# Patient Record
Sex: Female | Born: 1975 | Race: Black or African American | Hispanic: No | Marital: Single | Smoking: Never smoker
Health system: Southern US, Community
[De-identification: ages and names within clinical notes are randomized; demographics above are authoritative.]

## PROBLEM LIST (undated history)

## (undated) DIAGNOSIS — M869 Osteomyelitis, unspecified: Secondary | ICD-10-CM

## (undated) DIAGNOSIS — F329 Major depressive disorder, single episode, unspecified: Secondary | ICD-10-CM

## (undated) DIAGNOSIS — F419 Anxiety disorder, unspecified: Secondary | ICD-10-CM

## (undated) DIAGNOSIS — M722 Plantar fascial fibromatosis: Secondary | ICD-10-CM

## (undated) DIAGNOSIS — Z9889 Other specified postprocedural states: Secondary | ICD-10-CM

## (undated) DIAGNOSIS — T7840XA Allergy, unspecified, initial encounter: Secondary | ICD-10-CM

## (undated) DIAGNOSIS — F1910 Other psychoactive substance abuse, uncomplicated: Secondary | ICD-10-CM

## (undated) DIAGNOSIS — M797 Fibromyalgia: Secondary | ICD-10-CM

## (undated) DIAGNOSIS — F4310 Post-traumatic stress disorder, unspecified: Secondary | ICD-10-CM

## (undated) DIAGNOSIS — M1990 Unspecified osteoarthritis, unspecified site: Secondary | ICD-10-CM

## (undated) DIAGNOSIS — N83209 Unspecified ovarian cyst, unspecified side: Secondary | ICD-10-CM

## (undated) DIAGNOSIS — R112 Nausea with vomiting, unspecified: Secondary | ICD-10-CM

## (undated) DIAGNOSIS — F32A Depression, unspecified: Secondary | ICD-10-CM

## (undated) DIAGNOSIS — K589 Irritable bowel syndrome without diarrhea: Secondary | ICD-10-CM

## (undated) DIAGNOSIS — K219 Gastro-esophageal reflux disease without esophagitis: Secondary | ICD-10-CM

## (undated) DIAGNOSIS — G43909 Migraine, unspecified, not intractable, without status migrainosus: Secondary | ICD-10-CM

## (undated) DIAGNOSIS — D219 Benign neoplasm of connective and other soft tissue, unspecified: Secondary | ICD-10-CM

## (undated) DIAGNOSIS — R011 Cardiac murmur, unspecified: Secondary | ICD-10-CM

## (undated) HISTORY — PX: WISDOM TOOTH EXTRACTION: SHX21

## (undated) HISTORY — DX: Benign neoplasm of connective and other soft tissue, unspecified: D21.9

## (undated) HISTORY — DX: Major depressive disorder, single episode, unspecified: F32.9

## (undated) HISTORY — DX: Fibromyalgia: M79.7

## (undated) HISTORY — PX: ADENOIDECTOMY: SUR15

## (undated) HISTORY — DX: Unspecified ovarian cyst, unspecified side: N83.209

## (undated) HISTORY — DX: Migraine, unspecified, not intractable, without status migrainosus: G43.909

## (undated) HISTORY — DX: Other psychoactive substance abuse, uncomplicated: F19.10

## (undated) HISTORY — DX: Plantar fascial fibromatosis: M72.2

## (undated) HISTORY — DX: Gastro-esophageal reflux disease without esophagitis: K21.9

## (undated) HISTORY — DX: Depression, unspecified: F32.A

## (undated) HISTORY — DX: Anxiety disorder, unspecified: F41.9

## (undated) HISTORY — DX: Post-traumatic stress disorder, unspecified: F43.10

## (undated) HISTORY — DX: Irritable bowel syndrome, unspecified: K58.9

## (undated) HISTORY — DX: Unspecified osteoarthritis, unspecified site: M19.90

## (undated) HISTORY — PX: TONSILLECTOMY: SUR1361

## (undated) HISTORY — DX: Osteomyelitis, unspecified: M86.9

## (undated) HISTORY — DX: Allergy, unspecified, initial encounter: T78.40XA

## (undated) HISTORY — DX: Cardiac murmur, unspecified: R01.1

## (undated) HISTORY — PX: DILATION AND CURETTAGE OF UTERUS: SHX78

---

## 1999-08-06 DIAGNOSIS — N83209 Unspecified ovarian cyst, unspecified side: Secondary | ICD-10-CM

## 1999-08-06 HISTORY — DX: Unspecified ovarian cyst, unspecified side: N83.209

## 2011-10-27 ENCOUNTER — Emergency Department (HOSPITAL_COMMUNITY): Payer: BC Managed Care – PPO

## 2011-10-27 ENCOUNTER — Emergency Department (HOSPITAL_COMMUNITY)
Admission: EM | Admit: 2011-10-27 | Discharge: 2011-10-28 | Disposition: A | Discharge status: Home / Self Care | Payer: BC Managed Care – PPO | Attending: Emergency Medicine | Admitting: Emergency Medicine

## 2011-10-27 ENCOUNTER — Encounter (HOSPITAL_COMMUNITY): Payer: Self-pay | Admitting: *Deleted

## 2011-10-27 DIAGNOSIS — R059 Cough, unspecified: Secondary | ICD-10-CM | POA: Insufficient documentation

## 2011-10-27 DIAGNOSIS — R112 Nausea with vomiting, unspecified: Secondary | ICD-10-CM | POA: Insufficient documentation

## 2011-10-27 DIAGNOSIS — R05 Cough: Secondary | ICD-10-CM | POA: Insufficient documentation

## 2011-10-27 DIAGNOSIS — R0602 Shortness of breath: Secondary | ICD-10-CM | POA: Insufficient documentation

## 2011-10-27 DIAGNOSIS — J40 Bronchitis, not specified as acute or chronic: Secondary | ICD-10-CM

## 2011-10-27 DIAGNOSIS — Z79899 Other long term (current) drug therapy: Secondary | ICD-10-CM | POA: Insufficient documentation

## 2011-10-27 DIAGNOSIS — J45909 Unspecified asthma, uncomplicated: Secondary | ICD-10-CM | POA: Insufficient documentation

## 2011-10-27 MED ORDER — ONDANSETRON 4 MG PO TBDP
4.0000 mg | ORAL_TABLET | Freq: Once | ORAL | Status: DC
  Filled 2011-10-27: qty 1

## 2011-10-27 NOTE — ED Notes (Signed)
Pt states that she missed work on Friday and went to [facility] because she was having N/V/D. Pt waited 5 hours was not seen and she continued to feel bad over the weekend. Pt came back today due to coughing, generalized body aches.

## 2011-10-27 NOTE — ED Provider Notes (Signed)
History     CSN: [id number]  Arrival date & time 10/27/11  2029   First MD Initiated Contact with Patient 10/27/11 2316      Chief Complaint  Patient presents with  . Influenza    (Consider location/radiation/quality/duration/timing/severity/associated sxs/prior treatment) HPI Comments: Patient presents with 4 days of cough, congestion, nausea, vomiting, subjective fevers and chills. Her symptoms started on Friday when she missed work in [employer] [facility], waited 5 hours to be seen in the left. The nausea and vomiting started over the weekend associated with loose stools.  Eyes any abdominal pain or back pain. She does have chest pain with coughing he did not have chest pain without coughing.  Is nonproductive and dry. She has rhinorrhea, sore throat congestion.  The history is provided by the patient.    Past Medical History  Diagnosis Date  . Asthma     Past Surgical History  Procedure Date  . Tonsillectomy     No family history on file.  History  Substance Use Topics  . Smoking status: Not on file  . Smokeless tobacco: Not on file  . Alcohol Use: No    OB History    Grav Para Term Preterm Abortions TAB SAB Ect Mult Living                  Review of Systems  Constitutional: Positive for fever, activity change and fatigue.  HENT: Positive for congestion, sore throat and rhinorrhea.   Respiratory: Positive for cough and chest tightness. Negative for shortness of breath.   Cardiovascular: Positive for chest pain.  Gastrointestinal: Positive for nausea, vomiting and diarrhea. Negative for abdominal pain.  Genitourinary: Negative for dysuria, vaginal bleeding and vaginal discharge.  Musculoskeletal: Negative for back pain.  Neurological: Negative for headaches.    Allergies  Doxycycline; Latex; and Penicillins  Home Medications   Current Outpatient Rx  Name Route Sig Dispense Refill  . ALBUTEROL SULFATE HFA 108 (90 BASE) MCG/ACT IN AERS Inhalation Inhale 2  puffs into the lungs every 6 (six) hours as needed.    Marland Kitchen ESOMEPRAZOLE MAGNESIUM 40 MG PO CPDR Oral Take 40 mg by mouth daily before breakfast.    . ALBUTEROL SULFATE HFA 108 (90 BASE) MCG/ACT IN AERS Inhalation Inhale 1-2 puffs into the lungs every 6 (six) hours as needed for wheezing. 1 Inhaler 0  . AZITHROMYCIN 250 MG PO TABS Oral Take 1 tablet (250 mg total) by mouth daily. Take first 2 tablets together, then 1 every day until finished. 6 tablet 0    BP 98/61  Pulse 105  Temp(Src) 98.4 F (36.9 C) (Oral)  Resp 16  SpO2 100%  LMP 10/13/2011  Physical Exam  Constitutional: She is oriented to person, place, and time. She appears well-developed and well-nourished. No distress.  HENT:  Head: Normocephalic and atraumatic.  Right Ear: External ear normal.  Left Ear: External ear normal.  Mouth/Throat: Oropharynx is clear and moist. No oropharyngeal exudate.  Eyes: Conjunctivae are normal. Pupils are equal, round, and reactive to light.  Neck: Normal range of motion.       No meningismus  Cardiovascular: Normal rate, regular rhythm and normal heart sounds.   No murmur heard. Pulmonary/Chest: Effort normal and breath sounds normal. No respiratory distress. She has no wheezes.  Abdominal: Soft. There is no tenderness. There is no rebound and no guarding.  Musculoskeletal: Normal range of motion. She exhibits no edema and no tenderness.  Neurological: She is alert and oriented to person,  place, and time. No cranial nerve deficit.  Skin: Skin is warm.    ED Course  Procedures (including critical care time)  Labs Reviewed - No data to display Dg Chest 2 View  10/27/2011  *RADIOLOGY REPORT*  Clinical Data: Cough, shortness of breath and chest pain.  CHEST - 2 VIEW  Comparison: None.  Findings: The lungs are well-aerated and clear.  There is no evidence of focal opacification, pleural effusion or pneumothorax.  The heart is normal in size; the mediastinal contour is within normal limits.   No acute osseous abnormalities are seen.  IMPRESSION: No acute cardiopulmonary process seen.  Original Report Authenticated By: Tonia Ghent, M.D.     1. Bronchitis       MDM  Viral syndrome with cough, congestion, nausea, vomiting.  Nontoxic on exam. No current chest pain or SOB.  Xray negative, lungs clear.  Will treat for bronchitis.        Glynn Octave, MD 10/28/11 [phone]

## 2011-10-27 NOTE — ED Notes (Signed)
Pt has been feeling ill since Wednesday.  Pt has been having congestion, nausea and vomiting and feeling tired.  Pt has been having fever and chills

## 2011-10-28 MED ORDER — ALBUTEROL SULFATE HFA 108 (90 BASE) MCG/ACT IN AERS: 1.0000 | INHALATION_SPRAY | Freq: Four times a day (QID) | RESPIRATORY_TRACT | Status: DC | PRN

## 2011-10-28 MED ORDER — AZITHROMYCIN 250 MG PO TABS: 250.0000 mg | ORAL_TABLET | Freq: Every day | ORAL | Status: AC

## 2011-10-28 NOTE — Discharge Instructions (Signed)
 Bronchitis Bronchitis is the body's way of reacting to injury and/or infection (inflammation) of the bronchi. Bronchi are the air tubes that extend from the windpipe into the lungs. If the inflammation becomes severe, it may cause shortness of breath. CAUSES  Inflammation may be caused by:  A virus.   Germs (bacteria).   Dust.   Allergens.   Pollutants and many other irritants.  The cells lining the bronchial tree are covered with tiny hairs (cilia). These constantly beat upward, away from the lungs, toward the mouth. This keeps the lungs free of pollutants. When these cells become too irritated and are unable to do their job, mucus begins to develop. This causes the characteristic cough of bronchitis. The cough clears the lungs when the cilia are unable to do their job. Without either of these protective mechanisms, the mucus would settle in the lungs. Then you would develop pneumonia. Smoking is a common cause of bronchitis and can contribute to pneumonia. Stopping this habit is the single most important thing you can do to help yourself. TREATMENT   Your caregiver may prescribe an antibiotic if the cough is caused by bacteria. Also, medicines that open up your airways make it easier to breathe. Your caregiver may also recommend or prescribe an expectorant. It will loosen the mucus to be coughed up. Only take over-the-counter or prescription medicines for pain, discomfort, or fever as directed by your caregiver.   Removing whatever causes the problem (smoking, for example) is critical to preventing the problem from getting worse.   Cough suppressants may be prescribed for relief of cough symptoms.   Inhaled medicines may be prescribed to help with symptoms now and to help prevent problems from returning.   For those with recurrent (chronic) bronchitis, there may be a need for steroid medicines.  SEEK IMMEDIATE MEDICAL CARE IF:   During treatment, you develop more pus-like mucus  (purulent sputum).   You have a fever.   Your baby is older than 3 months with a rectal temperature of 102 F (38.9 C) or higher.   Your baby is 51 months old or younger with a rectal temperature of 100.4 F (38 C) or higher.   You become progressively more ill.   You have increased difficulty breathing, wheezing, or shortness of breath.  It is necessary to seek immediate medical care if you are elderly or sick from any other disease. MAKE SURE YOU:   Understand these instructions.   Will watch your condition.   Will get help right away if you are not doing well or get worse.  Document Released: 07/22/2005 Document Revised: 07/11/2011 Document Reviewed: 05/31/2008 [facility] Patient Information 2012 [place], [place].

## 2011-10-28 NOTE — ED Notes (Signed)
Pt able to tolerate fluids. Pt feels better and ready to leave.

## 2012-01-07 ENCOUNTER — Ambulatory Visit: Payer: BC Managed Care – PPO | Admitting: Obstetrics and Gynecology

## 2012-01-07 ENCOUNTER — Encounter: Payer: BC Managed Care – PPO | Admitting: Obstetrics and Gynecology

## 2012-01-17 ENCOUNTER — Ambulatory Visit (INDEPENDENT_AMBULATORY_CARE_PROVIDER_SITE_OTHER): Payer: BC Managed Care – PPO | Admitting: Obstetrics and Gynecology

## 2012-01-17 ENCOUNTER — Encounter: Payer: Self-pay | Admitting: Obstetrics and Gynecology

## 2012-01-17 VITALS — BP 110/78 | HR 80 | Ht 66.0 in | Wt 200.0 lb

## 2012-01-17 DIAGNOSIS — K589 Irritable bowel syndrome without diarrhea: Secondary | ICD-10-CM | POA: Insufficient documentation

## 2012-01-17 DIAGNOSIS — N949 Unspecified condition associated with female genital organs and menstrual cycle: Secondary | ICD-10-CM

## 2012-01-17 DIAGNOSIS — Z113 Encounter for screening for infections with a predominantly sexual mode of transmission: Secondary | ICD-10-CM

## 2012-01-17 DIAGNOSIS — G43909 Migraine, unspecified, not intractable, without status migrainosus: Secondary | ICD-10-CM | POA: Insufficient documentation

## 2012-01-17 DIAGNOSIS — K219 Gastro-esophageal reflux disease without esophagitis: Secondary | ICD-10-CM | POA: Insufficient documentation

## 2012-01-17 DIAGNOSIS — N83209 Unspecified ovarian cyst, unspecified side: Secondary | ICD-10-CM | POA: Insufficient documentation

## 2012-01-17 DIAGNOSIS — F4310 Post-traumatic stress disorder, unspecified: Secondary | ICD-10-CM

## 2012-01-17 DIAGNOSIS — Z124 Encounter for screening for malignant neoplasm of cervix: Secondary | ICD-10-CM

## 2012-01-17 DIAGNOSIS — F32A Depression, unspecified: Secondary | ICD-10-CM | POA: Insufficient documentation

## 2012-01-17 DIAGNOSIS — N946 Dysmenorrhea, unspecified: Secondary | ICD-10-CM

## 2012-01-17 DIAGNOSIS — F329 Major depressive disorder, single episode, unspecified: Secondary | ICD-10-CM | POA: Insufficient documentation

## 2012-01-17 DIAGNOSIS — IMO0002 Reserved for concepts with insufficient information to code with codable children: Secondary | ICD-10-CM

## 2012-01-17 DIAGNOSIS — N898 Other specified noninflammatory disorders of vagina: Secondary | ICD-10-CM

## 2012-01-17 DIAGNOSIS — Z01419 Encounter for gynecological examination (general) (routine) without abnormal findings: Secondary | ICD-10-CM

## 2012-01-17 DIAGNOSIS — N943 Premenstrual tension syndrome: Secondary | ICD-10-CM

## 2012-01-17 LAB — POCT WET PREP (WET MOUNT): Whiff Test: NEGATIVE

## 2012-01-17 LAB — POCT URINE PREGNANCY: Preg Test, Ur: NEGATIVE

## 2012-01-17 NOTE — Progress Notes (Signed)
Color: WHITE Odor: no Itching:yes Thin:no Thick:yes Fever:no Dyspareunia:no Hx PID:no HX YQM:VHQIONGE TRICH Pelvic Pain:no Desires Gc/CT:yes Desires HIV,RPR,HbsAG:yes

## 2012-01-17 NOTE — Progress Notes (Signed)
Regular Periods: yes Mammogram: no  Monthly Breast Ex.: yes Exercise: no  Tetanus < 10 years: no Seatbelts: yes  NI. Bladder Functn.: yes Abuse at home: no  Daily BM's: yes Stressful Work: yes  Healthy Diet: yes Sigmoid-Colonoscopy: 2004   Calcium: no Medical problems this year: VAGINAL DISCHARGE AND DISCOMFORT WITH I/C   LAST [id number]  Contraception: CONDOMS  Mammogram:  NO  PCP: NO  PMH: NOCHANGE  FMH: NOCHANGE  Last Bone Scan: NO

## 2012-01-17 NOTE — Patient Instructions (Signed)
Avoid: - excess soap on genital area (consider using plain oatmeal soap) - use of powder or sprays in genital area - douching - wearing underwear to bed (except with menses) - using more than is directed detergent when washing clothes - tight fitting garments around genital area - excess sugar intake   

## 2012-01-17 NOTE — Progress Notes (Signed)
Subjective:    Carrieann Spielberg is a 36 y.o. female, G2P0020, who presents for an annual exam. The patient reports dyspareunia, primarily on the left side over the last 4 months and heavy vaginal discharge for one month without odor or itching. Pain is pressure-like and also occurs with menses.  Patient wants STD testing. Lastly, patient will have menstrual cramping starting 1 week before menses rated as a 10/10 but will decrease to 5/10 with Ibuprofen 800 mg. Since her D & C in 04/2011 her periods have been lighter (2 tampons daily) but cramps have been worse. PMS symptoms include fatigue, breast tenderness, nausea, vomiting and headache.  Menstrual cycle:   LMP: Patient's last menstrual period was 12/20/2011.  Flow 5 days with tampon change 2/day with 10/10 cramps relieved by Ibuprofen 800 mg (5/10)             Review of Systems Pertinent items are noted in HPI. Denies pelvic pain, urinary tract symptoms, vaginitis symptoms, irregular bleeding, menopausal symptoms, change in bowel habits or rectal bleeding   Objective:    BP 110/78  Pulse 80  Ht 5\' 6"  (1.676 m)  Wt 200 lb (90.719 kg)  BMI 32.28 kg/m2  LMP 12/20/2011  Wt Readings from Last 1 Encounters:  01/17/12 200 lb (90.719 kg)   Body mass index is 32.28 kg/(m^2). General Appearance: Alert, no acute distress HEENT: Grossly normal Neck / Thyroid: Supple, no thyromegaly or cervical adenopathy Lungs: Clear to auscultation bilaterally Back: No CVA tenderness Breast Exam: No masses or nodes.No dimpling, nipple retraction or discharge. Cardiovascular: Regular rate and rhythm.  Gastrointestinal: Soft, non-tender, no masses or organomegaly Pelvic Exam: EGBUS-wnl, vagina-normal rugae, menstrual blood, cervix- without lesions or tenderness, uterus appears normal size shape and consistency, adnexae-no masses or tenderness Lymphatic Exam: Non-palpable nodes in neck, clavicular,  axillary, or inguinal regions  Skin: no rashes or  abnormalities Extremities: no clubbing cyanosis or edema  Neurologic: grossly normal Psychiatric: Alert and oriented  Wet Prep: pH 5.5, whiff-negative, no clue, trich or yeast UPT: negative   Assessment:   Routine GYN Exam Dyspareunia PMS Dysmenorrhea   Plan:  STD testing  Pelvic ultrasound  PAP sent  Considering a career change with or without school  RTO 1 year or prn  Joenathan Sakuma,ELMIRAPA-C

## 2012-01-21 LAB — PAP IG, CT-NG, RFX HPV ASCU

## 2012-02-12 ENCOUNTER — Encounter: Payer: BC Managed Care – PPO | Admitting: Obstetrics and Gynecology

## 2012-02-12 ENCOUNTER — Other Ambulatory Visit: Payer: BC Managed Care – PPO

## 2012-02-17 ENCOUNTER — Encounter: Payer: BC Managed Care – PPO | Admitting: Obstetrics and Gynecology

## 2012-02-17 ENCOUNTER — Other Ambulatory Visit: Payer: BC Managed Care – PPO

## 2014-06-06 ENCOUNTER — Encounter: Payer: Self-pay | Admitting: Obstetrics and Gynecology

## 2014-08-05 HISTORY — PX: TUBAL LIGATION: SHX77

## 2015-11-01 ENCOUNTER — Ambulatory Visit: Payer: Self-pay | Admitting: Obstetrics and Gynecology

## 2015-11-01 NOTE — Progress Notes (Signed)
Patient ID: [facility], female   DOB: [birth year], 40 y.o.   MRN: [id number] Cancelled appt

## 2015-11-03 NOTE — Progress Notes (Signed)
Patient cancelled appt.

## 2016-08-15 ENCOUNTER — Encounter: Payer: Self-pay | Admitting: Obstetrics and Gynecology

## 2016-08-15 ENCOUNTER — Ambulatory Visit (INDEPENDENT_AMBULATORY_CARE_PROVIDER_SITE_OTHER): Payer: Medicaid Other | Admitting: Obstetrics and Gynecology

## 2016-08-15 VITALS — BP 141/88 | Wt 184.0 lb

## 2016-08-15 DIAGNOSIS — N938 Other specified abnormal uterine and vaginal bleeding: Secondary | ICD-10-CM

## 2016-08-15 DIAGNOSIS — N920 Excessive and frequent menstruation with regular cycle: Secondary | ICD-10-CM

## 2016-08-15 NOTE — Progress Notes (Signed)
   [facility] Visit  08/15/16            Patient name: Brooke Eaton MRN [id number]  Date of birth: 01/23/1976  CC & HPI:  Brooke Eaton is a 41 y.o. female presenting today for AUB and menorrhagia. Pt notes that she had a miscarriage in 2012 with a subsequent D&C. Pt has had a BTL and since the procedure her menstrual cycles have been 7 days which is atypical for her. Pt has tried OCP with no relief of her symptoms. Pt denies any other symptoms.   ROS:  ROS  +AUB +menorrhagia   Pertinent History Reviewed:   Reviewed: Significant for fibroid Medical         Past Medical History:  Diagnosis Date  . Acid reflux   . Anxiety and depression   . Asthma   . Fibroid   . Irritable bowel syndrome   . Migraines   . Post-traumatic stress disorder   . PTSD (post-traumatic stress disorder)   . Ruptured cyst of ovary 2001                              Surgical Hx:    Past Surgical History:  Procedure Laterality Date  . ADENOIDECTOMY    . DILATION AND CURETTAGE OF UTERUS    . TONSILLECTOMY    . WISDOM TOOTH EXTRACTION     Medications: Reviewed & Updated - see associated section                       Current Outpatient Prescriptions:  .  albuterol (PROVENTIL HFA;VENTOLIN HFA) 108 (90 BASE) MCG/ACT inhaler, Inhale 2 puffs into the lungs every 6 (six) hours as needed., Disp: , Rfl:  .  esomeprazole (NEXIUM) 40 MG capsule, Take 40 mg by mouth daily before breakfast., Disp: , Rfl:    Social History: Reviewed -  reports that she has never smoked. She has never used smokeless tobacco.  Objective Findings:  Vitals: Blood pressure (!) 141/88, weight 184 lb (83.5 kg), last menstrual period 08/05/2016.  Physical Examination: Pelvic - normal external genitalia, vulva, vagina, cervix, uterus and adnexa,  VULVA: normal appearing vulva with no masses, tenderness or lesions,  VAGINA: normal appearing vagina with normal color and discharge, no lesions,  CERVIX: normal appearing cervix without  discharge or lesions,  UTERUS: uterus is normal size, shape, consistency and nontender,  ADNEXA: normal adnexa in size, nontender and no masses   Assessment & Plan:   A:  1. AUB 2. Menorrhagia  P:  1. Schedule [place] in 1 week, may be interested in Endometrial ablation   By signing my name below, I, Soijett Blue, attest that this documentation has been prepared under the direction and in the presence of Jonnie Kind, MD. Electronically Signed: [place], ED Scribe. 08/15/16. 11:05 AM.  I personally performed the services described in this documentation, which was SCRIBED in my presence. The recorded information has been reviewed and considered accurate. It has been edited as necessary during review. Jonnie Kind, MD

## 2016-08-16 DIAGNOSIS — N920 Excessive and frequent menstruation with regular cycle: Secondary | ICD-10-CM | POA: Insufficient documentation

## 2016-08-23 ENCOUNTER — Other Ambulatory Visit: Payer: Self-pay | Admitting: Obstetrics and Gynecology

## 2016-08-23 ENCOUNTER — Ambulatory Visit (INDEPENDENT_AMBULATORY_CARE_PROVIDER_SITE_OTHER): Payer: Medicaid Other

## 2016-08-23 DIAGNOSIS — N854 Malposition of uterus: Secondary | ICD-10-CM

## 2016-08-23 DIAGNOSIS — N921 Excessive and frequent menstruation with irregular cycle: Secondary | ICD-10-CM

## 2016-08-23 DIAGNOSIS — D250 Submucous leiomyoma of uterus: Secondary | ICD-10-CM

## 2016-08-23 NOTE — Progress Notes (Signed)
PELVIC US TA/TV: retroverted heterogenous uterus,posterior submucosal fibroid distorting the endometrium 1.8 x 1 x 1.8 cm,the fundus is heterogenous,but no discrete mass seen,normal ov's bilat,small amount of simple cul de sac fluid ,left adnexal pain during ultrasound,ovaries appear mobile

## 2016-08-28 ENCOUNTER — Encounter: Payer: Self-pay | Admitting: Obstetrics and Gynecology

## 2016-08-28 ENCOUNTER — Ambulatory Visit (INDEPENDENT_AMBULATORY_CARE_PROVIDER_SITE_OTHER): Payer: Medicaid Other | Admitting: Obstetrics and Gynecology

## 2016-08-28 VITALS — BP 114/80 | HR 80 | Wt 178.8 lb

## 2016-08-28 DIAGNOSIS — N920 Excessive and frequent menstruation with regular cycle: Secondary | ICD-10-CM

## 2016-08-28 DIAGNOSIS — D259 Leiomyoma of uterus, unspecified: Secondary | ICD-10-CM

## 2016-08-28 NOTE — Progress Notes (Signed)
   [facility] Visit  08/28/16          Patient name: Brooke [facility] MRN [id number]  Date of birth: 02-Apr-1976  CC & HPI:  Brooke Eaton is a 41 y.o. female presenting today for follow up for [place] results. Patient is interested in endometrial ablation for fibroid management. She states she initially had 3 day periods but now has 7 day periods since having a BTL. She has tried OCP without relief.   ROS:  ROS +menorrhagia   Pertinent History Reviewed:   Reviewed: Significant for fibroid Medical         Past Medical History:  Diagnosis Date  . Acid reflux   . Anxiety and depression   . Asthma   . Fibroid   . Irritable bowel syndrome   . Migraines   . Post-traumatic stress disorder   . PTSD (post-traumatic stress disorder)   . Ruptured cyst of ovary 2001                              Surgical Hx:    Past Surgical History:  Procedure Laterality Date  . ADENOIDECTOMY    . DILATION AND CURETTAGE OF UTERUS    . TONSILLECTOMY    . WISDOM TOOTH EXTRACTION     Medications: Reviewed & Updated - see associated section                       Current Outpatient Prescriptions:  .  albuterol (PROVENTIL HFA;VENTOLIN HFA) 108 (90 BASE) MCG/ACT inhaler, Inhale 2 puffs into the lungs every 6 (six) hours as needed., Disp: , Rfl:  .  esomeprazole (NEXIUM) 40 MG capsule, Take 40 mg by mouth daily before breakfast., Disp: , Rfl:    Social History: Reviewed -  reports that she has never smoked. She has never used smokeless tobacco.  Objective Findings:  Vitals: Blood pressure 114/80, pulse 80, weight 178 lb 12.8 oz (81.1 kg), last menstrual period 08/05/2016.  Physical Examination: not indicated, discussion only   Assessment & Plan:   A:  1. Menorrhagia 2. Small submucosal fibroid, less than 3 cm 3. She is considering ablation vs IUD  P:  1. Follow up ~2 weeks for either pre-op or IUD placement depending on what patient decides    By signing my name below, I, Sonum Patel, attest  that this documentation has been prepared under the direction and in the presence of Jonnie Kind, MD. Electronically Signed: Sonum Patel, Education administrator. 08/28/16. 4:00 PM.  I personally performed the services described in this documentation, which was SCRIBED in my presence. The recorded information has been reviewed and considered accurate. It has been edited as necessary during review. Jonnie Kind, MD

## 2016-09-02 ENCOUNTER — Ambulatory Visit (INDEPENDENT_AMBULATORY_CARE_PROVIDER_SITE_OTHER): Payer: Medicaid Other | Admitting: Family Medicine

## 2016-09-02 ENCOUNTER — Encounter: Payer: Self-pay | Admitting: Family Medicine

## 2016-09-02 ENCOUNTER — Telehealth: Payer: Self-pay | Admitting: Obstetrics and Gynecology

## 2016-09-02 ENCOUNTER — Telehealth: Payer: Self-pay | Admitting: *Deleted

## 2016-09-02 VITALS — BP 120/78 | HR 80 | Temp 99.0°F | Resp 18 | Ht 66.0 in | Wt 182.0 lb

## 2016-09-02 DIAGNOSIS — F1290 Cannabis use, unspecified, uncomplicated: Secondary | ICD-10-CM

## 2016-09-02 DIAGNOSIS — F419 Anxiety disorder, unspecified: Secondary | ICD-10-CM

## 2016-09-02 DIAGNOSIS — F32A Depression, unspecified: Secondary | ICD-10-CM

## 2016-09-02 DIAGNOSIS — F329 Major depressive disorder, single episode, unspecified: Secondary | ICD-10-CM

## 2016-09-02 DIAGNOSIS — F418 Other specified anxiety disorders: Secondary | ICD-10-CM | POA: Diagnosis not present

## 2016-09-02 DIAGNOSIS — Z7689 Persons encountering health services in other specified circumstances: Secondary | ICD-10-CM

## 2016-09-02 DIAGNOSIS — R079 Chest pain, unspecified: Secondary | ICD-10-CM

## 2016-09-02 DIAGNOSIS — F4310 Post-traumatic stress disorder, unspecified: Secondary | ICD-10-CM | POA: Diagnosis not present

## 2016-09-02 DIAGNOSIS — J45909 Unspecified asthma, uncomplicated: Secondary | ICD-10-CM | POA: Insufficient documentation

## 2016-09-02 DIAGNOSIS — K582 Mixed irritable bowel syndrome: Secondary | ICD-10-CM

## 2016-09-02 DIAGNOSIS — J4520 Mild intermittent asthma, uncomplicated: Secondary | ICD-10-CM

## 2016-09-02 MED ORDER — MEDROXYPROGESTERONE ACETATE 5 MG PO TABS: 5.0000 mg | ORAL_TABLET | Freq: Every day | ORAL | 1 refills | Status: DC

## 2016-09-02 MED ORDER — ALBUTEROL SULFATE HFA 108 (90 BASE) MCG/ACT IN AERS: 2.0000 | INHALATION_SPRAY | Freq: Four times a day (QID) | RESPIRATORY_TRACT | 0 refills | Status: DC | PRN

## 2016-09-02 NOTE — Telephone Encounter (Signed)
Provera 5 mg Rx escribed this date.

## 2016-09-02 NOTE — Progress Notes (Signed)
Chief Complaint  Patient presents with  . Establish Care   New to establish No old records Here with daughter States she wont come unless daughter is welcome Single parent Unemployed Has depression/anxiety/PTSD currently untreated.  Prefers not to take medicine but would like to see a counselor History of acid reflux and IBS and desires referral to gastroenterology  Is up to date with PAP and shots, had TdaP with her daughter an d a flu shot in december Gets a sharp chest pain when anxious - sometimes to the left arm.  No hypertension or known heart dsiease Patient Active Problem List   Diagnosis Date Noted  . Asthma 09/02/2016  . Marijuana use, continuous 09/02/2016  . Menorrhagia with regular cycle 08/16/2016  . Dysmenorrhea 01/17/2012  . PMS (premenstrual syndrome) 01/17/2012  . Anxiety and depression   . Post-traumatic stress disorder   . Ruptured cyst of ovary   . Fibroid   . Irritable bowel syndrome   . Acid reflux     Outpatient Encounter Prescriptions as of 09/02/2016  Medication Sig  .  albuterol (PROVENTIL HFA;VENTOLIN HFA) 108 (90 BASE) MCG/ACT inhaler Inhale 2 puffs into the lungs every 6 (six) hours as needed.   No facility-administered encounter medications on file as of 09/02/2016.     Past Medical History:  Diagnosis Date  . Acid reflux   . Allergy   . Anxiety   . Anxiety and depression   . Asthma   . Depression   . Fibroid   . Heart murmur   . Irritable bowel syndrome   . Migraines   . Post-traumatic stress disorder   . PTSD (post-traumatic stress disorder)   . Ruptured cyst of ovary 2001  . Substance abuse     Past Surgical History:  Procedure Laterality Date  . ADENOIDECTOMY    . DILATION AND CURETTAGE OF UTERUS    . TONSILLECTOMY    . TUBAL LIGATION  2016  . WISDOM TOOTH EXTRACTION      Social History   Social History  . Marital status: Single    Spouse name: n/a  . Number of children: 1  . Years of education: 63    Occupational History  . Not on file.   Social History Main Topics  . Smoking status: Never Smoker  . Smokeless tobacco: Never Used  . Alcohol use Yes     Comment: WINE OCCASIONAL  . Drug use: Yes    Types: Marijuana  . Sexual activity: Yes    Birth control/ protection: Condom, Surgical   Other Topics Concern  . Not on file   Social History Narrative   Single parent   One daughter - almost 2   Not working    Family History  Problem Relation Age of Onset  . Emphysema Maternal Grandmother   . Gout Maternal Grandmother   . Diabetes Maternal Grandmother   . Gout Father   . Alcohol abuse Father   . Arthritis Father   . COPD Father   . Depression Father   . Drug abuse Father   . Hyperlipidemia Father   . Hypertension Father   . Gout Mother   . Arthritis Mother     RA  . Diabetes Mother   . Depression Mother   . Heart disease Mother   . Hyperlipidemia Mother   . Hypertension Mother   . Vision loss Mother   . Thyroid disease Maternal Aunt   . Diabetes Maternal Uncle  Review of Systems  Constitutional: Positive for weight loss. Negative for chills and fever.  HENT: Negative for congestion and hearing loss.   Eyes: Negative for blurred vision and pain.  Respiratory: Negative for cough and shortness of breath.   Cardiovascular: Positive for chest pain. Negative for leg swelling.  Gastrointestinal: Positive for constipation, diarrhea and heartburn. Negative for abdominal pain and blood in stool.  Genitourinary: Negative for dysuria and frequency.  Musculoskeletal: Negative for falls, joint pain and myalgias.  Neurological: Negative for dizziness, seizures and headaches.  Psychiatric/Behavioral: Positive for depression and substance abuse. Negative for suicidal ideas. The patient is nervous/anxious and has insomnia.     BP 120/78 (BP Location: Right Arm, Patient Position: Sitting, Cuff Size: Normal)   Pulse 80   Temp 99 F (37.2 C) (Oral)   Resp 18   Ht 5'  6" (1.676 m)   Wt 182 lb (82.6 kg)   LMP 09/02/2016 (Exact Date)   SpO2 99%   BMI 29.38 kg/m   Physical Exam  Constitutional: She is oriented to person, place, and time. She appears well-developed and well-nourished. No distress.  HENT:  Head: Normocephalic and atraumatic.  Right Ear: External ear normal.  Left Ear: External ear normal.  Mouth/Throat: Oropharynx is clear and moist.  Eyes: Conjunctivae are normal. Pupils are equal, round, and reactive to light.  Neck: Normal range of motion. Neck supple. No thyromegaly present.  Cardiovascular: Normal rate, regular rhythm and normal heart sounds.   Pulmonary/Chest: Effort normal and breath sounds normal. No respiratory distress.  Musculoskeletal: Normal range of motion. She exhibits no edema.  Lymphadenopathy:    She has no cervical adenopathy.  Neurological: She is alert and oriented to person, place, and time.  Gait normal  Skin: Skin is warm and dry.  Psychiatric: Her behavior is normal. Thought content normal.  Mild defiance - opiniated  Nursing note and vitals reviewed. chest wall nontender  ASSESSMENT/PLAN:   1. Post-traumatic stress disorder * - Ambulatory referral to Psychology  2. Anxiety and depression * - Ambulatory referral to Psychology  3. Mild intermittent asthma without complication * 4. Chest pain at rest * - DG Chest 2 View; Future - COMPLETE METABOLIC PANEL WITH GFR - Hemoglobin A1c - TSH - Lipid panel - CBC  5. Irritable bowel syndrome with both constipation and diarrhea * - Ambulatory referral to Gastroenterology  6. Encounter to establish care with new doctor *   Patient Instructions  Need old records  Come back for physical and EKG  Need chest x ray and blood tests  I have referred to gastroenterology and to psychology     Raylene Everts, MD

## 2016-09-02 NOTE — Telephone Encounter (Signed)
Called patient to inform her prescription was sent to pharmacy.

## 2016-09-02 NOTE — Patient Instructions (Addendum)
Need old records  Come back for physical and EKG  Need chest x ray and blood tests  I have referred to gastroenterology and to psychology

## 2016-09-02 NOTE — Telephone Encounter (Signed)
Pt came into the office stating that she does not want to do the IUD and she stated that she is on her period and wanted for the Dr. To call in her a medication. She states that she was told to do so. Please contact pt

## 2016-09-02 NOTE — Telephone Encounter (Signed)
Patient called stating is not wanting to get IUD but wants to proceed with the ablation. Pt started her period Saturday and needs the medication you were going to prescribe to prepare for ablation. Please advise.

## 2016-09-04 ENCOUNTER — Encounter: Payer: Self-pay | Admitting: Family Medicine

## 2016-09-04 LAB — COMPLETE METABOLIC PANEL WITH GFR
ALBUMIN: 3.9 g/dL (ref 3.6–5.1)
ALT: 9 U/L (ref 6–29)
AST: 12 U/L (ref 10–30)
Alkaline Phosphatase: 49 U/L (ref 33–115)
BILIRUBIN TOTAL: 0.3 mg/dL (ref 0.2–1.2)
BUN: 12 mg/dL (ref 7–25)
CO2: 28 mmol/L (ref 20–31)
CREATININE: 0.87 mg/dL (ref 0.50–1.10)
Calcium: 8.8 mg/dL (ref 8.6–10.2)
Chloride: 108 mmol/L (ref 98–110)
GFR, EST NON AFRICAN AMERICAN: 84 mL/min (ref >=60)
GLUCOSE: 62 mg/dL — AB (ref 65–99)
Potassium: 3.9 mmol/L (ref 3.5–5.3)
Sodium: 141 mmol/L (ref 135–146)
TOTAL PROTEIN: 6.7 g/dL (ref 6.1–8.1)

## 2016-09-04 LAB — TSH: TSH: 1.02 m[IU]/L

## 2016-09-04 LAB — CBC
HEMATOCRIT: 36.8 % (ref 35.0–45.0)
Hemoglobin: 11.7 g/dL (ref 11.7–15.5)
MCH: 28.8 pg (ref 27.0–33.0)
MCHC: 31.8 g/dL — AB (ref 32.0–36.0)
MCV: 90.6 fL (ref 80.0–100.0)
MPV: 9.3 fL (ref 7.5–12.5)
Platelets: 286 10*3/uL (ref 140–400)
RBC: 4.06 MIL/uL (ref 3.80–5.10)
RDW: 13.9 % (ref 11.0–15.0)
WBC: 4.8 10*3/uL (ref 3.8–10.8)

## 2016-09-04 LAB — LIPID PANEL
CHOL/HDL RATIO: 3.7 ratio (ref <5.0)
Cholesterol: 154 mg/dL (ref <200)
HDL: 42 mg/dL — AB (ref >50)
LDL Cholesterol: 102 mg/dL — ABNORMAL HIGH (ref <100)
Triglycerides: 49 mg/dL (ref <150)
VLDL: 10 mg/dL (ref <30)

## 2016-09-04 LAB — HEMOGLOBIN A1C
Hgb A1c MFr Bld: 5.4 % (ref <5.7)
MEAN PLASMA GLUCOSE: 108 mg/dL

## 2016-09-12 ENCOUNTER — Encounter: Payer: Self-pay | Admitting: Family Medicine

## 2016-09-12 ENCOUNTER — Other Ambulatory Visit: Payer: Self-pay

## 2016-09-12 ENCOUNTER — Ambulatory Visit (INDEPENDENT_AMBULATORY_CARE_PROVIDER_SITE_OTHER): Payer: Medicaid Other | Admitting: Family Medicine

## 2016-09-12 VITALS — BP 112/64 | HR 92 | Temp 99.7°F | Resp 16 | Ht 66.0 in | Wt 185.0 lb

## 2016-09-12 DIAGNOSIS — R079 Chest pain, unspecified: Secondary | ICD-10-CM

## 2016-09-12 DIAGNOSIS — K219 Gastro-esophageal reflux disease without esophagitis: Secondary | ICD-10-CM

## 2016-09-12 DIAGNOSIS — Z0000 Encounter for general adult medical examination without abnormal findings: Secondary | ICD-10-CM

## 2016-09-12 NOTE — Progress Notes (Signed)
Chief Complaint  Patient presents with  . Annual Exam   Sees Dr Glo Herring for GYN Here to follow up on intermittent  Chest pain Her exam and labs and EKG are normal - she does have acid reflux and epigastric tenderness but does not want to go on any medicine.   She has a GI consult pending at her request Recent labs reviewed They are normal or as expected Shots up to date  Patient Active Problem List   Diagnosis Date Noted  . Asthma 09/02/2016  . Marijuana use, continuous 09/02/2016  . Menorrhagia with regular cycle 08/16/2016  . Dysmenorrhea 01/17/2012  . PMS (premenstrual syndrome) 01/17/2012  . Anxiety and depression   . Post-traumatic stress disorder   . Ruptured cyst of ovary   . Fibroid   . Irritable bowel syndrome   . Acid reflux     Outpatient Encounter Prescriptions as of 09/12/2016  Medication Sig  . albuterol (PROVENTIL HFA;VENTOLIN HFA) 108 (90 BASE) MCG/ACT inhaler Inhale 1-2 puffs into the lungs every 6 (six) hours as needed for wheezing.  . medroxyPROGESTERone (PROVERA) 5 MG tablet Take 1 tablet (5 mg total) by mouth daily. One tablet daily x 2 weeks. Repeat as directed   No facility-administered encounter medications on file as of 09/12/2016.     Allergies  Allergen Reactions  . Doxycycline Nausea And Vomiting    Cannot tolerate oral doxycycline   . Flagyl [Metronidazole] Itching  . Latex Itching  . Penicillins Itching    Review of Systems  Constitutional: Positive for appetite change. Negative for activity change and unexpected weight change.  HENT: Negative.  Negative for congestion and dental problem.   Eyes: Negative.  Negative for visual disturbance.  Respiratory: Negative.  Negative for cough and shortness of breath.   Cardiovascular: Positive for chest pain.       Non cardiac  Gastrointestinal: Positive for abdominal pain. Negative for blood in stool, constipation, diarrhea and nausea.       Heartburn, epigastric pain  Genitourinary:  Positive for menstrual problem. Negative for difficulty urinating and dysuria.  Musculoskeletal: Negative for arthralgias and back pain.  Skin: Negative for color change and pallor.  Neurological: Negative for dizziness and facial asymmetry.  Psychiatric/Behavioral: Negative for dysphoric mood. Suicidal ideas: ANN. The patient is not nervous/anxious.     BP 112/64 (BP Location: Right Arm, Patient Position: Sitting, Cuff Size: Normal)   Pulse 92   Temp 99.7 F (37.6 C) (Temporal)   Resp 16   Ht 5\' 6"  (1.676 m)   Wt 185 lb (83.9 kg)   LMP 09/02/2016 (Exact Date)   SpO2 99%   BMI 29.86 kg/m   Physical Exam BP 112/64 (BP Location: Right Arm, Patient Position: Sitting, Cuff Size: Normal)   Pulse 92   Temp 99.7 F (37.6 C) (Temporal)   Resp 16   Ht 5\' 6"  (1.676 m)   Wt 185 lb (83.9 kg)   LMP 09/02/2016 (Exact Date)   SpO2 99%   BMI 29.86 kg/m   General Appearance:    Alert, cooperative, no distress, appears stated age  Head:    Normocephalic, without obvious abnormality, atraumatic  Eyes:    PERRL, conjunctiva/corneas clear, EOM's intact, fundi    benign, both eyes  Ears:    Normal TM's and external ear canals, both ears  Nose:   Nares normal, septum midline, mucosa normal, no drainage    or sinus tenderness  Throat:   Lips, mucosa, and tongue normal;  teeth and gums normal  Neck:   Supple, symmetrical, trachea midline, no adenopathy;    thyroid:  no enlargement/tenderness/nodules  Back:     Symmetric, no curvature, ROM normal, no CVA tenderness  Lungs:     Clear to auscultation bilaterally, respirations unlabored  Chest Wall:    No tenderness or deformity   Heart:    Regular rate and rhythm, S1 and S2 normal, no murmur, rub   or gallop  Breast Exam:    No tenderness, masses, or nipple abnormality  Abdomen:     Soft, non-tender lower quadrants, epigastrium tender, bowel sounds active all four quadrants,    no masses, no organomegaly  Extremities:   Extremities normal,  atraumatic, no cyanosis or edema  Pulses:   2+ and symmetric all extremities  Skin:   Skin color, texture, turgor normal, no rashes or lesions.  Body piercing above left breast  Lymph nodes:   Cervical, supraclavicular, and axillary nodes normal  Neurologic:   Normal strength, sensation and reflexes    throughout    ASSESSMENT/PLAN:  Annual physical exam  Chest pain at rest  Gastroesophageal reflux disease without esophagitis     Patient Instructions  See me yearly Call for problems   Gastroesophageal Reflux Disease, Adult Introduction Normally, food travels down the esophagus and stays in the stomach to be digested. If a person has gastroesophageal reflux disease (GERD), food and stomach acid move back up into the esophagus. When this happens, the esophagus becomes sore and swollen (inflamed). Over time, GERD can make small holes (ulcers) in the lining of the esophagus. Follow these instructions at home: Diet  Follow a diet as told by your doctor. You may need to avoid foods and drinks such as:  Coffee and tea (with or without caffeine).  Drinks that contain alcohol.  Energy drinks and sports drinks.  Carbonated drinks or sodas.  Chocolate and cocoa.  Peppermint and mint flavorings.  Garlic and onions.  Horseradish.  Spicy and acidic foods, such as peppers, chili powder, curry powder, vinegar, hot sauces, and BBQ sauce.  Citrus fruit juices and citrus fruits, such as oranges, lemons, and limes.  Tomato-based foods, such as red sauce, chili, salsa, and pizza with red sauce.  Fried and fatty foods, such as donuts, french fries, potato chips, and high-fat dressings.  High-fat meats, such as hot dogs, rib eye steak, sausage, ham, and bacon.  High-fat dairy items, such as whole milk, butter, and cream cheese.  Eat small meals often. Avoid eating large meals.  Avoid drinking large amounts of liquid with your meals.  Avoid eating meals during the 2-3 hours  before bedtime.  Avoid lying down right after you eat.  Do not exercise right after you eat. General instructions  Pay attention to any changes in your symptoms.  Take over-the-counter and prescription medicines only as told by your doctor. Do not take aspirin, ibuprofen, or other NSAIDs unless your doctor says it is okay.  Do not use any tobacco products, including cigarettes, chewing tobacco, and e-cigarettes. If you need help quitting, ask your doctor.  Wear loose clothes. Do not wear anything tight around your waist.  Raise (elevate) the head of your bed about 6 inches (15 cm).  Try to lower your stress. If you need help doing this, ask your doctor.  If you are overweight, lose an amount of weight that is healthy for you. Ask your doctor about a safe weight loss goal.  Keep all follow-up visits as told  by your doctor. This is important. Contact a doctor if:  You have new symptoms.  You lose weight and you do not know why it is happening.  You have trouble swallowing, or it hurts to swallow.  You have wheezing or a cough that keeps happening.  Your symptoms do not get better with treatment.  You have a hoarse voice. Get help right away if:  You have pain in your arms, neck, jaw, teeth, or back.  You feel sweaty, dizzy, or light-headed.  You have chest pain or shortness of breath.  You throw up (vomit) and your throw up looks like blood or coffee grounds.  You pass out (faint).  Your poop (stool) is bloody or black.  You cannot swallow, drink, or eat. This information is not intended to replace advice given to you by your health care provider. Make sure you discuss any questions you have with your health care provider. Document Released: 01/08/2008 Document Revised: 12/28/2015 Document Reviewed: 11/16/2014  2017 Elsevier     Raylene Everts, MD

## 2016-09-12 NOTE — Patient Instructions (Addendum)
See me yearly Call for problems   Gastroesophageal Reflux Disease, Adult Introduction Normally, food travels down the esophagus and stays in the stomach to be digested. If a person has gastroesophageal reflux disease (GERD), food and stomach acid move back up into the esophagus. When this happens, the esophagus becomes sore and swollen (inflamed). Over time, GERD can make small holes (ulcers) in the lining of the esophagus. Follow these instructions at home: Diet  Follow a diet as told by your doctor. You may need to avoid foods and drinks such as:  Coffee and tea (with or without caffeine).  Drinks that contain alcohol.  Energy drinks and sports drinks.  Carbonated drinks or sodas.  Chocolate and cocoa.  Peppermint and mint flavorings.  Garlic and onions.  Horseradish.  Spicy and acidic foods, such as peppers, chili powder, curry powder, vinegar, hot sauces, and BBQ sauce.  Citrus fruit juices and citrus fruits, such as oranges, lemons, and limes.  Tomato-based foods, such as red sauce, chili, salsa, and pizza with red sauce.  Fried and fatty foods, such as donuts, french fries, potato chips, and high-fat dressings.  High-fat meats, such as hot dogs, rib eye steak, sausage, ham, and bacon.  High-fat dairy items, such as whole milk, butter, and cream cheese.  Eat small meals often. Avoid eating large meals.  Avoid drinking large amounts of liquid with your meals.  Avoid eating meals during the 2-3 hours before bedtime.  Avoid lying down right after you eat.  Do not exercise right after you eat. General instructions  Pay attention to any changes in your symptoms.  Take over-the-counter and prescription medicines only as told by your doctor. Do not take aspirin, ibuprofen, or other NSAIDs unless your doctor says it is okay.  Do not use any tobacco products, including cigarettes, chewing tobacco, and e-cigarettes. If you need help quitting, ask your doctor.  Wear  loose clothes. Do not wear anything tight around your waist.  Raise (elevate) the head of your bed about 6 inches (15 cm).  Try to lower your stress. If you need help doing this, ask your doctor.  If you are overweight, lose an amount of weight that is healthy for you. Ask your doctor about a safe weight loss goal.  Keep all follow-up visits as told by your doctor. This is important. Contact a doctor if:  You have new symptoms.  You lose weight and you do not know why it is happening.  You have trouble swallowing, or it hurts to swallow.  You have wheezing or a cough that keeps happening.  Your symptoms do not get better with treatment.  You have a hoarse voice. Get help right away if:  You have pain in your arms, neck, jaw, teeth, or back.  You feel sweaty, dizzy, or light-headed.  You have chest pain or shortness of breath.  You throw up (vomit) and your throw up looks like blood or coffee grounds.  You pass out (faint).  Your poop (stool) is bloody or black.  You cannot swallow, drink, or eat. This information is not intended to replace advice given to you by your health care provider. Make sure you discuss any questions you have with your health care provider. Document Released: 01/08/2008 Document Revised: 12/28/2015 Document Reviewed: 11/16/2014  2017 Elsevier

## 2016-09-16 ENCOUNTER — Encounter: Payer: Self-pay | Admitting: Obstetrics and Gynecology

## 2016-09-16 ENCOUNTER — Ambulatory Visit: Payer: Medicaid Other | Admitting: Obstetrics and Gynecology

## 2016-09-16 ENCOUNTER — Ambulatory Visit (INDEPENDENT_AMBULATORY_CARE_PROVIDER_SITE_OTHER): Payer: Medicaid Other | Admitting: Obstetrics and Gynecology

## 2016-09-16 VITALS — BP 122/80 | HR 90 | Ht 66.0 in | Wt 187.4 lb

## 2016-09-16 DIAGNOSIS — N946 Dysmenorrhea, unspecified: Secondary | ICD-10-CM

## 2016-09-16 DIAGNOSIS — D259 Leiomyoma of uterus, unspecified: Secondary | ICD-10-CM

## 2016-09-16 DIAGNOSIS — Z113 Encounter for screening for infections with a predominantly sexual mode of transmission: Secondary | ICD-10-CM

## 2016-09-16 DIAGNOSIS — N920 Excessive and frequent menstruation with regular cycle: Secondary | ICD-10-CM | POA: Diagnosis not present

## 2016-09-16 NOTE — Progress Notes (Signed)
Patient ID: Brooke Eaton, female   DOB: Jan 26, 1976, 41 y.o.   MRN: [id number]  Preoperative History and Physical  Brooke Eaton is a 41 y.o. G2P0020 here for surgical management of menorrhagia, submucosal fibroid <3cm. She notes an intermittent area of swelling that occurs before and after periods.    Proposed surgery: endometrial ablation   Past Medical History:  Diagnosis Date  . Acid reflux   . Allergy   . Anxiety   . Anxiety and depression   . Asthma   . Depression   . Fibroid   . Heart murmur   . Irritable bowel syndrome   . Migraines   . Post-traumatic stress disorder   . PTSD (post-traumatic stress disorder)   . Ruptured cyst of ovary 2001  . Substance abuse    Past Surgical History:  Procedure Laterality Date  . ADENOIDECTOMY    . DILATION AND CURETTAGE OF UTERUS    . TONSILLECTOMY    . TUBAL LIGATION  2016  . WISDOM TOOTH EXTRACTION     OB History  Gravida Para Term Preterm AB Living  2 0     2 0  SAB TAB Ectopic Multiple Live Births  2            # Outcome Date GA Lbr Len/2nd Weight Sex Delivery Anes PTL Lv  2 SAB           1 SAB             Obstetric Comments  PT HAD ONE STILL BIRTH  Patient denies any other pertinent gynecologic issues.   Current Outpatient Prescriptions on File Prior to Visit  Medication Sig Dispense Refill  . albuterol (PROVENTIL HFA;VENTOLIN HFA) 108 (90 BASE) MCG/ACT inhaler Inhale 1-2 puffs into the lungs every 6 (six) hours as needed for wheezing. 1 Inhaler 0  . medroxyPROGESTERone (PROVERA) 5 MG tablet Take 1 tablet (5 mg total) by mouth daily. One tablet daily x 2 weeks. Repeat as directed 30 tablet 1   No current facility-administered medications on file prior to visit.    Allergies  Allergen Reactions  . Doxycycline Nausea And Vomiting    Cannot tolerate oral doxycycline   . Flagyl [Metronidazole] Itching  . Latex Itching  . Penicillins Itching    Social History:   reports that she has never smoked. She has never used  smokeless tobacco. She reports that she drinks alcohol. She reports that she uses drugs, including Marijuana.  Family History  Problem Relation Age of Onset  . Emphysema Maternal Grandmother   . Gout Maternal Grandmother   . Diabetes Maternal Grandmother   . Gout Father   . Alcohol abuse Father   . Arthritis Father   . COPD Father   . Depression Father   . Drug abuse Father   . Hyperlipidemia Father   . Hypertension Father   . Gout Mother   . Arthritis Mother     RA  . Diabetes Mother   . Depression Mother   . Heart disease Mother   . Hyperlipidemia Mother   . Hypertension Mother   . Vision loss Mother   . Thyroid disease Maternal Aunt   . Diabetes Maternal Uncle     Review of Systems: Noncontributory  PHYSICAL EXAM: Blood pressure 122/80, pulse 90, height 5\' 6"  (1.676 m), weight 187 lb 6.4 oz (85 kg), last menstrual period 09/02/2016. General appearance - alert, well appearing, and in no distress Chest - clear to auscultation, no wheezes, rales or  rhonchi, symmetric air entry Heart - normal rate and regular rhythm Abdomen - soft, nontender, nondistended, no masses or organomegaly Pelvic -  VULVA: normal appearing vulva with no masses, tenderness or lesions, on left buttock cheek at 5 o'clock 1 cm just inside thigh crease, there is a firm knot  VAGINA: normal appearing vagina with normal color and discharge, no lesions,  CERVIX: normal appearing cervix without discharge or lesions,  UTERUS: uterus is normal size, shape, consistency and nontender, retroverted uterus, upper limits of normal size  ADNEXA: normal adnexa in size, nontender and no masses.  Extremities - peripheral pulses normal, no pedal edema, no clubbing or cyanosis  GC/CHL collected   Labs: Results for orders placed or performed in visit on 09/02/16 (from the past 336 hour(s))  COMPLETE METABOLIC PANEL WITH GFR   Collection Time: 09/02/16  4:24 PM  Result Value Ref Range   Sodium 141 135 - 146 mmol/L    Potassium 3.9 3.5 - 5.3 mmol/L   Chloride 108 98 - 110 mmol/L   CO2 28 20 - 31 mmol/L   Glucose, Bld 62 (L) 65 - 99 mg/dL   BUN 12 7 - 25 mg/dL   Creat 0.87 0.50 - 1.10 mg/dL   Total Bilirubin 0.3 0.2 - 1.2 mg/dL   Alkaline Phosphatase 49 33 - 115 U/L   AST 12 10 - 30 U/L   ALT 9 6 - 29 U/L   Total Protein 6.7 6.1 - 8.1 g/dL   Albumin 3.9 3.6 - 5.1 g/dL   Calcium 8.8 8.6 - 10.2 mg/dL   GFR, Est African American >89 >=60 mL/min   GFR, Est Non African American 84 >=60 mL/min  Hemoglobin A1c   Collection Time: 09/02/16  4:24 PM  Result Value Ref Range   Hgb A1c MFr Bld 5.4 <5.7 %   Mean Plasma Glucose 108 mg/dL  TSH   Collection Time: 09/02/16  4:24 PM  Result Value Ref Range   TSH 1.02 mIU/L  Lipid panel   Collection Time: 09/02/16  4:24 PM  Result Value Ref Range   Cholesterol 154 <200 mg/dL   Triglycerides 49 <150 mg/dL   HDL 42 (L) >50 mg/dL   Total CHOL/HDL Ratio 3.7 <5.0 Ratio   VLDL 10 <30 mg/dL   LDL Cholesterol 102 (H) <100 mg/dL  CBC   Collection Time: 09/02/16  4:24 PM  Result Value Ref Range   WBC 4.8 3.8 - 10.8 K/uL   RBC 4.06 3.80 - 5.10 MIL/uL   Hemoglobin 11.7 11.7 - 15.5 g/dL   HCT 36.8 35.0 - 45.0 %   MCV 90.6 80.0 - 100.0 fL   MCH 28.8 27.0 - 33.0 pg   MCHC 31.8 (L) 32.0 - 36.0 g/dL   RDW 13.9 11.0 - 15.0 %   Platelets 286 140 - 400 K/uL   MPV 9.3 7.5 - 12.5 fL    Imaging Studies: US Transvaginal Non-ob  Result Date: 09/04/2016 GYNECOLOGIC SONOGRAM Brooke Eaton is a 41 y.o. G2P0020 LMP 08/05/2016 for a pelvic sonogram for abnormal uterine bleeding. Uterus                      7.6 x 5.6 x 4.2 cm, retroverted heterogenous uterus,posterior submucosal fibroid distorting the endometrium 1.8 x 1 x 1.8 cm,the fundus is heterogenous,but no discrete mass seen. Endometrium          8.1 mm, symmetrical, distorted by posterior submucosal fibroid Right ovary  2.3 x 2.5 x 1.7 cm,  wnl Left ovary                2.9 x 2.6 x 2.3 cm, wnl Small amount of  simple cul de sac fluid Technician Comments: PELVIC US TA/TV: retroverted heterogenous uterus,posterior submucosal fibroid distorting the endometrium 1.8 x 1 x 1.8 cm,the fundus is heterogenous,but no discrete mass seen,normal ov's bilat,EEC 8.1 mm,small amount of simple cul de sac fluid ,left adnexal pain during ultrasound,ovaries appear mobile [employer] 08/23/2016 10:55 AM Clinical Impression and recommendations: I have reviewed the sonogram results above, combined with the patient's current clinical course, below are my impressions and any appropriate recommendations for management based on the sonographic findings. Uterus overall normal volume with endometrium being altered by small myoma Endometrial stripe is normal with distortion by small myoma Both ovaries are normal Unclear if the submucosal myoma is solely, partially or bears any responsibility for the patient's bleeding complaints, however reasonable to assume is partially resposible EURE,LUTHER H   US Pelvis Complete  Result Date: 09/04/2016 GYNECOLOGIC SONOGRAM Brooke Eaton is a 41 y.o. G2P0020 LMP 08/05/2016 for a pelvic sonogram for abnormal uterine bleeding. Uterus                      7.6 x 5.6 x 4.2 cm, retroverted heterogenous uterus,posterior submucosal fibroid distorting the endometrium 1.8 x 1 x 1.8 cm,the fundus is heterogenous,but no discrete mass seen. Endometrium          8.1 mm, symmetrical, distorted by posterior submucosal fibroid Right ovary             2.3 x 2.5 x 1.7 cm,  wnl Left ovary                2.9 x 2.6 x 2.3 cm, wnl Small amount of simple cul de sac fluid Technician Comments: PELVIC US TA/TV: retroverted heterogenous uterus,posterior submucosal fibroid distorting the endometrium 1.8 x 1 x 1.8 cm,the fundus is heterogenous,but no discrete mass seen,normal ov's bilat,EEC 8.1 mm,small amount of simple cul de sac fluid ,left adnexal pain during ultrasound,ovaries appear mobile [employer] 08/23/2016 10:55 AM Clinical  Impression and recommendations: I have reviewed the sonogram results above, combined with the patient's current clinical course, below are my impressions and any appropriate recommendations for management based on the sonographic findings. Uterus overall normal volume with endometrium being altered by small myoma Endometrial stripe is normal with distortion by small myoma Both ovaries are normal Unclear if the submucosal myoma is solely, partially or bears any responsibility for the patient's bleeding complaints, however reasonable to assume is partially resposible EURE,LUTHER H    Assessment: Patient Active Problem List   Diagnosis Date Noted  . Asthma 09/02/2016  . Marijuana use, continuous 09/02/2016  . Menorrhagia with regular cycle 08/16/2016  . Dysmenorrhea 01/17/2012  . PMS (premenstrual syndrome) 01/17/2012  . Anxiety and depression   . Post-traumatic stress disorder   . Ruptured cyst of ovary   . Fibroid   . Irritable bowel syndrome   . Acid reflux     Plan: Patient will undergo surgical management with endometrial ablation, excision of gluteal abscess/furuncle.      .mec 09/16/2016 3:15 PM   By signing my name below, I, Hansel Feinstein, attest that this documentation has been prepared under the direction and in the presence of Jonnie Kind, MD. Electronically Signed: Hansel Feinstein, ED Scribe. 09/16/16. 3:16 PM.  I personally performed the services  described in this documentation, which was SCRIBED in my presence. The recorded information has been reviewed and considered accurate. It has been edited as necessary during review. Jonnie Kind, MD

## 2016-09-18 LAB — GC/CHLAMYDIA PROBE AMP
CHLAMYDIA, DNA PROBE: NEGATIVE
Neisseria gonorrhoeae by PCR: NEGATIVE

## 2016-09-23 ENCOUNTER — Telehealth: Payer: Self-pay | Admitting: *Deleted

## 2016-09-24 ENCOUNTER — Encounter: Payer: Medicaid Other | Admitting: *Deleted

## 2016-09-24 NOTE — Telephone Encounter (Signed)
Informed patient GC/CHL negative.

## 2016-09-26 NOTE — Patient Instructions (Signed)
Brooke Eaton  09/26/2016     @PREFPERIOPPHARMACY @   Your procedure is scheduled on   10/01/2016  Report to [facility] at  1200  PM.  Call this number if you have problems the morning of surgery:  [phone]   Remember:  Do not eat food or drink liquids after midnight.  Take these medicines the morning of surgery with A SIP OF WATER  Provera. Take your inhaler before you come.   Do not wear jewelry, make-up or nail polish.  Do not wear lotions, powders, or perfumes, or deoderant.  Do not shave 48 hours prior to surgery.  Men may shave face and neck.  Do not bring valuables to the hospital.  [facility] is not responsible for any belongings or valuables.  Contacts, dentures or bridgework may not be worn into surgery.  Leave your suitcase in the car.  After surgery it may be brought to your room.  For patients admitted to the hospital, discharge time will be determined by your treatment team.  Patients discharged the day of surgery will not be allowed to drive home.   Name and phone number of your driver:   family Special instructions:  None  Please read over the following fact sheets that you were given. Anesthesia Post-op Instructions and Care and Recovery After Surgery      Hysteroscopy Hysteroscopy is a procedure used for looking inside the womb (uterus). It may be done for various reasons, including:  To evaluate abnormal bleeding, fibroid (benign, noncancerous) tumors, polyps, scar tissue (adhesions), and possibly cancer of the uterus.  To look for lumps (tumors) and other uterine growths.  To look for causes of why a woman cannot get pregnant (infertility), causes of recurrent loss of pregnancy (miscarriages), or a lost intrauterine device (IUD).  To perform a sterilization by blocking the fallopian tubes from inside the uterus. In this procedure, a thin, flexible tube with a tiny light and camera on the end of it (hysteroscope) is used  to look inside the uterus. A hysteroscopy should be done right after a menstrual period to be sure you are not pregnant. LET [facility] CARE PROVIDER KNOW ABOUT:   Any allergies you have.  All medicines you are taking, including vitamins, herbs, eye drops, creams, and over-the-counter medicines.  Previous problems you or members of your family have had with the use of anesthetics.  Any blood disorders you have.  Previous surgeries you have had.  Medical conditions you have. RISKS AND COMPLICATIONS  Generally, this is a safe procedure. However, as with any procedure, complications can occur. Possible complications include:  Putting a hole in the uterus.  Excessive bleeding.  Infection.  Damage to the cervix.  Injury to other organs.  Allergic reaction to medicines.  Too much fluid used in the uterus for the procedure. BEFORE THE PROCEDURE   Ask your health care provider about changing or stopping any regular medicines.  Do not take aspirin or blood thinners for 1 week before the procedure, or as directed by your health care provider. These can cause bleeding.  If you smoke, do not smoke for 2 weeks before the procedure.  In some cases, a medicine is placed in the cervix the day before the procedure. This medicine makes the cervix have a larger opening (dilate). This makes it easier for the instrument to be inserted into the uterus during the procedure.  Do not eat or drink anything for at least 8 hours before the surgery.  Arrange for someone to take you home after the procedure. PROCEDURE   You may be given a medicine to relax you (sedative). You may also be given one of the following:  A medicine that numbs the area around the cervix (local anesthetic).  A medicine that makes you sleep through the procedure (general anesthetic).  The hysteroscope is inserted through the vagina into the uterus. The camera on the hysteroscope sends a picture to a TV screen. This  gives the surgeon a good view inside the uterus.  During the procedure, air or a liquid is put into the uterus, which allows the surgeon to see better.  Sometimes, tissue is gently scraped from inside the uterus. These tissue samples are sent to a lab for testing. AFTER THE PROCEDURE   If you had a general anesthetic, you may be groggy for a couple hours after the procedure.  If you had a local anesthetic, you will be able to go home as soon as you are stable and feel ready.  You may have some cramping. This normally lasts for a couple days.  You may have bleeding, which varies from light spotting for a few days to menstrual-like bleeding for 3-7 days. This is normal.  If your test results are not back during the visit, make an appointment with your health care provider to find out the results. This information is not intended to replace advice given to you by your health care provider. Make sure you discuss any questions you have with your health care provider. Document Released: 10/28/2000 Document Revised: 05/12/2013 Document Reviewed: 02/18/2013 Elsevier Interactive Patient Education  2017 [place]. Hysteroscopy, Care After Refer to this sheet in the next few weeks. These instructions provide you with information on caring for yourself after your procedure. Your health care provider may also give you more specific instructions. Your treatment has been planned according to current medical practices, but problems sometimes occur. Call your health care provider if you have any problems or questions after your procedure.  WHAT TO EXPECT AFTER THE PROCEDURE After your procedure, it is typical to have the following:  You may have some cramping. This normally lasts for a couple days.  You may have bleeding. This can vary from light spotting for a few days to menstrual-like bleeding for 3-7 days. HOME CARE INSTRUCTIONS  Rest for the first 1-2 days after the procedure.  Only take  over-the-counter or prescription medicines as directed by your health care provider. Do not take aspirin. It can increase the chances of bleeding.  Take showers instead of baths for 2 weeks or as directed by your health care provider.  Do not drive for 24 hours or as directed.  Do not drink alcohol while taking pain medicine.  Do not use tampons, douche, or have sexual intercourse for 2 weeks or until your health care provider says it is okay.  Take your temperature twice a day for 4-5 days. Write it down each time.  Follow your health care provider's advice about diet, exercise, and lifting.  If you develop constipation, you may:  Take a mild laxative if your health care provider approves.  Add bran foods to your diet.  Drink enough fluids to keep your urine clear or pale yellow.  Try to have someone with you or available to you for the first 24-48 hours, especially if you were given a general anesthetic.  Follow  up with your health care provider as directed. SEEK MEDICAL CARE IF:  You feel dizzy or lightheaded.  You feel sick to your stomach (nauseous).  You have abnormal vaginal discharge.  You have a rash.  You have pain that is not controlled with medicine. SEEK IMMEDIATE MEDICAL CARE IF:  You have bleeding that is heavier than a normal menstrual period.  You have a fever.  You have increasing cramps or pain, not controlled with medicine.  You have new belly (abdominal) pain.  You pass out.  You have pain in the tops of your shoulders (shoulder strap areas).  You have shortness of breath. This information is not intended to replace advice given to you by your health care provider. Make sure you discuss any questions you have with your health care provider. Document Released: 05/12/2013 Document Reviewed: 05/12/2013 Elsevier Interactive Patient Education  2017 [place].  Endometrial Ablation Endometrial ablation removes the lining of the uterus  (endometrium). It is usually a same-day, outpatient treatment. Ablation helps avoid major surgery, such as surgery to remove the cervix and uterus (hysterectomy). After endometrial ablation, you will have little or no menstrual bleeding and may not be able to have children. However, if you are premenopausal, you will need to use a reliable method of birth control following the procedure because of the small chance that pregnancy can occur. There are different reasons to have this procedure. These reasons include:  Heavy periods.  Bleeding that is causing anemia.  Irregular bleeding.  Bleeding fibroids on the lining inside the uterus if they are smaller than 3 centimeters. This procedure may not be possible for you if:   You want to have children in the future.   You have severe cramps with your menstrual period.   You have precancerous or cancerous cells in your uterus.   You were recently pregnant.   You have gone through menopause.   You have had major surgery on your uterus, resulting in thinning of the uterine wall. Surgeries may include:  The removal of one or more uterine fibroids (myomectomy).  A cesarean section with a classic (vertical) incision on your uterus. Ask your health care provider what type of cesarean you had. Sometimes the scar on your skin is different than the scar on your uterus. Even if you have had surgery on your uterus, certain types of ablation may still be safe for you. Talk with your health care provider. LET [facility] CARE PROVIDER KNOW ABOUT:  Any allergies you have.  All medicines you are taking, including vitamins, herbs, eye drops, creams, and over-the-counter medicines.  Previous problems you or members of your family have had with the use of anesthetics.  Any blood disorders you have.  Previous surgeries you have had.  Medical conditions you have. RISKS AND COMPLICATIONS  Generally, this is a safe procedure. However, as with any  procedure, complications can occur. Possible complications include:  Perforation of the uterus.  Bleeding.  Infection of the uterus, bladder, or vagina.  Injury to surrounding organs.  An air bubble to the lung (air embolus).  Pregnancy following the procedure.  Failure of the procedure to help the problem, requiring hysterectomy.  Decreased ability to diagnose cancer in the lining of the uterus. BEFORE THE PROCEDURE  The lining of the uterus must be tested to make sure there is no pre-cancerous or cancer cells present.  An ultrasound may be performed to look at the size of the uterus and to check for abnormalities.  Medicines may be given to thin the lining of the uterus. PROCEDURE  During the procedure, your health care provider will use a tool called a resectoscope to help see inside your uterus. There are different ways to remove the lining of your uterus.   Radiofrequency - This method uses a radiofrequency-alternating electric current to remove the lining of the uterus.  Cryotherapy - This method uses extreme cold to freeze the lining of the uterus.  Heated-Free Liquid - This method uses heated salt (saline) solution to remove the lining of the uterus.  Microwave - This method uses high-energy microwaves to heat up the lining of the uterus to remove it.  Thermal balloon - This method involves inserting a catheter with a balloon tip into the uterus. The balloon tip is filled with heated fluid to remove the lining of the uterus. AFTER THE PROCEDURE  After your procedure, do not have sexual intercourse or insert anything into your vagina until permitted by your health care provider. After the procedure, you may experience:  Cramps.  Vaginal discharge.  Frequent urination. This information is not intended to replace advice given to you by your health care provider. Make sure you discuss any questions you have with your health care provider. Document Released: 05/31/2004  Document Revised: 04/12/2015 Document Reviewed: 12/23/2012 Elsevier Interactive Patient Education  2017 [employer]  Dilation and Curettage or Vacuum Curettage Dilation and curettage (D&C) and vacuum curettage are minor procedures. A D&C involves stretching (dilation) the cervix and scraping (curettage) the inside lining of the uterus (endometrium). During a D&C, tissue is gently scraped from the endometrium, starting from the top portion of the uterus down to the lowest part of the uterus (cervix). During a vacuum curettage, the lining and tissue in the uterus are removed with the use of gentle suction. Curettage may be performed to either diagnose or treat a problem. As a diagnostic procedure, curettage is performed to examine tissues from the uterus. A diagnostic curettage may be done if you have:  Irregular bleeding in the uterus.  Bleeding with the development of clots.  Spotting between menstrual periods.  Prolonged menstrual periods or other abnormal bleeding.  Bleeding after menopause.  No menstrual period (amenorrhea).  A change in size and shape of the uterus.  Abnormal endometrial cells discovered during a Pap test. As a treatment procedure, curettage may be performed for the following reasons:  Removal of an IUD (intrauterine device).  Removal of retained placenta after giving birth.  Abortion.  Miscarriage.  Removal of endometrial polyps.  Removal of uncommon types of noncancerous lumps (fibroids). Tell a health care provider about:  Any allergies you have, including allergies to prescribed medicine or latex.  All medicines you are taking, including vitamins, herbs, eye drops, creams, and over-the-counter medicines. This is especially important if you take any blood-thinning medicine. Bring a list of all of your medicines to your appointment.  Any problems you or family members have had with anesthetic medicines.  Any blood disorders you have.  Any  surgeries you have had.  Your medical history and any medical conditions you have.  Whether you are pregnant or may be pregnant.  Recent vaginal infections you have had.  Recent menstrual periods, bleeding problems you have had, and what form of birth control (contraception) you use. What are the risks? Generally, this is a safe procedure. However, problems may occur, including:  Infection.  Heavy vaginal bleeding.  Allergic reactions to medicines.  Damage to the cervix or other  structures or organs.  Development of scar tissue (adhesions) inside the uterus, which can cause abnormal amounts of menstrual bleeding. This may make it harder to get pregnant in the future.  A hole (perforation) or puncture in the uterine wall. This is rare. What happens before the procedure? Staying hydrated  Follow instructions from your health care provider about hydration, which may include:  Up to 2 hours before the procedure - you may continue to drink clear liquids, such as water, clear fruit juice, black coffee, and plain tea. Eating and drinking restrictions  Follow instructions from your health care provider about eating and drinking, which may include:  8 hours before the procedure - stop eating heavy meals or foods such as meat, fried foods, or fatty foods.  6 hours before the procedure - stop eating light meals or foods, such as toast or cereal.  6 hours before the procedure - stop drinking milk or drinks that contain milk.  2 hours before the procedure - stop drinking clear liquids. If your health care provider told you to take your medicine(s) on the day of your procedure, take them with only a sip of water. Medicines  Ask your health care provider about:  Changing or stopping your regular medicines. This is especially important if you are taking diabetes medicines or blood thinners.  Taking medicines such as aspirin and ibuprofen. These medicines can thin your blood. Do not take  these medicines before your procedure if your health care provider instructs you not to.  You may be given antibiotic medicine to help prevent infection. General instructions  For 24 hours before your procedure, do not:  Douche.  Use tampons.  Use medicines, creams, or suppositories in the vagina.  Have sexual intercourse.  You may be given a pregnancy test on the day of the procedure.  Plan to have someone take you home from the hospital or clinic.  You may have a blood or urine sample taken.  If you will be going home right after the procedure, plan to have someone with you for 24 hours. What happens during the procedure?  To reduce your risk of infection:  Your health care team will wash or sanitize their hands.  Your skin will be washed with soap.  An IV tube will be inserted into one of your veins.  You will be given one of the following:  A medicine that numbs the area in and around the cervix (local anesthetic).  A medicine to make you fall asleep (general anesthetic).  You will lie down on your back, with your feet in foot rests (stirrups).  The size and position of your uterus will be checked.  A lubricated instrument (speculum or Sims retractor) will be inserted into the back side of your vagina. The speculum will be used to hold apart the walls of your vagina so your health care provider can see your cervix.  A tool (tenaculum) will be attached to the lip of the cervix to stabilize it.  Your cervix will be softened and dilated. This may be done by:  Taking a medicine.  Having tapered dilators or thin rods (laminaria) or gradual widening instruments (tapered dilators) inserted into your cervix.  A small, sharp, curved instrument (curette) will be used to scrape a small amount of tissue or cells from the endometrium or cervical canal. In some cases, gentle suction is applied with the curette. The curette will then be removed. The cells will be taken to a  lab  for testing. The procedure may vary among health care providers and hospitals. What happens after the procedure?  You may have mild cramping, backache, pain, and light bleeding or spotting. You may pass small blood clots from your vagina.  You may have to wear compression stockings. These stockings help to prevent blood clots and reduce swelling in your legs.  Your blood pressure, heart rate, breathing rate, and blood oxygen level will be monitored until the medicines you were given have worn off. Summary  Dilation and curettage (D&C) involves stretching (dilation) the cervix and scraping (curettage) the inside lining of the uterus (endometrium).  After the procedure, you may have mild cramping, backache, pain, and light bleeding or spotting. You may pass small blood clots from your vagina.  Plan to have someone take you home from the hospital or clinic. This information is not intended to replace advice given to you by your health care provider. Make sure you discuss any questions you have with your health care provider. Document Released: 07/22/2005 Document Revised: 04/07/2016 Document Reviewed: 04/07/2016 Elsevier Interactive Patient Education  2017 [employer]  Dilation and Curettage or Vacuum Curettage, Care After These instructions give you information about caring for yourself after your procedure. Your doctor may also give you more specific instructions. Call your doctor if you have any problems or questions after your procedure. Follow these instructions at home: Activity  Do not drive or use heavy machinery while taking prescription pain medicine.  For 24 hours after your procedure, avoid driving.  Take short walks often, followed by rest periods. Ask your doctor what activities are safe for you. After one or two days, you may be able to return to your normal activities.  Do not lift anything that is heavier than 10 lb (4.5 kg) until your doctor approves.  For at  least 2 weeks, or as long as told by your doctor:  Do not douche.  Do not use tampons.  Do not have sex. General instructions  Take over-the-counter and prescription medicines only as told by your doctor. This is very important if you take blood thinning medicine.  Do not take baths, swim, or use a hot tub until your doctor approves. Take showers instead of baths.  Wear compression stockings as told by your doctor.  It is up to you to get the results of your procedure. Ask your doctor when your results will be ready.  Keep all follow-up visits as told by your doctor. This is important. Contact a doctor if:  You have very bad cramps that get worse or do not get better with medicine.  You have very bad pain in your belly (abdomen).  You cannot drink fluids without throwing up (vomiting).  You get pain in a different part of the area between your belly and thighs (pelvis).  You have bad-smelling discharge from your vagina.  You have a rash. Get help right away if:  You are bleeding a lot from your vagina. A lot of bleeding means soaking more than one sanitary pad in an hour, for 2 hours in a row.  You have clumps of blood (blood clots) coming from your vagina.  You have a fever or chills.  Your belly feels very tender or hard.  You have chest pain.  You have trouble breathing.  You cough up blood.  You feel dizzy.  You feel light-headed.  You pass out (faint).  You have pain in your neck or shoulder area. Summary  Take short walks  often, followed by rest periods. Ask your doctor what activities are safe for you. After one or two days, you may be able to return to your normal activities.  Do not lift anything that is heavier than 10 lb (4.5 kg) until your doctor approves.  Do not take baths, swim, or use a hot tub until your doctor approves. Take showers instead of baths.  Contact your doctor if you have any symptoms of infection, like bad-smelling discharge  from your vagina. This information is not intended to replace advice given to you by your health care provider. Make sure you discuss any questions you have with your health care provider. Document Released: 04/30/2008 Document Revised: 04/08/2016 Document Reviewed: 04/08/2016 Elsevier Interactive Patient Education  2017 [place] Anesthesia, Adult General anesthesia is the use of medicines to make a person "go to sleep" (be unconscious) for a medical procedure. General anesthesia is often recommended when a procedure:  Is long.  Requires you to be still or in an unusual position.  Is major and can cause you to lose blood.  Is impossible to do without general anesthesia. The medicines used for general anesthesia are called general anesthetics. In addition to making you sleep, the medicines:  Prevent pain.  Control your blood pressure.  Relax your muscles. Tell a health care provider about:  Any allergies you have.  All medicines you are taking, including vitamins, herbs, eye drops, creams, and over-the-counter medicines.  Any problems you or family members have had with anesthetic medicines.  Types of anesthetics you have had in the past.  Any bleeding disorders you have.  Any surgeries you have had.  Any medical conditions you have.  Any history of heart or lung conditions, such as heart failure, sleep apnea, or chronic obstructive pulmonary disease (COPD).  Whether you are pregnant or may be pregnant.  Whether you use tobacco, alcohol, marijuana, or street drugs.  Any history of Armed forces logistics/support/administrative officer.  Any history of depression or anxiety. What are the risks? Generally, this is a safe procedure. However, problems may occur, including:  Allergic reaction to anesthetics.  Lung and heart problems.  Inhaling food or liquids from your stomach into your lungs (aspiration).  Injury to nerves.  Waking up during your procedure and being unable to move  (rare).  Extreme agitation or a state of mental confusion (delirium) when you wake up from the anesthetic.  Air in the bloodstream, which can lead to stroke. These problems are more likely to develop if you are having a major surgery or if you have an advanced medical condition. You can prevent some of these complications by answering all of your health care provider's questions thoroughly and by following all pre-procedure instructions. General anesthesia can cause side effects, including:  Nausea or vomiting  A sore throat from the breathing tube.  Feeling cold or shivery.  Feeling tired, washed out, or achy.  Sleepiness or drowsiness.  Confusion or agitation. What happens before the procedure? Staying hydrated  Follow instructions from your health care provider about hydration, which may include:  Up to 2 hours before the procedure - you may continue to drink clear liquids, such as water, clear fruit juice, black coffee, and plain tea. Eating and drinking restrictions  Follow instructions from your health care provider about eating and drinking, which may include:  8 hours before the procedure - stop eating heavy meals or foods such as meat, fried foods, or fatty foods.  6 hours before the procedure -  stop eating light meals or foods, such as toast or cereal.  6 hours before the procedure - stop drinking milk or drinks that contain milk.  2 hours before the procedure - stop drinking clear liquids. Medicines  Ask your health care provider about:  Changing or stopping your regular medicines. This is especially important if you are taking diabetes medicines or blood thinners.  Taking medicines such as aspirin and ibuprofen. These medicines can thin your blood. Do not take these medicines before your procedure if your health care provider instructs you not to.  Taking new dietary supplements or medicines. Do not take these during the week before your procedure unless your  health care provider approves them.  If you are told to take a medicine or to continue taking a medicine on the day of the procedure, take the medicine with sips of water. General instructions   Ask if you will be going home the same day, the following day, or after a longer hospital stay.  Plan to have someone take you home.  Plan to have someone stay with you for the first 24 hours after you leave the hospital or clinic.  For 3-6 weeks before the procedure, try not to use any tobacco products, such as cigarettes, chewing tobacco, and e-cigarettes.  You may brush your teeth on the morning of the procedure, but make sure to spit out the toothpaste. What happens during the procedure?  You will be given anesthetics through a mask and through an IV tube in one of your veins.  You may receive medicine to help you relax (sedative).  As soon as you are asleep, a breathing tube may be used to help you breathe.  An anesthesia specialist will stay with you throughout the procedure. He or she will help keep you comfortable and safe by continuing to give you medicines and adjusting the amount of medicine that you get. He or she will also watch your blood pressure, pulse, and oxygen levels to make sure that the anesthetics do not cause any problems.  If a breathing tube was used to help you breathe, it will be removed before you wake up. The procedure may vary among health care providers and hospitals. What happens after the procedure?  You will wake up, often slowly, after the procedure is complete, usually in a recovery area.  Your blood pressure, heart rate, breathing rate, and blood oxygen level will be monitored until the medicines you were given have worn off.  You may be given medicine to help you calm down if you feel anxious or agitated.  If you will be going home the same day, your health care provider may check to make sure you can stand, drink, and urinate.  Your health care  providers will treat your pain and side effects before you go home.  Do not drive for 24 hours if you received a sedative.  You may:  Feel nauseous and vomit.  Have a sore throat.  Have mental slowness.  Feel cold or shivery.  Feel sleepy.  Feel tired.  Feel sore or achy, even in parts of your body where you did not have surgery. This information is not intended to replace advice given to you by your health care provider. Make sure you discuss any questions you have with your health care provider. Document Released: 10/29/2007 Document Revised: 01/02/2016 Document Reviewed: 07/06/2015 Elsevier Interactive Patient Education  2017 [employer] Anesthesia, Adult, Care After These instructions provide you with information  about caring for yourself after your procedure. Your health care provider may also give you more specific instructions. Your treatment has been planned according to current medical practices, but problems sometimes occur. Call your health care provider if you have any problems or questions after your procedure. What can I expect after the procedure? After the procedure, it is common to have:  Vomiting.  A sore throat.  Mental slowness. It is common to feel:  Nauseous.  Cold or shivery.  Sleepy.  Tired.  Sore or achy, even in parts of your body where you did not have surgery. Follow these instructions at home: For at least 24 hours after the procedure:  Do not:  Participate in activities where you could fall or become injured.  Drive.  Use heavy machinery.  Drink alcohol.  Take sleeping pills or medicines that cause drowsiness.  Make important decisions or sign legal documents.  Take care of children on your own.  Rest. Eating and drinking  If you vomit, drink water, juice, or soup when you can drink without vomiting.  Drink enough fluid to keep your urine clear or pale yellow.  Make sure you have little or no nausea before  eating solid foods.  Follow the diet recommended by your health care provider. General instructions  Have a responsible adult stay with you until you are awake and alert.  Return to your normal activities as told by your health care provider. Ask your health care provider what activities are safe for you.  Take over-the-counter and prescription medicines only as told by your health care provider.  If you smoke, do not smoke without supervision.  Keep all follow-up visits as told by your health care provider. This is important. Contact a health care provider if:  You continue to have nausea or vomiting at home, and medicines are not helpful.  You cannot drink fluids or start eating again.  You cannot urinate after 8-12 hours.  You develop a skin rash.  You have fever.  You have increasing redness at the site of your procedure. Get help right away if:  You have difficulty breathing.  You have chest pain.  You have unexpected bleeding.  You feel that you are having a life-threatening or urgent problem. This information is not intended to replace advice given to you by your health care provider. Make sure you discuss any questions you have with your health care provider. Document Released: 10/28/2000 Document Revised: 12/25/2015 Document Reviewed: 07/06/2015 Elsevier Interactive Patient Education  2017 [employer].

## 2016-09-27 ENCOUNTER — Other Ambulatory Visit: Payer: Self-pay | Admitting: Obstetrics and Gynecology

## 2016-09-27 ENCOUNTER — Encounter (HOSPITAL_COMMUNITY)
Admission: RE | Admit: 2016-09-27 | Discharge: 2016-09-27 | Disposition: A | Discharge status: Home / Self Care | Payer: Medicaid Other | Source: Ambulatory Visit | Attending: Obstetrics and Gynecology | Admitting: Obstetrics and Gynecology

## 2016-09-27 ENCOUNTER — Encounter (HOSPITAL_COMMUNITY): Payer: Self-pay

## 2016-09-27 DIAGNOSIS — F329 Major depressive disorder, single episode, unspecified: Secondary | ICD-10-CM | POA: Diagnosis not present

## 2016-09-27 DIAGNOSIS — Z01812 Encounter for preprocedural laboratory examination: Secondary | ICD-10-CM | POA: Diagnosis present

## 2016-09-27 DIAGNOSIS — J45909 Unspecified asthma, uncomplicated: Secondary | ICD-10-CM | POA: Diagnosis not present

## 2016-09-27 DIAGNOSIS — D259 Leiomyoma of uterus, unspecified: Secondary | ICD-10-CM | POA: Diagnosis not present

## 2016-09-27 DIAGNOSIS — N946 Dysmenorrhea, unspecified: Secondary | ICD-10-CM | POA: Diagnosis not present

## 2016-09-27 DIAGNOSIS — N920 Excessive and frequent menstruation with regular cycle: Secondary | ICD-10-CM | POA: Insufficient documentation

## 2016-09-27 DIAGNOSIS — N943 Premenstrual tension syndrome: Secondary | ICD-10-CM | POA: Diagnosis not present

## 2016-09-27 DIAGNOSIS — F419 Anxiety disorder, unspecified: Secondary | ICD-10-CM | POA: Diagnosis not present

## 2016-09-27 DIAGNOSIS — K589 Irritable bowel syndrome without diarrhea: Secondary | ICD-10-CM | POA: Insufficient documentation

## 2016-09-27 DIAGNOSIS — N83209 Unspecified ovarian cyst, unspecified side: Secondary | ICD-10-CM | POA: Insufficient documentation

## 2016-09-27 HISTORY — DX: Other specified postprocedural states: Z98.890

## 2016-09-27 HISTORY — DX: Nausea with vomiting, unspecified: R11.2

## 2016-09-27 LAB — CBC
HEMATOCRIT: 38.3 % (ref 36.0–46.0)
HEMOGLOBIN: 12.5 g/dL (ref 12.0–15.0)
MCH: 29.9 pg (ref 26.0–34.0)
MCHC: 32.6 g/dL (ref 30.0–36.0)
MCV: 91.6 fL (ref 78.0–100.0)
PLATELETS: 293 10*3/uL (ref 150–400)
RBC: 4.18 MIL/uL (ref 3.87–5.11)
RDW: 14.3 % (ref 11.5–15.5)
WBC: 6.5 10*3/uL (ref 4.0–10.5)

## 2016-09-27 LAB — COMPREHENSIVE METABOLIC PANEL
ALK PHOS: 49 U/L (ref 38–126)
ALT: 12 U/L — AB (ref 14–54)
AST: 19 U/L (ref 15–41)
Albumin: 3.8 g/dL (ref 3.5–5.0)
Anion gap: 8 (ref 5–15)
BILIRUBIN TOTAL: 0.3 mg/dL (ref 0.3–1.2)
BUN: 11 mg/dL (ref 6–20)
CALCIUM: 8.7 mg/dL — AB (ref 8.9–10.3)
CO2: 23 mmol/L (ref 22–32)
CREATININE: 0.79 mg/dL (ref 0.44–1.00)
Chloride: 105 mmol/L (ref 101–111)
GFR calc Af Amer: >60 mL/min (ref >60)
Glucose, Bld: 94 mg/dL (ref 65–99)
Potassium: 3.5 mmol/L (ref 3.5–5.1)
Sodium: 136 mmol/L (ref 135–145)
Total Protein: 6.9 g/dL (ref 6.5–8.1)

## 2016-09-27 LAB — HCG, SERUM, QUALITATIVE: PREG SERUM: NEGATIVE

## 2016-09-30 ENCOUNTER — Telehealth: Payer: Self-pay | Admitting: Obstetrics and Gynecology

## 2016-09-30 NOTE — Telephone Encounter (Signed)
Pt's mother will get in touch with the patient and have her call us.

## 2016-09-30 NOTE — Telephone Encounter (Signed)
Pt called , left message for her to consider rescheduling her case til Thursday morning.

## 2016-10-01 ENCOUNTER — Ambulatory Visit (HOSPITAL_COMMUNITY): Payer: Medicaid Other | Admitting: Anesthesiology

## 2016-10-01 ENCOUNTER — Ambulatory Visit (HOSPITAL_COMMUNITY)
Admission: RE | Admit: 2016-10-01 | Discharge: 2016-10-01 | Disposition: A | Discharge status: Home / Self Care | Payer: Medicaid Other | Source: Ambulatory Visit | Attending: Obstetrics and Gynecology | Admitting: Obstetrics and Gynecology

## 2016-10-01 ENCOUNTER — Encounter: Payer: Self-pay | Admitting: Internal Medicine

## 2016-10-01 ENCOUNTER — Encounter (HOSPITAL_COMMUNITY): Payer: Self-pay | Admitting: *Deleted

## 2016-10-01 ENCOUNTER — Encounter (HOSPITAL_COMMUNITY)
Admission: RE | Disposition: A | Discharge status: Home / Self Care | Payer: Self-pay | Source: Ambulatory Visit | Attending: Obstetrics and Gynecology

## 2016-10-01 DIAGNOSIS — N920 Excessive and frequent menstruation with regular cycle: Secondary | ICD-10-CM | POA: Diagnosis present

## 2016-10-01 DIAGNOSIS — D250 Submucous leiomyoma of uterus: Secondary | ICD-10-CM | POA: Insufficient documentation

## 2016-10-01 DIAGNOSIS — K219 Gastro-esophageal reflux disease without esophagitis: Secondary | ICD-10-CM | POA: Insufficient documentation

## 2016-10-01 DIAGNOSIS — N946 Dysmenorrhea, unspecified: Secondary | ICD-10-CM | POA: Diagnosis present

## 2016-10-01 DIAGNOSIS — L723 Sebaceous cyst: Secondary | ICD-10-CM | POA: Insufficient documentation

## 2016-10-01 HISTORY — PX: DILITATION & CURRETTAGE/HYSTROSCOPY WITH NOVASURE ABLATION: SHX5568

## 2016-10-01 SURGERY — DILATATION & CURETTAGE/HYSTEROSCOPY WITH NOVASURE ABLATION
Anesthesia: General

## 2016-10-01 MED ORDER — LIDOCAINE HCL 1 % IJ SOLN
INTRAMUSCULAR | Status: DC | PRN
  Administered 2016-10-01: 25 mg via INTRADERMAL

## 2016-10-01 MED ORDER — MIDAZOLAM HCL 5 MG/5ML IJ SOLN
INTRAMUSCULAR | Status: DC | PRN
  Administered 2016-10-01: 2 mg via INTRAVENOUS

## 2016-10-01 MED ORDER — MIDAZOLAM HCL 2 MG/2ML IJ SOLN
1.0000 mg | INTRAMUSCULAR | Status: AC
  Administered 2016-10-01 (×2): 2 mg via INTRAVENOUS
  Filled 2016-10-01: qty 2

## 2016-10-01 MED ORDER — FENTANYL CITRATE (PF) 100 MCG/2ML IJ SOLN
INTRAMUSCULAR | Status: DC | PRN
  Administered 2016-10-01 (×2): 50 ug via INTRAVENOUS

## 2016-10-01 MED ORDER — ONDANSETRON HCL 4 MG/2ML IJ SOLN
INTRAMUSCULAR | Status: AC
  Filled 2016-10-01: qty 2

## 2016-10-01 MED ORDER — PROPOFOL 10 MG/ML IV BOLUS
INTRAVENOUS | Status: AC
  Filled 2016-10-01: qty 20

## 2016-10-01 MED ORDER — SODIUM CHLORIDE 0.9 % IR SOLN
Status: DC | PRN
  Administered 2016-10-01: 3000 mL

## 2016-10-01 MED ORDER — BUPIVACAINE HCL (PF) 0.5 % IJ SOLN
INTRAMUSCULAR | Status: AC
  Filled 2016-10-01: qty 30

## 2016-10-01 MED ORDER — MIDAZOLAM HCL 2 MG/2ML IJ SOLN
INTRAMUSCULAR | Status: AC
  Filled 2016-10-01: qty 2

## 2016-10-01 MED ORDER — BUPIVACAINE-EPINEPHRINE (PF) 0.5% -1:200000 IJ SOLN
INTRAMUSCULAR | Status: DC | PRN
  Administered 2016-10-01: 16 mL via PERINEURAL

## 2016-10-01 MED ORDER — SCOPOLAMINE 1 MG/3DAYS TD PT72
MEDICATED_PATCH | TRANSDERMAL | Status: AC
  Filled 2016-10-01: qty 1

## 2016-10-01 MED ORDER — LACTATED RINGERS IV SOLN
INTRAVENOUS | Status: DC
  Administered 2016-10-01: 14:00:00 via INTRAVENOUS

## 2016-10-01 MED ORDER — DEXAMETHASONE SODIUM PHOSPHATE 4 MG/ML IJ SOLN
INTRAMUSCULAR | Status: AC
  Filled 2016-10-01: qty 1

## 2016-10-01 MED ORDER — HYDROMORPHONE HCL 1 MG/ML IJ SOLN: 0.2500 mg | INTRAMUSCULAR | Status: DC | PRN

## 2016-10-01 MED ORDER — BUPIVACAINE-EPINEPHRINE (PF) 0.5% -1:200000 IJ SOLN
INTRAMUSCULAR | Status: AC
  Filled 2016-10-01: qty 30

## 2016-10-01 MED ORDER — 0.9 % SODIUM CHLORIDE (POUR BTL) OPTIME
TOPICAL | Status: DC | PRN
  Administered 2016-10-01: 1000 mL

## 2016-10-01 MED ORDER — ONDANSETRON HCL 4 MG/2ML IJ SOLN
4.0000 mg | Freq: Once | INTRAMUSCULAR | Status: AC
  Administered 2016-10-01: 4 mg via INTRAVENOUS

## 2016-10-01 MED ORDER — ONDANSETRON HCL 4 MG/2ML IJ SOLN
INTRAMUSCULAR | Status: DC | PRN
  Administered 2016-10-01: 4 mg via INTRAVENOUS

## 2016-10-01 MED ORDER — TRAMADOL HCL 50 MG PO TABS: 50.0000 mg | ORAL_TABLET | Freq: Four times a day (QID) | ORAL | 0 refills | Status: DC | PRN

## 2016-10-01 MED ORDER — FENTANYL CITRATE (PF) 100 MCG/2ML IJ SOLN
INTRAMUSCULAR | Status: AC
Start: 2016-10-01 — End: ?
  Filled 2016-10-01: qty 4

## 2016-10-01 MED ORDER — KETOROLAC TROMETHAMINE 10 MG PO TABS: 10.0000 mg | ORAL_TABLET | Freq: Four times a day (QID) | ORAL | 0 refills | Status: DC | PRN

## 2016-10-01 MED ORDER — PROPOFOL 10 MG/ML IV BOLUS
INTRAVENOUS | Status: DC | PRN
  Administered 2016-10-01: 140 mg via INTRAVENOUS

## 2016-10-01 MED ORDER — DEXAMETHASONE SODIUM PHOSPHATE 4 MG/ML IJ SOLN
4.0000 mg | INTRAMUSCULAR | Status: AC
  Administered 2016-10-01: 4 mg via INTRAVENOUS

## 2016-10-01 MED ORDER — SCOPOLAMINE 1 MG/3DAYS TD PT72
1.0000 | MEDICATED_PATCH | Freq: Once | TRANSDERMAL | Status: DC
  Administered 2016-10-01: 1.5 mg via TRANSDERMAL

## 2016-10-01 SURGICAL SUPPLY — 28 items
ABLATOR ENDOMETRIAL BIPOLAR (ABLATOR) ×2 IMPLANT
BAG HAMPER (MISCELLANEOUS) ×2 IMPLANT
CATH ROBINSON RED A/P 16FR (CATHETERS) ×2 IMPLANT
CLOTH BEACON ORANGE TIMEOUT ST (SAFETY) ×2 IMPLANT
COVER LIGHT HANDLE STERIS (MISCELLANEOUS) ×4 IMPLANT
DECANTER SPIKE VIAL GLASS SM (MISCELLANEOUS) ×2 IMPLANT
FORMALIN 10 PREFIL 120ML (MISCELLANEOUS) ×2 IMPLANT
GLOVE BIOGEL PI IND STRL 7.0 (GLOVE) ×1 IMPLANT
GLOVE BIOGEL PI IND STRL 9 (GLOVE) ×1 IMPLANT
GLOVE BIOGEL PI INDICATOR 7.0 (GLOVE) ×1
GLOVE BIOGEL PI INDICATOR 9 (GLOVE) ×1
GLOVE ECLIPSE 9.0 STRL (GLOVE) ×2 IMPLANT
GOWN SPEC L3 XXLG W/TWL (GOWN DISPOSABLE) ×2 IMPLANT
GOWN STRL REUS W/TWL LRG LVL3 (GOWN DISPOSABLE) ×2 IMPLANT
INST SET HYSTEROSCOPY (KITS) ×2 IMPLANT
IV NS IRRIG 3000ML ARTHROMATIC (IV SOLUTION) ×2 IMPLANT
KIT ROOM TURNOVER AP CYSTO (KITS) ×2 IMPLANT
KIT ROOM TURNOVER APOR (KITS) ×2 IMPLANT
MANIFOLD NEPTUNE II (INSTRUMENTS) ×2 IMPLANT
NS IRRIG 1000ML POUR BTL (IV SOLUTION) ×2 IMPLANT
PACK PERI GYN (CUSTOM PROCEDURE TRAY) ×2 IMPLANT
PAD ARMBOARD 7.5X6 YLW CONV (MISCELLANEOUS) ×2 IMPLANT
PAD TELFA 3X4 1S STER (GAUZE/BANDAGES/DRESSINGS) ×2 IMPLANT
SET BASIN LINEN APH (SET/KITS/TRAYS/PACK) ×2 IMPLANT
SET IRRIG Y TYPE TUR BLADDER L (SET/KITS/TRAYS/PACK) ×2 IMPLANT
SUT VIC AB 4-0 SH 27 (SUTURE) ×1
SUT VIC AB 4-0 SH 27XBRD (SUTURE) ×1 IMPLANT
SYR CONTROL 10ML LL (SYRINGE) ×2 IMPLANT

## 2016-10-01 NOTE — Discharge Instructions (Signed)
Endometrial Ablation Endometrial ablation is a procedure that destroys the thin inner layer of the lining of the uterus (endometrium). This procedure may be done:  To stop heavy periods.  To stop bleeding that is causing anemia.  To control irregular bleeding.  To treat bleeding caused by small tumors (fibroids) in the endometrium.  This procedure is often an alternative to major surgery, such as removal of the uterus and cervix (hysterectomy). As a result of this procedure:  You may not be able to have children. However, if you are premenopausal (you have not gone through menopause): ? You may still have a small chance of getting pregnant. ? You will need to use a reliable method of birth control after the procedure to prevent pregnancy.  You may stop having a menstrual period, or you may have only a small amount of bleeding during your period. Menstruation may return several years after the procedure.  Tell a health care provider about:  Any allergies you have.  All medicines you are taking, including vitamins, herbs, eye drops, creams, and over-the-counter medicines.  Any problems you or family members have had with the use of anesthetic medicines.  Any blood disorders you have.  Any surgeries you have had.  Any medical conditions you have. What are the risks? Generally, this is a safe procedure. However, problems may occur, including:  A hole (perforation) in the uterus or bowel.  Infection of the uterus, bladder, or vagina.  Bleeding.  Damage to other structures or organs.  An air bubble in the lung (air embolus).  Problems with pregnancy after the procedure.  Failure of the procedure.  Decreased ability to diagnose cancer in the endometrium.  What happens before the procedure?  You will have tests of your endometrium to make sure there are no pre-cancerous cells or cancer cells present.  You may have an ultrasound of the uterus.  You may be given  medicines to thin the endometrium.  Ask your health care provider about: ? Changing or stopping your regular medicines. This is especially important if you take diabetes medicines or blood thinners. ? Taking medicines such as aspirin and ibuprofen. These medicines can thin your blood. Do not take these medicines before your procedure if your doctor tells you not to.  Plan to have someone take you home from the hospital or clinic. What happens during the procedure?  You will lie on an exam table with your feet and legs supported as in a pelvic exam.  To lower your risk of infection: ? Your health care team will wash or sanitize their hands and put on germ-free (sterile) gloves. ? Your genital area will be washed with soap.  An IV tube will be inserted into one of your veins.  You will be given a medicine to help you relax (sedative).  A surgical instrument with a light and camera (resectoscope) will be inserted into your vagina and moved into your uterus. This allows your surgeon to see inside your uterus.  Endometrial tissue will be removed using one of the following methods: ? Radiofrequency. This method uses a radiofrequency-alternating electric current to remove the endometrium. ? Cryotherapy. This method uses extreme cold to freeze the endometrium. ? Heated-free liquid. This method uses a heated saltwater (saline) solution to remove the endometrium. ? Microwave. This method uses high-energy microwaves to heat up the endometrium and remove it. ? Thermal balloon. This method involves inserting a catheter with a balloon tip into the uterus. The balloon tip is   filled with heated fluid to remove the endometrium. The procedure may vary among health care providers and hospitals. What happens after the procedure?  Your blood pressure, heart rate, breathing rate, and blood oxygen level will be monitored until the medicines you were given have worn off.  As tissue healing occurs, you may  notice vaginal bleeding for 4-6 weeks after the procedure. You may also experience: ? Cramps. ? Thin, watery vaginal discharge that is light pink or brown in color. ? A need to urinate more frequently than usual. ? Nausea.  Do not drive for 24 hours if you were given a sedative.  Do not have sex or insert anything into your vagina until your health care provider approves. Summary  Endometrial ablation is done to treat the many causes of heavy menstrual bleeding.  The procedure may be done only after medications have been tried to control the bleeding.  Plan to have someone take you home from the hospital or clinic. This information is not intended to replace advice given to you by your health care provider. Make sure you discuss any questions you have with your health care provider. Document Released: 05/31/2004 Document Revised: 08/08/2016 Document Reviewed: 08/08/2016 Elsevier Interactive Patient Education  2017 [employer]  

## 2016-10-01 NOTE — Anesthesia Procedure Notes (Signed)
Procedure Name: LMA Insertion Date/Time: 10/01/2016 2:28 PM Performed by: Charmaine Downs Pre-anesthesia Checklist: Patient identified, Patient being monitored, Emergency Drugs available, Timeout performed and Suction available Patient Re-evaluated:Patient Re-evaluated prior to inductionOxygen Delivery Method: Circle System Utilized Preoxygenation: Pre-oxygenation with 100% oxygen Intubation Type: IV induction Ventilation: Mask ventilation without difficulty LMA: LMA inserted LMA Size: 4.0 Number of attempts: 1 Placement Confirmation: positive ETCO2 and breath sounds checked- equal and bilateral Tube secured with: Tape Dental Injury: Teeth and Oropharynx as per pre-operative assessment

## 2016-10-01 NOTE — Brief Op Note (Signed)
10/01/2016  3:22 PM  PATIENT:  Brooke Eaton  41 y.o. female  PRE-OPERATIVE DIAGNOSIS:  menorrhagia submucosal fibroid, dysmenorrhea 2. Gluteal follicle sebaceous cyst  POST-OPERATIVE DIAGNOSIS:  menorrhagia submucosal fibroid, dysmenorrhea, same  PROCEDURE:  Procedure(s): DILATATION & CURETTAGE/HYSTEROSCOPY WITH NOVASURE ABLATION AND EXCISION OF VULVAR/GLUTEAL  CYST (N/A)  SURGEON:  Surgeon(s) and Role:    * Jonnie Kind, MD - Primary  PHYSICIAN ASSISTANT:   ASSISTANTS: none   ANESTHESIA:   local and general  EBL:  Total I/O In: 500 [I.V.:500] Out: 5 [Blood:5]  BLOOD ADMINISTERED:none  DRAINS: none   LOCAL MEDICATIONS USED:  MARCAINE    and Amount: 30 ml  SPECIMEN:  No Specimen  DISPOSITION OF SPECIMEN:  N/A  COUNTS:  YES  TOURNIQUET:  * No tourniquets in log *  DICTATION: .Dragon Dictation  PLAN OF CARE: Discharge to home after PACU  PATIENT DISPOSITION:  PACU - hemodynamically stable.   Delay start of Pharmacological VTE agent (>24hrs) due to surgical blood loss or risk of bleeding: not applicable Details of procedure. Patient was taken operating room prepped and draped for vaginal procedure with Betadine prepping performed. The cervix was grasped with single-tooth tenaculum through single-sided speculum some difficulty we can find the anteflexed uterus. We did not slide the patient on the table to get proper orientation of the speculum and the uterine axis we applied paracervical block, then were able to dilate the uterus to 20 French 9 cm depth, and then hysteroscopy confirmed a thin endometrial cavity without any significant tissue. Curettage obtained note significant tissue. The endometrial ablation device was prepared and inserted measured to 6 cm cavity length and 3.5 cm cavity length, activated, the 54 second endometrial ablation sequence completed and the device removed.  Vulvar sebaceous cyst was removed from the right buttock with Marcaine injected  around the meters sebaceous cysts which was trimmed out through an elliptical incision and 3 stitches placed beneath the skin to reapproximate edges. Sponge and needle counts were correct Blood loss minimal recovery room good

## 2016-10-01 NOTE — Anesthesia Preprocedure Evaluation (Signed)
Anesthesia Evaluation  Patient identified by MRN, date of birth, ID band Patient awake    Reviewed: Allergy & Precautions, NPO status , Patient's Chart, lab work & pertinent test results  History of Anesthesia Complications (+) PONV and history of anesthetic complications  Airway Mallampati: II  TM Distance: >3 FB     Dental  (+) Teeth Intact   Pulmonary asthma ,    breath sounds clear to auscultation       Cardiovascular negative cardio ROS   Rhythm:Regular Rate:Normal     Neuro/Psych  Headaches, PSYCHIATRIC DISORDERS (PTSD) Anxiety Depression    GI/Hepatic GERD  Controlled and Medicated,  Endo/Other    Renal/GU      Musculoskeletal   Abdominal   Peds  Hematology   Anesthesia Other Findings   Reproductive/Obstetrics                             Anesthesia Physical Anesthesia Plan  ASA: II  Anesthesia Plan: General   Post-op Pain Management:    Induction: Intravenous  Airway Management Planned: LMA  Additional Equipment:   Intra-op Plan:   Post-operative Plan: Extubation in OR  Informed Consent: I have reviewed the patients History and Physical, chart, labs and discussed the procedure including the risks, benefits and alternatives for the proposed anesthesia with the patient or authorized representative who has indicated his/her understanding and acceptance.     Plan Discussed with:   Anesthesia Plan Comments:         Anesthesia Quick Evaluation

## 2016-10-01 NOTE — Telephone Encounter (Signed)
Pt contacted to see if case could be switched to 3/1. Pt declines the option.

## 2016-10-01 NOTE — Op Note (Signed)
Please see the brief operative note for surgical details 

## 2016-10-01 NOTE — Addendum Note (Signed)
Addendum  created 10/01/16 1630 by Ollen Bowl, CRNA   Anesthesia Intra Meds edited, Sign clinical note

## 2016-10-01 NOTE — H&P (Signed)
Patient ID: Brooke Eaton, female   DOB: July 07, 1976, 41 y.o.   MRN: [id number]  Preoperative History and Physical  Brooke Eaton is a 41 y.o. G2P0020 here for surgical management of menorrhagia, submucosal fibroid <3cm. She notes an intermittent area of swelling that occurs before and after periods.    Proposed surgery: endometrial ablation       Past Medical History:  Diagnosis Date  . Acid reflux   . Allergy   . Anxiety   . Anxiety and depression   . Asthma   . Depression   . Fibroid   . Heart murmur   . Irritable bowel syndrome   . Migraines   . Post-traumatic stress disorder   . PTSD (post-traumatic stress disorder)   . Ruptured cyst of ovary 2001  . Substance abuse         Past Surgical History:  Procedure Laterality Date  . ADENOIDECTOMY    . DILATION AND CURETTAGE OF UTERUS    . TONSILLECTOMY    . TUBAL LIGATION  2016  . WISDOM TOOTH EXTRACTION                     OB History  Gravida Para Term Preterm AB Living  2 0     2 0  SAB TAB Ectopic Multiple Live Births  2            # Outcome Date GA Lbr Len/2nd Weight Sex Delivery Anes PTL Lv  2 SAB           1 SAB             Obstetric Comments  PT HAD ONE STILL BIRTH  Patient denies any other pertinent gynecologic issues.         Current Outpatient Prescriptions on File Prior to Visit  Medication Sig Dispense Refill  . albuterol (PROVENTIL HFA;VENTOLIN HFA) 108 (90 BASE) MCG/ACT inhaler Inhale 1-2 puffs into the lungs every 6 (six) hours as needed for wheezing. 1 Inhaler 0  . medroxyPROGESTERone (PROVERA) 5 MG tablet Take 1 tablet (5 mg total) by mouth daily. One tablet daily x 2 weeks. Repeat as directed 30 tablet 1   No current facility-administered medications on file prior to visit.         Allergies  Allergen Reactions  . Doxycycline Nausea And Vomiting    Cannot tolerate oral doxycycline   . Flagyl [Metronidazole] Itching  . Latex Itching  .  Penicillins Itching    Social History:   reports that she has never smoked. She has never used smokeless tobacco. She reports that she drinks alcohol. She reports that she uses drugs, including Marijuana.  Family History  Problem Relation Age of Onset  . Emphysema Maternal Grandmother   . Gout Maternal Grandmother   . Diabetes Maternal Grandmother   . Gout Father   . Alcohol abuse Father   . Arthritis Father   . COPD Father   . Depression Father   . Drug abuse Father   . Hyperlipidemia Father   . Hypertension Father   . Gout Mother   . Arthritis Mother     RA  . Diabetes Mother   . Depression Mother   . Heart disease Mother   . Hyperlipidemia Mother   . Hypertension Mother   . Vision loss Mother   . Thyroid disease Maternal Aunt   . Diabetes Maternal Uncle     Review of Systems: Noncontributory  PHYSICAL EXAM: Blood pressure 122/80, pulse  90, height 5\' 6"  (1.676 m), weight 187 lb 6.4 oz (85 kg), last menstrual period 09/02/2016. General appearance - alert, well appearing, and in no distress Chest - clear to auscultation, no wheezes, rales or rhonchi, symmetric air entry Heart - normal rate and regular rhythm Abdomen - soft, nontender, nondistended, no masses or organomegaly Pelvic -  VULVA: normal appearing vulva with no masses, tenderness or lesions, on left buttock cheek at 5 o'clock 1 cm just inside thigh crease, there is a firm knot  VAGINA: normal appearing vagina with normal color and discharge, no lesions,  CERVIX: normal appearing cervix without discharge or lesions,  UTERUS: uterus is normal size, shape, consistency and nontender, retroverted uterus, upper limits of normal size  ADNEXA: normal adnexa in size, nontender and no masses.  Extremities - peripheral pulses normal, no pedal edema, no clubbing or cyanosis  GC/CHL collected   Labs:      Results for orders placed or performed in visit on 09/02/16 (from the past 336  hour(s))  COMPLETE METABOLIC PANEL WITH GFR   Collection Time: 09/02/16  4:24 PM  Result Value Ref Range   Sodium 141 135 - 146 mmol/L   Potassium 3.9 3.5 - 5.3 mmol/L   Chloride 108 98 - 110 mmol/L   CO2 28 20 - 31 mmol/L   Glucose, Bld 62 (L) 65 - 99 mg/dL   BUN 12 7 - 25 mg/dL   Creat 0.87 0.50 - 1.10 mg/dL   Total Bilirubin 0.3 0.2 - 1.2 mg/dL   Alkaline Phosphatase 49 33 - 115 U/L   AST 12 10 - 30 U/L   ALT 9 6 - 29 U/L   Total Protein 6.7 6.1 - 8.1 g/dL   Albumin 3.9 3.6 - 5.1 g/dL   Calcium 8.8 8.6 - 10.2 mg/dL   GFR, Est African American >89 >=60 mL/min   GFR, Est Non African American 84 >=60 mL/min  Hemoglobin A1c   Collection Time: 09/02/16  4:24 PM  Result Value Ref Range   Hgb A1c MFr Bld 5.4 <5.7 %   Mean Plasma Glucose 108 mg/dL  TSH   Collection Time: 09/02/16  4:24 PM  Result Value Ref Range   TSH 1.02 mIU/L  Lipid panel   Collection Time: 09/02/16  4:24 PM  Result Value Ref Range   Cholesterol 154 <200 mg/dL   Triglycerides 49 <150 mg/dL   HDL 42 (L) >50 mg/dL   Total CHOL/HDL Ratio 3.7 <5.0 Ratio   VLDL 10 <30 mg/dL   LDL Cholesterol 102 (H) <100 mg/dL  CBC   Collection Time: 09/02/16  4:24 PM  Result Value Ref Range   WBC 4.8 3.8 - 10.8 K/uL   RBC 4.06 3.80 - 5.10 MIL/uL   Hemoglobin 11.7 11.7 - 15.5 g/dL   HCT 36.8 35.0 - 45.0 %   MCV 90.6 80.0 - 100.0 fL   MCH 28.8 27.0 - 33.0 pg   MCHC 31.8 (L) 32.0 - 36.0 g/dL   RDW 13.9 11.0 - 15.0 %   Platelets 286 140 - 400 K/uL   MPV 9.3 7.5 - 12.5 fL    Imaging Studies:  ImagingResults  US Transvaginal Non-ob  Result Date: 09/04/2016 GYNECOLOGIC SONOGRAM Niala Cabbagestalk is a 41 y.o. G2P0020 LMP 08/05/2016 for a pelvic sonogram for abnormal uterine bleeding. Uterus                      7.6 x 5.6 x 4.2 cm, retroverted heterogenous uterus,posterior  submucosal fibroid distorting the endometrium 1.8 x 1 x 1.8 cm,the fundus is heterogenous,but no discrete mass seen.  Endometrium          8.1 mm, symmetrical, distorted by posterior submucosal fibroid Right ovary             2.3 x 2.5 x 1.7 cm,  wnl Left ovary                2.9 x 2.6 x 2.3 cm, wnl Small amount of simple cul de sac fluid Technician Comments: PELVIC US TA/TV: retroverted heterogenous uterus,posterior submucosal fibroid distorting the endometrium 1.8 x 1 x 1.8 cm,the fundus is heterogenous,but no discrete mass seen,normal ov's bilat,EEC 8.1 mm,small amount of simple cul de sac fluid ,left adnexal pain during ultrasound,ovaries appear mobile [employer] 08/23/2016 10:55 AM Clinical Impression and recommendations: I have reviewed the sonogram results above, combined with the patient's current clinical course, below are my impressions and any appropriate recommendations for management based on the sonographic findings. Uterus overall normal volume with endometrium being altered by small myoma Endometrial stripe is normal with distortion by small myoma Both ovaries are normal Unclear if the submucosal myoma is solely, partially or bears any responsibility for the patient's bleeding complaints, however reasonable to assume is partially resposible EURE,LUTHER H   US Pelvis Complete  Result Date: 09/04/2016 GYNECOLOGIC SONOGRAM Evelena Huguley is a 41 y.o. G2P0020 LMP 08/05/2016 for a pelvic sonogram for abnormal uterine bleeding. Uterus                      7.6 x 5.6 x 4.2 cm, retroverted heterogenous uterus,posterior submucosal fibroid distorting the endometrium 1.8 x 1 x 1.8 cm,the fundus is heterogenous,but no discrete mass seen. Endometrium          8.1 mm, symmetrical, distorted by posterior submucosal fibroid Right ovary             2.3 x 2.5 x 1.7 cm,  wnl Left ovary                2.9 x 2.6 x 2.3 cm, wnl Small amount of simple cul de sac fluid Technician Comments: PELVIC US TA/TV: retroverted heterogenous uterus,posterior submucosal fibroid distorting the endometrium 1.8 x 1 x 1.8 cm,the fundus is  heterogenous,but no discrete mass seen,normal ov's bilat,EEC 8.1 mm,small amount of simple cul de sac fluid ,left adnexal pain during ultrasound,ovaries appear mobile [employer] 08/23/2016 10:55 AM Clinical Impression and recommendations: I have reviewed the sonogram results above, combined with the patient's current clinical course, below are my impressions and any appropriate recommendations for management based on the sonographic findings. Uterus overall normal volume with endometrium being altered by small myoma Endometrial stripe is normal with distortion by small myoma Both ovaries are normal Unclear if the submucosal myoma is solely, partially or bears any responsibility for the patient's bleeding complaints, however reasonable to assume is partially resposible EURE,LUTHER H     Assessment:     Patient Active Problem List   Diagnosis Date Noted  . Asthma 09/02/2016  . Marijuana use, continuous 09/02/2016  . Menorrhagia with regular cycle 08/16/2016  . Dysmenorrhea 01/17/2012  . PMS (premenstrual syndrome) 01/17/2012  . Anxiety and depression   . Post-traumatic stress disorder   . Ruptured cyst of ovary   . Fibroid   . Irritable bowel syndrome   . Acid reflux     Plan: Patient will undergo surgical management with endometrial ablation, excision of  vulvar/gluteal abscess/furuncle.      .mec 09/16/2016 3:15 PM   By signing my name below, I, Hansel Feinstein, attest that this documentation has been prepared under the direction and in the presence of Jonnie Kind, MD. Electronically Signed: Hansel Feinstein, ED Scribe. 09/16/16. 3:16 PM.

## 2016-10-01 NOTE — Anesthesia Postprocedure Evaluation (Addendum)
Anesthesia Post Note  Patient: Brooke Eaton  Procedure(s) Performed: Procedure(s) (LRB): DILATATION & CURETTAGE/HYSTEROSCOPY WITH NOVASURE ABLATION AND EXCISION OF VULVAR/GLUTEAL  CYST (N/A)  Patient location during evaluation: PACU Anesthesia Type: General Level of consciousness: awake and alert Pain management: pain level controlled Vital Signs Assessment: post-procedure vital signs reviewed and stable Respiratory status: spontaneous breathing Cardiovascular status: blood pressure returned to baseline Postop Assessment: no signs of nausea or vomiting Anesthetic complications: no Comments: 1630 Patient started having some nausea, repeated Zofran and after about 10 minutes tolerated some ginger ale and saltine crackers.  Moved to short stay in stable condition.     Last Vitals:  Vitals:   10/01/16 1530 10/01/16 1545  BP: 118/80 126/74  Pulse: (!) 109 (!) 103  Resp: 20 (!) 23  Temp: 37 C     Last Pain:  Vitals:   10/01/16 1530  TempSrc:   PainSc: 0-No pain                 Folasade Mooty

## 2016-10-01 NOTE — Transfer of Care (Signed)
Immediate Anesthesia Transfer of Care Note  Patient: Brooke Eaton  Procedure(s) Performed: Procedure(s): DILATATION & CURETTAGE/HYSTEROSCOPY WITH NOVASURE ABLATION AND EXCISION OF VULVAR/GLUTEAL  CYST (N/A)  Patient Location: PACU  Anesthesia Type:General  Level of Consciousness: awake and patient cooperative  Airway & Oxygen Therapy: Patient Spontanous Breathing and Patient connected to face mask oxygen  Post-op Assessment: Report given to RN, Post -op Vital signs reviewed and stable and Patient moving all extremities  Post vital signs: Reviewed and stable  Last Vitals:  Vitals:   10/01/16 1232  BP: 115/79  Pulse: 85  Resp: (!) 22  Temp: 37.3 C    Last Pain:  Vitals:   10/01/16 1232  TempSrc: Oral         Complications: No apparent anesthesia complications

## 2016-10-02 ENCOUNTER — Encounter (HOSPITAL_COMMUNITY): Payer: Self-pay | Admitting: Obstetrics and Gynecology

## 2016-10-10 ENCOUNTER — Encounter (HOSPITAL_COMMUNITY): Payer: Self-pay | Admitting: Obstetrics and Gynecology

## 2016-10-14 ENCOUNTER — Encounter: Payer: Self-pay | Admitting: Obstetrics and Gynecology

## 2016-10-14 ENCOUNTER — Ambulatory Visit (INDEPENDENT_AMBULATORY_CARE_PROVIDER_SITE_OTHER): Payer: Medicaid Other | Admitting: Obstetrics and Gynecology

## 2016-10-14 ENCOUNTER — Encounter (INDEPENDENT_AMBULATORY_CARE_PROVIDER_SITE_OTHER): Payer: Self-pay

## 2016-10-14 VITALS — BP 118/72 | HR 82 | Wt 185.0 lb

## 2016-10-14 DIAGNOSIS — Z09 Encounter for follow-up examination after completed treatment for conditions other than malignant neoplasm: Secondary | ICD-10-CM

## 2016-10-14 DIAGNOSIS — B9689 Other specified bacterial agents as the cause of diseases classified elsewhere: Secondary | ICD-10-CM | POA: Diagnosis not present

## 2016-10-14 DIAGNOSIS — N760 Acute vaginitis: Secondary | ICD-10-CM

## 2016-10-14 DIAGNOSIS — N898 Other specified noninflammatory disorders of vagina: Secondary | ICD-10-CM

## 2016-10-14 NOTE — Progress Notes (Signed)
Patient ID: Brooke Eaton, female   DOB: 11-21-1975, 41 y.o.   MRN: [id number]  Subjective:  Brooke Eaton is a 41 y.o. female now 2 weeks s/p D&C/hysteroscopy with novasure ablation and excision of vulvar/gluteal cyst   Chief Complaint  Patient presents with  . Routine Post Op    Pt complains of malodorous vaginal discharge. She denies fever, chills. Pt has not resumed sexual activity.   Review of Systems Negative except malodorous vaginal discharge    Diet:   normal   Bowel movements : normal.  Pain is controlled without any medications.  Objective:  BP 118/72 (BP Location: Right Arm, Patient Position: Sitting, Cuff Size: Normal)   Pulse 82   Wt 185 lb (83.9 kg)   LMP 09/28/2016 Comment: Bleeding now  BMI 29.41 kg/m  General:Well developed, well nourished.  No acute distress. Abdomen: Bowel sounds normal, soft, non-tender. Pelvic Exam:    External Genitalia:  Normal.    Vagina: Normal, clear discharge in the vaginal vault     Cervix: Normal    Uterus: Normal    Adnexa/Bimanual: Normal  Wet prep collected: normal skin cells. No abnormality   Incision(s):   Healing well, no drainage, no erythema, no hernia, no swelling, no dehiscence,  Assessment:  Post-Op 2 weeks s/p D&C/hysteroscopy with novasure ablation and excision of vulvar/gluteal cyst   1. Haemophilus vaginalis  2 normal healing s/p endometrial ablation. Doing otherwise well postoperatively.   Plan:  1.Wound care discussed   2. . current medications: rx Metronidazole x7d  3. Activity restrictions: activity as tolerated, refrain from sexual activity for at least another week  4. return to work: not applicable. 5. Follow up in PRN   By signing my name below, I, Hansel Feinstein, attest that this documentation has been prepared under the direction and in the presence of Jonnie Kind, MD. Electronically Signed: Hansel Feinstein, ED Scribe. 10/14/16. 12:20 PM.  I personally performed the services described in this  documentation, which was SCRIBED in my presence. The recorded information has been reviewed and considered accurate. It has been edited as necessary during review. Jonnie Kind, MD   I personally performed the services described in this documentation, which was SCRIBED in my presence. The recorded information has been reviewed and considered accurate. It has been edited as necessary during review. Jonnie Kind, MD

## 2016-10-22 ENCOUNTER — Ambulatory Visit (INDEPENDENT_AMBULATORY_CARE_PROVIDER_SITE_OTHER): Payer: Medicaid Other | Admitting: Gastroenterology

## 2016-10-22 ENCOUNTER — Other Ambulatory Visit: Payer: Self-pay

## 2016-10-22 ENCOUNTER — Encounter: Payer: Self-pay | Admitting: Gastroenterology

## 2016-10-22 VITALS — BP 127/83 | HR 96 | Temp 97.8°F | Ht 66.5 in | Wt 186.8 lb

## 2016-10-22 DIAGNOSIS — R1310 Dysphagia, unspecified: Secondary | ICD-10-CM

## 2016-10-22 DIAGNOSIS — R197 Diarrhea, unspecified: Secondary | ICD-10-CM

## 2016-10-22 DIAGNOSIS — R1013 Epigastric pain: Secondary | ICD-10-CM

## 2016-10-22 DIAGNOSIS — K219 Gastro-esophageal reflux disease without esophagitis: Secondary | ICD-10-CM | POA: Diagnosis not present

## 2016-10-22 DIAGNOSIS — R1319 Other dysphagia: Secondary | ICD-10-CM | POA: Insufficient documentation

## 2016-10-22 MED ORDER — NA SULFATE-K SULFATE-MG SULF 17.5-3.13-1.6 GM/177ML PO SOLN: 1.0000 | ORAL | 0 refills | Status: DC

## 2016-10-22 NOTE — Patient Instructions (Addendum)
1. Colonoscopy and upper endoscopy with Dr. Oneida Alar. See separate instructions.    Food Choices for Gastroesophageal Reflux Disease, Adult When you have gastroesophageal reflux disease (GERD), the foods you eat and your eating habits are very important. Choosing the right foods can help ease the discomfort of GERD. Consider working with a diet and nutrition specialist (dietitian) to help you make healthy food choices. What general guidelines should I follow? Eating plan   Choose healthy foods low in fat, such as fruits, vegetables, whole grains, low-fat dairy products, and lean meat, fish, and poultry.  Eat frequent, small meals instead of three large meals each day. Eat your meals slowly, in a relaxed setting. Avoid bending over or lying down until 2-3 hours after eating.  Limit high-fat foods such as fatty meats or fried foods.  Limit your intake of oils, butter, and shortening to less than 8 teaspoons each day.  Avoid the following:  Foods that cause symptoms. These may be different for different people. Keep a food diary to keep track of foods that cause symptoms.  Alcohol.  Drinking large amounts of liquid with meals.  Eating meals during the 2-3 hours before bed.  Cook foods using methods other than frying. This may include baking, grilling, or broiling. Lifestyle    Maintain a healthy weight. Ask your health care provider what weight is healthy for you. If you need to lose weight, work with your health care provider to do so safely.  Exercise for at least 30 minutes on 5 or more days each week, or as told by your health care provider.  Avoid wearing clothes that fit tightly around your waist and chest.  Do not use any products that contain nicotine or tobacco, such as cigarettes and e-cigarettes. If you need help quitting, ask your health care provider.  Sleep with the head of your bed raised. Use a wedge under the mattress or blocks under the bed frame to raise the head  of the bed. What foods are not recommended? The items listed may not be a complete list. Talk with your dietitian about what dietary choices are best for you. Grains  Pastries or quick breads with added fat. [place] toast. Vegetables  Deep fried vegetables. [place] fries. Any vegetables prepared with added fat. Any vegetables that cause symptoms. For some people this may include tomatoes and tomato products, chili peppers, onions and garlic, and horseradish. Fruits  Any fruits prepared with added fat. Any fruits that cause symptoms. For some people this may include citrus fruits, such as oranges, grapefruit, pineapple, and lemons. Meats and other protein foods  High-fat meats, such as fatty beef or pork, hot dogs, ribs, ham, sausage, salami and bacon. Fried meat or protein, including fried fish and fried chicken. Nuts and nut butters. Dairy  Whole milk and chocolate milk. Sour cream. Cream. Ice cream. Cream cheese. Milk shakes. Beverages  Coffee and tea, with or without caffeine. Carbonated beverages. Sodas. Energy drinks. Fruit juice made with acidic fruits (such as orange or grapefruit). Tomato juice. Alcoholic drinks. Fats and oils  Butter. Margarine. Shortening. Ghee. Sweets and desserts  Chocolate and cocoa. Donuts. Seasoning and other foods  Pepper. Peppermint and spearmint. Any condiments, herbs, or seasonings that cause symptoms. For some people, this may include curry, hot sauce, or vinegar-based salad dressings. Summary  When you have gastroesophageal reflux disease (GERD), food and lifestyle choices are very important to help ease the discomfort of GERD.  Eat frequent, small meals instead of three large  meals each day. Eat your meals slowly, in a relaxed setting. Avoid bending over or lying down until 2-3 hours after eating.  Limit high-fat foods such as fatty meat or fried foods. This information is not intended to replace advice given to you by your health care provider. Make  sure you discuss any questions you have with your health care provider. Document Released: 07/22/2005 Document Revised: 07/23/2016 Document Reviewed: 07/23/2016 Elsevier Interactive Patient Education  2017 [employer]   Heartburn Heartburn is a type of pain or discomfort that can happen in the throat or chest. It is often described as a burning pain. It may also cause a bad taste in the mouth. Heartburn may feel worse when you lie down or bend over, and it is often worse at night. Heartburn may be caused by stomach contents that move back up into the esophagus (reflux). Follow these instructions at home: Take these actions to decrease your discomfort and to help avoid complications. Diet   Follow a diet as recommended by your health care provider. This may involve avoiding foods and drinks such as:  Coffee and tea (with or without caffeine).  Drinks that contain alcohol.  Energy drinks and sports drinks.  Carbonated drinks or sodas.  Chocolate and cocoa.  Peppermint and mint flavorings.  Garlic and onions.  Horseradish.  Spicy and acidic foods, including peppers, chili powder, curry powder, vinegar, hot sauces, and barbecue sauce.  Citrus fruit juices and citrus fruits, such as oranges, lemons, and limes.  Tomato-based foods, such as red sauce, chili, salsa, and pizza with red sauce.  Fried and fatty foods, such as donuts, french fries, potato chips, and high-fat dressings.  High-fat meats, such as hot dogs and fatty cuts of red and white meats, such as rib eye steak, sausage, ham, and bacon.  High-fat dairy items, such as whole milk, butter, and cream cheese.  Eat small, frequent meals instead of large meals.  Avoid drinking large amounts of liquid with your meals.  Avoid eating meals during the 2-3 hours before bedtime.  Avoid lying down right after you eat.  Do not exercise right after you eat. General instructions   Pay attention to any changes in your  symptoms.  Take over-the-counter and prescription medicines only as told by your health care provider. Do not take aspirin, ibuprofen, or other NSAIDs unless your health care provider told you to do so.  Do not use any tobacco products, including cigarettes, chewing tobacco, and e-cigarettes. If you need help quitting, ask your health care provider.  Wear loose-fitting clothing. Do not wear anything tight around your waist that causes pressure on your abdomen.  Raise (elevate) the head of your bed about 6 inches (15 cm).  Try to reduce your stress, such as with yoga or meditation. If you need help reducing stress, ask your health care provider.  If you are overweight, reduce your weight to an amount that is healthy for you. Ask your health care provider for guidance about a safe weight loss goal.  Keep all follow-up visits as told by your health care provider. This is important. Contact a health care provider if:  You have new symptoms.  You have unexplained weight loss.  You have difficulty swallowing, or it hurts to swallow.  You have wheezing or a persistent cough.  Your symptoms do not improve with treatment.  You have frequent heartburn for more than two weeks. Get help right away if:  You have pain in your arms, neck,  jaw, teeth, or back.  You feel sweaty, dizzy, or light-headed.  You have chest pain or shortness of breath.  You vomit and your vomit looks like blood or coffee grounds.  Your stool is bloody or black. This information is not intended to replace advice given to you by your health care provider. Make sure you discuss any questions you have with your health care provider. Document Released: 12/08/2008 Document Revised: 12/28/2015 Document Reviewed: 11/16/2014 Elsevier Interactive Patient Education  2017 [employer].

## 2016-10-22 NOTE — Progress Notes (Addendum)
REVIEWED-NO ADDITIONAL RECOMMENDATIONS.  Primary Care Physician: Raylene Everts, MD  Primary Gastroenterologist: Barney Drain, MD    Chief Complaint  Patient presents with  . Gastroesophageal Reflux  . Irritable Bowel Syndrome    HPI: Brooke Eaton is a 41 y.o. female here at the request of PCP for further evaluation of gerd and bowel concerns.   Patient reports history of acid reflux dating back to 2001. States she has lost teeth because of frequent vomiting. She admits to having a very "nervous" stomach. If she gets upset or anxious she can have vomiting and diarrhea uncontrollably. She reports remote EGD by Dr. Posey Pronto. She cannot recall findings. Over 12 years ago she had a colonoscopy and when in [place], for rectal bleeding. States she had hemorrhoids.  In the past she has been on various PPIs, Prevacid and Nexium both helped however Prevacid caused diarrhea. Omeprazole did not help her symptoms. She is adamant about not just taking another pill for her symptoms. She wants to know what's wrong. SHE DIDN'T LIKE DR. PATEL BECAUSE "HE KEPT PUSHING PILLS ON ME". In the past she has been able to reasonably control her heartburn with her diet. Now she's having increased frequency of symptoms, solid food esophageal dysphagia, anorexia and early satiety. Complains of epigastric pain with and without meals. Denies NSAID or aspirin use.  Stools 2-3 times per day, always loose. Worse with lettuce. Remote past, constipation. Nocturnal BMs. No rectal bleeding. No melena. In the remote past she was given Levsin which helped at times.  Patient's 68-year-olds father recently passed away last month. She also lost a child prematurely, would've been 42 now.    No current outpatient prescriptions on file.   No current facility-administered medications for this visit.     Allergies as of 10/22/2016 - Review Complete 10/22/2016  Allergen Reaction Noted  . Doxycycline Nausea And Vomiting  10/27/2011  . Flagyl [metronidazole] Itching 01/17/2012  . Latex Itching 10/27/2011  . Penicillins Itching 10/27/2011     Past Medical History:  Diagnosis Date  . Acid reflux   . Allergy   . Anxiety   . Anxiety and depression   . Asthma   . Depression   . Fibroid   . Heart murmur   . Irritable bowel syndrome   . Migraines   . PONV (postoperative nausea and vomiting)   . Post-traumatic stress disorder   . PTSD (post-traumatic stress disorder)   . Ruptured cyst of ovary 2001  . Substance abuse    Past Surgical History:  Procedure Laterality Date  . ADENOIDECTOMY    . DILATION AND CURETTAGE OF UTERUS    . DILITATION & CURRETTAGE/HYSTROSCOPY WITH NOVASURE ABLATION N/A 10/01/2016   Procedure: DILATATION & CURETTAGE/HYSTEROSCOPY WITH NOVASURE ABLATION AND EXCISION OF VULVAR/GLUTEAL  CYST;  Surgeon: Jonnie Kind, MD;  Location: [facility];  Service: Gynecology;  Laterality: N/A;  . TONSILLECTOMY    . TUBAL LIGATION  2016  . WISDOM TOOTH EXTRACTION     Family History  Problem Relation Age of Onset  . Emphysema Maternal Grandmother   . Gout Maternal Grandmother   . Diabetes Maternal Grandmother   . Gout Father   . Alcohol abuse Father   . Arthritis Father   . COPD Father   . Depression Father   . Drug abuse Father   . Hyperlipidemia Father   . Hypertension Father   . Gout Mother   . Arthritis Mother     RA  . Diabetes  Mother   . Depression Mother   . Heart disease Mother   . Hyperlipidemia Mother   . Hypertension Mother   . Vision loss Mother   . Thyroid disease Maternal Aunt   . Diabetes Maternal Uncle   . Colon cancer Neg Hx   . Inflammatory bowel disease Neg Hx   . Celiac disease Neg Hx    Social History   Social History  . Marital status: Single    Spouse name: n/a  . Number of children: 1  . Years of education: 16   Social History Main Topics  . Smoking status: Never Smoker  . Smokeless tobacco: Never Used  . Alcohol use Yes     Comment: WINE  OCCASIONAL  . Drug use: Yes    Types: Marijuana  . Sexual activity: Not Currently    Birth control/ protection: Condom, Surgical   Other Topics Concern  . None   Social History Narrative   Single parent   One daughter - almost 2   Not working    ROS:  General: Negative for  weight loss, fever, chills, fatigue, weakness. See hpi Eyes: Negative for vision changes.  ENT: Negative for hoarseness, nasal congestion. See hpi CV: Negative for chest pain, angina, palpitations, dyspnea on exertion, peripheral edema.  Respiratory: Negative for dyspnea at rest, dyspnea on exertion, cough, sputum, wheezing.  GI: See history of present illness. GU:  Negative for dysuria, hematuria, urinary incontinence, urinary frequency, nocturnal urination.  MS: Negative for joint pain, low back pain.  Derm: Negative for rash or itching.  Neuro: Negative for weakness, abnormal sensation, seizure, frequent headaches, memory loss, confusion.  Psych: Negative for anxiety, depression, suicidal ideation, hallucinations.  Endo: Negative for unusual weight change.  Heme: Negative for bruising or bleeding. Allergy: Negative for rash or hives.    Physical Examination:   BP 127/83   Pulse 96   Temp 97.8 F (36.6 C) (Oral)   Ht 5' 6.5" (1.689 m)   Wt 186 lb 12.8 oz (84.7 kg)   LMP 09/28/2016 Comment: Bleeding now  BMI 29.70 kg/m    General: Well-nourished, well-developed in no acute distress.  Head: Normocephalic, atraumatic.   Eyes: Conjunctiva pink, no icterus. Mouth: Oropharyngeal mucosa moist and pink , no lesions erythema or exudate. Neck: Supple without thyromegaly, masses, or lymphadenopathy.  Lungs: Clear to auscultation bilaterally.  Heart: Regular rate and rhythm, no murmurs rubs or gallops.  Abdomen: Bowel sounds are normal, moderate epig tenderness, nondistended, no hepatosplenomegaly or masses, no abdominal bruits or    hernia , no rebound or guarding.   Extremities: No lower extremity  edema.  Neuro: Alert and oriented x 4 , grossly normal neurologically.  Skin: Warm and dry, no rash or jaundice.   Psych: Alert and cooperative, normal mood and affect.       Labs:  Lab Results  Component Value Date   CREATININE 0.79 09/27/2016   BUN 11 09/27/2016   NA 136 09/27/2016   K 3.5 09/27/2016   CL 105 09/27/2016   CO2 23 09/27/2016   Lab Results  Component Value Date   ALT 12 (L) 09/27/2016   AST 19 09/27/2016   ALKPHOS 49 09/27/2016   BILITOT 0.3 09/27/2016   Lab Results  Component Value Date   WBC 6.5 09/27/2016   HGB 12.5 09/27/2016   HCT 38.3 09/27/2016   MCV 91.6 09/27/2016   PLT 293 09/27/2016   Lab Results  Component Value Date   TSH 1.02 09/02/2016  Lab Results  Component Value Date   HGBA1C 5.4 09/02/2016    Imaging Studies: No results found.

## 2016-10-22 NOTE — Progress Notes (Signed)
cc'ed to pcp °

## 2016-10-22 NOTE — Assessment & Plan Note (Addendum)
Historically history of constipation the past. Over the last year she has had all loose stool, 3-4 daily. Associated with abdominal cramping at times. ddx includes IBS but need to rule out IBD, malignancy. Recommend ileocolonoscopy for further evaluation.  I have discussed the risks, alternatives, benefits with regards to but not limited to the risk of reaction to medication, bleeding, infection, perforation and the patient is agreeable to proceed. Written consent to be obtained.  CONSIDER SMALL BOWEL BIOPSY TO RULE OUT CELIAC.  Augment conscious sedation with phenergan 25mg  IV 45 minutes before procedure.

## 2016-10-22 NOTE — Assessment & Plan Note (Addendum)
41 year old female with chronic GERD eating back for more than 17 years, has been on PPIs in the past with prior upper endoscopy, results unavailable. Has been able to control typical heartburn with dietary measures for the most part up until recently she is having frequent heartburn, early satiety, epigastric pain, esophageal dysphagia. Reviews PPI by PCP. Given alarm symptoms, offered her EGD plus or minus esophageal dilation in the near future.  I have discussed the risks, alternatives, benefits with regards to but not limited to the risk of reaction to medication, bleeding, infection, perforation and the patient is agreeable to proceed. Written consent to be obtained.  Augment conscious sedation with Phenergan 25 mg IV 45 minutes before the procedure.  Briefly discussed potential need for PPI therapy only short-term based on EGD findings.

## 2016-10-28 ENCOUNTER — Telehealth: Payer: Self-pay

## 2016-10-28 NOTE — Telephone Encounter (Signed)
Pt called because she can not find a Public librarian for her daughter on 10/31/16 for the [employer]. We have moved her to 11/07/16 @ 10:30 pm. New instructions are in the mail and she is aware.

## 2016-10-29 NOTE — Telephone Encounter (Signed)
noted 

## 2016-11-07 ENCOUNTER — Encounter (HOSPITAL_COMMUNITY)
Admission: RE | Disposition: A | Discharge status: Home / Self Care | Payer: Self-pay | Source: Ambulatory Visit | Attending: Gastroenterology

## 2016-11-07 ENCOUNTER — Ambulatory Visit (HOSPITAL_COMMUNITY)
Admission: RE | Admit: 2016-11-07 | Discharge: 2016-11-07 | Disposition: A | Discharge status: Home / Self Care | Payer: Medicaid Other | Source: Ambulatory Visit | Attending: Gastroenterology | Admitting: Gastroenterology

## 2016-11-07 ENCOUNTER — Encounter (HOSPITAL_COMMUNITY): Payer: Self-pay | Admitting: *Deleted

## 2016-11-07 DIAGNOSIS — K2950 Unspecified chronic gastritis without bleeding: Secondary | ICD-10-CM | POA: Insufficient documentation

## 2016-11-07 DIAGNOSIS — K921 Melena: Secondary | ICD-10-CM | POA: Insufficient documentation

## 2016-11-07 DIAGNOSIS — R1013 Epigastric pain: Secondary | ICD-10-CM | POA: Diagnosis not present

## 2016-11-07 DIAGNOSIS — K2970 Gastritis, unspecified, without bleeding: Secondary | ICD-10-CM | POA: Diagnosis not present

## 2016-11-07 DIAGNOSIS — K222 Esophageal obstruction: Secondary | ICD-10-CM | POA: Diagnosis not present

## 2016-11-07 DIAGNOSIS — K648 Other hemorrhoids: Secondary | ICD-10-CM | POA: Diagnosis not present

## 2016-11-07 DIAGNOSIS — R197 Diarrhea, unspecified: Secondary | ICD-10-CM

## 2016-11-07 DIAGNOSIS — K219 Gastro-esophageal reflux disease without esophagitis: Secondary | ICD-10-CM

## 2016-11-07 DIAGNOSIS — J45909 Unspecified asthma, uncomplicated: Secondary | ICD-10-CM | POA: Diagnosis not present

## 2016-11-07 DIAGNOSIS — R1319 Other dysphagia: Secondary | ICD-10-CM

## 2016-11-07 DIAGNOSIS — Q438 Other specified congenital malformations of intestine: Secondary | ICD-10-CM | POA: Insufficient documentation

## 2016-11-07 DIAGNOSIS — K3189 Other diseases of stomach and duodenum: Secondary | ICD-10-CM | POA: Diagnosis not present

## 2016-11-07 DIAGNOSIS — R1310 Dysphagia, unspecified: Secondary | ICD-10-CM

## 2016-11-07 DIAGNOSIS — K5750 Diverticulosis of both small and large intestine without perforation or abscess without bleeding: Secondary | ICD-10-CM | POA: Diagnosis not present

## 2016-11-07 HISTORY — PX: COLONOSCOPY: SHX5424

## 2016-11-07 HISTORY — PX: ESOPHAGOGASTRODUODENOSCOPY: SHX5428

## 2016-11-07 HISTORY — PX: SAVORY DILATION: SHX5439

## 2016-11-07 SURGERY — COLONOSCOPY
Anesthesia: Moderate Sedation

## 2016-11-07 MED ORDER — PROMETHAZINE HCL 25 MG/ML IJ SOLN
INTRAMUSCULAR | Status: AC
  Filled 2016-11-07: qty 1

## 2016-11-07 MED ORDER — MINERAL OIL PO OIL
TOPICAL_OIL | ORAL | Status: AC
  Filled 2016-11-07: qty 30

## 2016-11-07 MED ORDER — MIDAZOLAM HCL 5 MG/5ML IJ SOLN
INTRAMUSCULAR | Status: DC | PRN
Start: 2016-11-07 — End: 2016-11-07
  Administered 2016-11-07 (×2): 2 mg via INTRAVENOUS
  Administered 2016-11-07 (×2): 1 mg via INTRAVENOUS

## 2016-11-07 MED ORDER — MIDAZOLAM HCL 5 MG/5ML IJ SOLN
INTRAMUSCULAR | Status: AC
  Filled 2016-11-07: qty 10

## 2016-11-07 MED ORDER — MEPERIDINE HCL 100 MG/ML IJ SOLN
INTRAMUSCULAR | Status: AC
  Filled 2016-11-07: qty 2

## 2016-11-07 MED ORDER — SODIUM CHLORIDE 0.9% FLUSH
INTRAVENOUS | Status: AC
  Filled 2016-11-07: qty 10

## 2016-11-07 MED ORDER — PROMETHAZINE HCL 25 MG/ML IJ SOLN
25.0000 mg | Freq: Once | INTRAMUSCULAR | Status: AC
  Administered 2016-11-07: 25 mg via INTRAVENOUS

## 2016-11-07 MED ORDER — SODIUM CHLORIDE 0.9 % IV SOLN
INTRAVENOUS | Status: DC
  Administered 2016-11-07: 09:00:00 via INTRAVENOUS

## 2016-11-07 MED ORDER — STERILE WATER FOR IRRIGATION IR SOLN
Status: DC | PRN
  Administered 2016-11-07: 2.5 mL

## 2016-11-07 MED ORDER — LIDOCAINE VISCOUS 2 % MT SOLN
OROMUCOSAL | Status: DC | PRN
  Administered 2016-11-07: 1 via OROMUCOSAL

## 2016-11-07 MED ORDER — LIDOCAINE VISCOUS 2 % MT SOLN
OROMUCOSAL | Status: AC
  Filled 2016-11-07: qty 15

## 2016-11-07 MED ORDER — MEPERIDINE HCL 100 MG/ML IJ SOLN
INTRAMUSCULAR | Status: DC | PRN
  Administered 2016-11-07: 50 mg via INTRAVENOUS
  Administered 2016-11-07 (×3): 25 mg via INTRAVENOUS

## 2016-11-07 NOTE — Discharge Instructions (Signed)
You did not have any polyps removed. You have internal hemorrhoids and diverticulosis in your right and left colon. 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  I BIOPSIED YOUR stomach, small bowel, AND RECTUM.    FOLLOW A LOW FAT/LACTOSE FREE DIET. MEATS SHOULD BE BAKED, BROILED, OR BOILED. AVOID FRIED FOODS. SEE INFO BELOW. YOU SHOULD CONSIDER A PLANT BASED DIET. CHECK OUT THIS BOOK, "PREVENT & REVERSE HEART DISEASE". CALDWELL ESSELSTYN, JR., MD/  IF YOU CONSUME DAIRY, ADD LACTASE 3 PILLS WITH MEALS UP TO THREE TIMES A DAY.  YOUR BIOPSY RESULTS WILL BE AVAILABLE IN MY CHART AFTER APR 7 AND MY OFFICE WILL CONTACT YOU IN 10-14 DAYS WITH YOUR RESULTS.   FOLLOW UP IN 4 MOS.  Next colonoscopy in 10 years.     ENDOSCOPY Care After Read the instructions outlined below and refer to this sheet in the next week. These discharge instructions provide you with general information on caring for yourself after you leave the hospital. While your treatment has been planned according to the most current medical practices available, unavoidable complications occasionally occur. If you have any problems or questions after discharge, call DR. Vail Vuncannon, [phone].  ACTIVITY  You may resume your regular activity, but move at a slower pace for the next 24 hours.   Take frequent rest periods for the next 24 hours.   Walking will help get rid of the air and reduce the bloated feeling in your belly (abdomen).   No driving for 24 hours (because of the medicine (anesthesia) used during the test).   You may shower.   Do not sign any important legal documents or operate any machinery for 24 hours (because of the anesthesia used during the test).    NUTRITION  Drink plenty of fluids.   You may resume your normal diet as instructed by your doctor.   Begin with a light meal and progress to your normal diet. Heavy or fried foods are harder to digest and may make you feel  sick to your stomach (nauseated).   Avoid alcoholic beverages for 24 hours or as instructed.    MEDICATIONS  You may resume your normal medications.   WHAT YOU CAN EXPECT TODAY  Some feelings of bloating in the abdomen.   Passage of more gas than usual.   Spotting of blood in your stool or on the toilet paper  .  IF YOU HAD POLYPS REMOVED DURING THE ENDOSCOPY:  Eat a soft diet IF YOU HAVE NAUSEA, BLOATING, ABDOMINAL PAIN, OR VOMITING.    FINDING OUT THE RESULTS OF YOUR TEST Not all test results are available during your visit. DR. Oneida Alar WILL CALL YOU WITHIN 14 DAYS OF YOUR PROCEDUE WITH YOUR RESULTS. Do not assume everything is normal if you have not heard from DR. Lyrik Dockstader, CALL HER OFFICE AT [phone].  SEEK IMMEDIATE MEDICAL ATTENTION AND CALL THE OFFICE: [phone] IF:  You have more than a spotting of blood in your stool.   Your belly is swollen (abdominal distention).   You are nauseated or vomiting.   You have a temperature over 101F.   You have abdominal pain or discomfort that is severe or gets worse throughout the day.   Lactose Free Diet Lactose is a carbohydrate that is found mainly in milk and milk products, as well as in foods with added milk or whey. Lactose must be digested by the enzyme in order to be used by the body. Lactose intolerance  occurs when there is a shortage of lactase. When your body is not able to digest lactose, you may feel sick to your stomach (nausea), bloating, cramping, gas and diarrhea.  There are many dairy products that may be tolerated better than milk by some people:  The use of cultured dairy products such as yogurt, buttermilk, cottage cheese, and sweet acidophilus milk (Kefir) for lactase-deficient individuals is usually well tolerated. This is because the healthy bacteria help digest lactose.   Lactose-hydrolyzed milk (Lactaid) contains 40-90% less lactose than milk and may also be well tolerated.   SPECIAL  NOTES  Lactose is a carbohydrates. The major food source is dairy products. Reading food labels is important. Many products contain lactose even when they are not made from milk. Look for the following words: whey, milk solids, dry milk solids, nonfat dry milk powder. Typical sources of lactose other than dairy products include breads, candies, cold cuts, prepared and processed foods, and commercial sauces and gravies.   All foods must be prepared without milk, cream, or other dairy foods.   Soy milk and lactose-free supplements (LACTASE) may be used as an alternative to milk.   FOOD GROUP ALLOWED/RECOMMENDED AVOID/USE SPARINGLY  BREADS / STARCHES 4 servings or more* Breads and rolls made without milk. [place], [place], or [place] bread. Breads and rolls that contain milk. Prepared mixes such as muffins, biscuits, waffles, pancakes. Sweet rolls, donuts, [place] toast (if made with milk or lactose).  Crackers: Soda crackers, graham crackers. Any crackers prepared without lactose. Zwieback crackers, corn curls, or any that contain lactose.  Cereals: Cooked or dry cereals prepared without lactose (read labels). Cooked or dry cereals prepared with lactose (read labels). Total, Cocoa Krispies. Special K.  Potatoes / Pasta / Rice: Any prepared without milk or lactose. Popcorn. Instant potatoes, frozen [place] fries, scalloped or au gratin potatoes.  VEGETABLES 2 servings or more Fresh, frozen, and canned vegetables. Creamed or breaded vegetables. Vegetables in a cheese sauce or with lactose-containing margarines.  FRUIT 2 servings or more All fresh, canned, or frozen fruits that are not processed with lactose. Any canned or frozen fruits processed with lactose.  MEAT & SUBSTITUTES 2 servings or more (4 to 6 oz. total per day) Plain beef, chicken, fish, [place], lamb, veal, pork, or ham. Kosher prepared meat products. Strained or junior meats that do not contain milk. Eggs, soy meat substitutes, nuts.  Scrambled eggs, omelets, and souffles that contain milk. Creamed or breaded meat, fish, or fowl. Sausage products such as wieners, liver sausage, or cold cuts that contain milk solids. Cheese, cottage cheese, or cheese spreads.  MILK None. (See BEVERAGES for milk substitutes. See DESSERTS for ice cream and frozen desserts.) Milk (whole, 2%, skim, or chocolate). Evaporated, powdered, or condensed milk; malted milk.  SOUPS & COMBINATION FOODS Bouillon, broth, vegetable soups, clear soups, consomms. Homemade soups made with allowed ingredients. Combination or prepared foods that do not contain milk or milk products (read labels). Cream soups, chowders, commercially prepared soups containing lactose. Macaroni and cheese, pizza. Combination or prepared foods that contain milk or milk products.  DESSERTS & SWEETS In moderation Water and fruit ices; gelatin; angel food cake. Homemade cookies, pies, or cakes made from allowed ingredients. Pudding (if made with water or a milk substitute). Lactose-free tofu desserts. Sugar, honey, corn syrup, jam, jelly; marmalade; molasses (beet sugar); Pure sugar candy; marshmallows. Ice cream, ice milk, sherbet, custard, pudding, frozen yogurt. Commercial cake and cookie mixes. Desserts that contain chocolate. Pie crust made with  milk-containing margarine; reduced-calorie desserts made with a sugar substitute that contains lactose. Toffee, peppermint, butterscotch, chocolate, caramels.  FATS & OILS In moderation Butter (as tolerated; contains very small amounts of lactose). Margarines and dressings that do not contain milk, Vegetable oils, shortening, Miracle Whip, mayonnaise, nondairy cream & whipped toppings without lactose or milk solids added (examples: Coffee Rich, Carnation Coffeemate, Rich's Whipped Topping, PolyRich). Berniece Salines. Margarines and salad dressings containing milk; cream, cream cheese; peanut butter with added milk solids, sour cream, chip dips, made with  sour cream.  BEVERAGES Carbonated drinks; tea; coffee and freeze-dried coffee; some instant coffees (check labels). Fruit drinks; fruit and vegetable juice; Rice or Soy milk. Ovaltine, hot chocolate. Some cocoas; some instant coffees; instant iced teas; powdered fruit drinks (read labels).   CONDIMENTS / MISCELLANEOUS Soy sauce, carob powder, olives, gravy made with water, baker's cocoa, pickles, pure seasonings and spices, wine, pure monosodium glutamate, catsup, mustard. Some chewing gums, chocolate, some cocoas. Certain antibiotics and vitamin / mineral preparations. Spice blends if they contain milk products. MSG extender. Artificial sweeteners that contain lactose such as Equal (Nutra-Sweet) and Sweet 'n Low. Some nondairy creamers (read labels).   SAMPLE MENU*  Breakfast   Orange Juice.  Banana.   Bran flakes.   Nondairy Creamer.  [place] Bread (toasted).   Butter or milk-free margarine.   Coffee or tea.    Noon Meal   Chicken Breast.  Rice.   Green beans.   Butter or milk-free margarine.  Fresh melon.   Coffee or tea.    Evening Meal   Roast Beef.  Baked potato.   Butter or milk-free margarine.   Broccoli.   Lettuce salad with vinegar and oil dressing.  [employer]   Coffee or tea.     Low-Fat Diet BREADS, CEREALS, PASTA, RICE, DRIED PEAS, AND BEANS These products are high in carbohydrates and most are low in fat. Therefore, they can be increased in the diet as substitutes for fatty foods. They too, however, contain calories and should not be eaten in excess. Cereals can be eaten for snacks as well as for breakfast.   FRUITS AND VEGETABLES It is good to eat fruits and vegetables. Besides being sources of fiber, both are rich in vitamins and some minerals. They help you get the daily allowances of these nutrients. Fruits and vegetables can be used for snacks and desserts.  MEATS Limit lean meat, chicken, [place], and fish to no more than 6  ounces per day. Beef, Pork, and Lamb Use lean cuts of beef, pork, and lamb. Lean cuts include:  Extra-lean ground beef.  Arm roast.  Sirloin tip.  Center-cut ham.  Round steak.  Loin chops.  Rump roast.  Tenderloin.  Trim all fat off the outside of meats before cooking. It is not necessary to severely decrease the intake of red meat, but lean choices should be made. Lean meat is rich in protein and contains a highly absorbable form of iron. Premenopausal women, in particular, should avoid reducing lean red meat because this could increase the risk for low red blood cells (iron-deficiency anemia).  Chicken and [place] These are good sources of protein. The fat of poultry can be reduced by removing the skin and underlying fat layers before cooking. Chicken and [place] can be substituted for lean red meat in the diet. Poultry should not be fried or covered with high-fat sauces. Fish and Shellfish Fish is a good source of protein. Shellfish contain cholesterol, but they usually are low  in saturated fatty acids. The preparation of fish is important. Like chicken and [place], they should not be fried or covered with high-fat sauces. EGGS Egg whites contain no fat or cholesterol. They can be eaten often. Try 1 to 2 egg whites instead of whole eggs in recipes or use egg substitutes that do not contain yolk. MILK AND DAIRY PRODUCTS Use skim or 1% milk instead of 2% or whole milk. Decrease whole milk, natural, and processed cheeses. Use nonfat or low-fat (2%) cottage cheese or low-fat cheeses made from vegetable oils. Choose nonfat or low-fat (1 to 2%) yogurt. Experiment with evaporated skim milk in recipes that call for heavy cream. Substitute low-fat yogurt or low-fat cottage cheese for sour cream in dips and salad dressings. Have at least 2 servings of low-fat dairy products, such as 2 glasses of skim (or 1%) milk each day to help get your daily calcium intake. FATS AND OILS Reduce the total intake of  fats, especially saturated fat. Butterfat, lard, and beef fats are high in saturated fat and cholesterol. These should be avoided as much as possible. Vegetable fats do not contain cholesterol, but certain vegetable fats, such as coconut oil, palm oil, and palm kernel oil are very high in saturated fats. These should be limited. These fats are often used in bakery goods, processed foods, popcorn, oils, and nondairy creamers. Vegetable shortenings and some peanut butters contain hydrogenated oils, which are also saturated fats. Read the labels on these foods and check for saturated vegetable oils. Unsaturated vegetable oils and fats do not raise blood cholesterol. However, they should be limited because they are fats and are high in calories. Total fat should still be limited to 30% of your daily caloric intake. Desirable liquid vegetable oils are corn oil, cottonseed oil, olive oil, canola oil, safflower oil, soybean oil, and sunflower oil. Peanut oil is not as good, but small amounts are acceptable. Buy a heart-healthy tub margarine that has no partially hydrogenated oils in the ingredients. Mayonnaise and salad dressings often are made from unsaturated fats, but they should also be limited because of their high calorie and fat content. Seeds, nuts, peanut butter, olives, and avocados are high in fat, but the fat is mainly the unsaturated type. These foods should be limited mainly to avoid excess calories and fat. OTHER EATING TIPS Snacks  Most sweets should be limited as snacks. They tend to be rich in calories and fats, and their caloric content outweighs their nutritional value. Some good choices in snacks are graham crackers, melba toast, soda crackers, bagels (no egg), English muffins, fruits, and vegetables. These snacks are preferable to snack crackers, [place] fries, TORTILLA CHIPS, and POTATO chips. Popcorn should be air-popped or cooked in small amounts of liquid vegetable oil. Desserts Eat fruit,  low-fat yogurt, and fruit ices instead of pastries, cake, and cookies. Sherbet, angel food cake, gelatin dessert, frozen low-fat yogurt, or other frozen products that do not contain saturated fat (pure fruit juice bars, frozen ice pops) are also acceptable.  COOKING METHODS Choose those methods that use little or no fat. They include: Poaching.  Braising.  Steaming.  Grilling.  Baking.  Stir-frying.  Broiling.  Microwaving.  Foods can be cooked in a nonstick pan without added fat, or use a nonfat cooking spray in regular cookware. Limit fried foods and avoid frying in saturated fat. Add moisture to lean meats by using water, broth, cooking wines, and other nonfat or low-fat sauces along with the cooking methods mentioned above.  Soups and stews should be chilled after cooking. The fat that forms on top after a few hours in the refrigerator should be skimmed off. When preparing meals, avoid using excess salt. Salt can contribute to raising blood pressure in some people.  EATING AWAY FROM HOME Order entres, potatoes, and vegetables without sauces or butter. When meat exceeds the size of a deck of cards (3 to 4 ounces), the rest can be taken home for another meal. Choose vegetable or fruit salads and ask for low-calorie salad dressings to be served on the side. Use dressings sparingly. Limit high-fat toppings, such as bacon, crumbled eggs, cheese, sunflower seeds, and olives. Ask for heart-healthy tub margarine instead of butter.    Diverticulosis Diverticulosis is a common condition that develops when small pouches (diverticula) form in the wall of the colon. The risk of diverticulosis increases with age. It happens more often in people who eat a low-fiber diet. Most individuals with diverticulosis have no symptoms. Those individuals with symptoms usually experience belly (abdominal) pain, constipation, or loose stools (diarrhea).  HOME CARE INSTRUCTIONS  Increase the amount of fiber in your  diet as directed by your caregiver or dietician. This may reduce symptoms of diverticulosis.   Drink at least 6 to 8 glasses of water each day to prevent constipation.   Try not to strain when you have a bowel movement.   Avoiding nuts and seeds to prevent complications is NOT NECESSARY.   FOODS HAVING HIGH FIBER CONTENT INCLUDE:  Fruits. Apple, peach, pear, tangerine, raisins, prunes.   Vegetables. Brussels sprouts, asparagus, broccoli, cabbage, carrot, cauliflower, romaine lettuce, spinach, summer squash, tomato, winter squash, zucchini.   Starchy Vegetables. Baked beans, kidney beans, lima beans, split peas, lentils, potatoes (with skin).   Grains. Whole wheat bread, brown rice, bran flake cereal, plain oatmeal, white rice, shredded wheat, bran muffins.   SEEK IMMEDIATE MEDICAL CARE IF:  You develop increasing pain or severe bloating.   You have an oral temperature above 101F.   You develop vomiting or bowel movements that are bloody or black.   Hemorrhoids Hemorrhoids are dilated (enlarged) veins around the rectum. Sometimes clots will form in the veins. This makes them swollen and painful. These are called thrombosed hemorrhoids. Causes of hemorrhoids include:  Constipation.   Straining to have a bowel movement.   HEAVY LIFTING  HOME CARE INSTRUCTIONS  Eat a well balanced diet and drink 6 to 8 glasses of water every day to avoid constipation. You may also use a bulk laxative.   Avoid straining to have bowel movements.   Keep anal area dry and clean.   Do not use a donut shaped pillow or sit on the toilet for long periods. This increases blood pooling and pain.   Move your bowels when your body has the urge; this will require less straining and will decrease pain and pressure.

## 2016-11-07 NOTE — Op Note (Addendum)
[facility] Patient Name: Brooke Eaton Procedure Date: 11/07/2016 10:29 AM MRN: [id number] Date of Birth: Sep 08, 1975 Attending MD: Barney Drain , MD CSN: [id number] Age: 41 Admit Type: Outpatient Procedure:                Upper GI endoscopy WITH COLD FORCEPS                            BIOPSY/ESOPHAGEAL DILATION Indications:              Epigastric abdominal pain, Functional Dyspepsia,                            Dysphagia, Diarrhea. PT DOES NOT WANT TO TAKE                            MEDICINE. Providers:                Barney Drain, MD, Charlyne Petrin RN, RN, Bonnetta Barry, Technician Referring MD:             Lysle Morales Medicines:                TCS + Meperidine 25 mg IV, Midazolam 2 mg IV Complications:            No immediate complications. Estimated Blood Loss:     Estimated blood loss was minimal. Procedure:                Pre-Anesthesia Assessment:                           - Prior to the procedure, a History and Physical                            was performed, and patient medications and                            allergies were reviewed. The patient's tolerance of                            previous anesthesia was also reviewed. The risks                            and benefits of the procedure and the sedation                            options and risks were discussed with the patient.                            All questions were answered, and informed consent                            was obtained. Prior Anticoagulants: The patient has  taken no previous anticoagulant or antiplatelet                            agents. ASA Grade Assessment: II - A patient with                            mild systemic disease. After reviewing the risks                            and benefits, the patient was deemed in                            satisfactory condition to undergo the procedure.                            After  obtaining informed consent, the endoscope was                            passed under direct vision. Throughout the                            procedure, the patient's blood pressure, pulse, and                            oxygen saturations were monitored continuously. The                            [id number]) scope was introduced through the                            mouth, and advanced to the second part of duodenum.                            The upper GI endoscopy was somewhat difficult due                            to the patient's agitation. Successful completion                            of the procedure was aided by receiving assistance                            from additional staff. The patient tolerated the                            procedure fairly well. Scope In: 10:32:18 AM Scope Out: 10:42:58 AM Total Procedure Duration: 0 hours 10 minutes 40 seconds  Findings:      A non-obstructing Schatzki ring (acquired) was found at the       gastroesophageal junction. A guidewire was placed and the scope was       withdrawn. Dilation was performed with a Savary dilator with mild       resistance at 16 mm and 17 mm.      Patchy mild  inflammation characterized by congestion (edema) and       erythema was found in the gastric antrum. Biopsies were taken with a       cold forceps for Helicobacter pylori testing.      The duodenal bulb was normal. Biopsies for histology were taken with a       cold forceps for evaluation of celiac disease.      A large non-bleeding diverticulum was found in the second portion of the       duodenum. IT INVOLVES THE AMPULLA. Impression:               - Non-obstructing Schatzki ring. Dilated.                           - MILD Gastritis. Biopsied.                           -LARGE DUODENAL DIVERTICULUM INVOLVING THE AMPULLA Moderate Sedation:      Moderate (conscious) sedation was administered by the endoscopy nurse       and supervised by the  endoscopist. The following parameters were       monitored: oxygen saturation, heart rate, blood pressure, and response       to care. Total physician intraservice time was 51 minutes. Recommendation:           - Await pathology results.                           - High fiber diet, low fat diet and lactose free                            diet.                           - Continue present medications.                           - Return to my office in 4 months.                           - Patient has a contact number available for                            emergencies. The signs and symptoms of potential                            delayed complications were discussed with the                            patient. Return to normal activities tomorrow.                            Written discharge instructions were provided to the                            patient. Procedure Code(s):        --- Professional ---  [phone], Esophagogastroduodenoscopy, flexible,                            transoral; with insertion of guide wire followed by                            passage of dilator(s) through esophagus over guide                            wire                           43239, Esophagogastroduodenoscopy, flexible,                            transoral; with biopsy, single or multiple                           99152, Moderate sedation services provided by the                            same physician or other qualified health care                            professional performing the diagnostic or                            therapeutic service that the sedation supports,                            requiring the presence of an independent trained                            observer to assist in the monitoring of the                            patient's level of consciousness and physiological                            status; initial 15 minutes of intraservice time,                             patient age 76 years or older                           [phone], Moderate sedation services; each additional                            15 minutes intraservice time                           99153, Moderate sedation services; each additional                            15 minutes intraservice time Diagnosis Code(s):        ---  Professional ---                           K22.2, Esophageal obstruction                           K29.70, Gastritis, unspecified, without bleeding                           R10.13, Epigastric pain                           K30, Functional dyspepsia                           R13.10, Dysphagia, unspecified                           R19.7, Diarrhea, unspecified CPT copyright 2016 [employer]. All rights reserved. The codes documented in this report are preliminary and upon coder review may  be revised to meet current compliance requirements. Barney Drain, MD Barney Drain, MD 11/07/2016 11:02:09 AM This report has been signed electronically. Number of Addenda: 0

## 2016-11-07 NOTE — Op Note (Signed)
[facility] Patient Name: Brooke Eaton Procedure Date: 11/07/2016 9:34 AM MRN: [id number] Date of Birth: [birth year] Attending MD: Barney Drain , MD CSN: [id number] Age: 41 Admit Type: Outpatient Procedure:                Colonoscopy WITH COLD FORCEPS BIOPSY Indications:              Epigastric abdominal pain, Clinically significant                            diarrhea of unexplained origin, Hematochezia Providers:                Barney Drain, MD, Charlyne Petrin RN, RN, Bonnetta Barry, Technician Referring MD:             Lysle Morales Medicines:                Promethazine 25 mg IV, Meperidine 100 mg IV,                            Midazolam 4 mg IV Complications:            No immediate complications. Estimated Blood Loss:     Estimated blood loss was minimal. Procedure:                Pre-Anesthesia Assessment:                           - Prior to the procedure, a History and Physical                            was performed, and patient medications and                            allergies were reviewed. The patient's tolerance of                            previous anesthesia was also reviewed. The risks                            and benefits of the procedure and the sedation                            options and risks were discussed with the patient.                            All questions were answered, and informed consent                            was obtained. Prior Anticoagulants: The patient has                            taken no previous anticoagulant or antiplatelet  agents. ASA Grade Assessment: II - A patient with                            mild systemic disease. After reviewing the risks                            and benefits, the patient was deemed in                            satisfactory condition to undergo the procedure.                            After obtaining informed consent, the colonoscope                  was passed under direct vision. Throughout the                            procedure, the patient's blood pressure, pulse, and                            oxygen saturations were monitored continuously. The                            EC-[id number] ([id number]) scope was introduced through                            the anus and advanced to the 10 cm into the ileum.                            The colonoscopy was technically difficult and                            complex due to significant looping. Successful                            completion of the procedure was aided by                            straightening and shortening the scope to obtain                            bowel loop reduction and COLOWRAP. The patient                            tolerated the procedure fairly well. The quality of                            the bowel preparation was excellent. The terminal                            ileum, ileocecal valve, appendiceal orifice, and  rectum were photographed. Scope In: 10:04:20 AM Scope Out: 10:24:37 AM Scope Withdrawal Time: 0 hours 12 minutes 41 seconds  Total Procedure Duration: 0 hours 20 minutes 17 seconds  Findings:      The terminal ileum appeared normal.      The recto-sigmoid colon, sigmoid colon and descending colon were       significantly redundant. Biopsies for histology were taken with a cold       forceps from the cecum, ascending colon, transverse colon and descending       colon for evaluation of microscopic colitis.      Multiple small and large-mouthed diverticula were found in the entire       colon.      Internal hemorrhoids were found during retroflexion. The hemorrhoids       were small. Impression:               - The examined portion of the ileum was normal.                           - EXTREMELY Redundant colon.                           - Diverticulosis in the entire examined colon.                           -  Internal hemorrhoids. Moderate Sedation:      Moderate (conscious) sedation was personally administered by the       endoscopist. The following parameters were monitored: oxygen saturation,       heart rate, blood pressure, and response to care. Total physician       intraservice time was 51 minutes. Recommendation:           - High fiber/LACTOSE FREE/LOW FAT diet.                           - Continue present medications.                           - Await pathology results.                           - Repeat colonoscopy in 10 years for surveillance.                           - Return to GI office in 4 months.                           - Patient has a contact number available for                            emergencies. The signs and symptoms of potential                            delayed complications were discussed with the                            patient. Return to normal activities tomorrow.  Written discharge instructions were provided to the                            patient. Procedure Code(s):        --- Professional ---                           [phone], Colonoscopy, flexible; with biopsy, single                            or multiple                           99152, Moderate sedation services provided by the                            same physician or other qualified health care                            professional performing the diagnostic or                            therapeutic service that the sedation supports,                            requiring the presence of an independent trained                            observer to assist in the monitoring of the                            patient's level of consciousness and physiological                            status; initial 15 minutes of intraservice time,                            patient age 65 years or older                           [phone], Moderate sedation services; each additional                             15 minutes intraservice time                           99153, Moderate sedation services; each additional                            15 minutes intraservice time Diagnosis Code(s):        --- Professional ---                           K64.8, Other hemorrhoids  R10.13, Epigastric pain                           R19.7, Diarrhea, unspecified                           K92.1, Melena (includes Hematochezia)                           K57.30, Diverticulosis of large intestine without                            perforation or abscess without bleeding                           Q43.8, Other specified congenital malformations of                            intestine CPT copyright 2016 American Medical Association. All rights reserved. The codes documented in this report are preliminary and upon coder review may  be revised to meet current compliance requirements. Barney Drain, MD Barney Drain, MD 11/07/2016 10:55:22 AM This report has been signed electronically. Number of Addenda: 0

## 2016-11-07 NOTE — H&P (Signed)
Primary Care Physician:  Raylene Everts, MD Primary Gastroenterologist:  Dr. Oneida Alar  Pre-Procedure History & Physical: HPI:  Brooke Eaton is a 41 y.o. female here for Diarrhea/dyspepsia.  Past Medical History:  Diagnosis Date  . Acid reflux   . Allergy   . Anxiety   . Anxiety and depression   . Asthma   . Depression   . Fibroid   . Heart murmur   . Irritable bowel syndrome   . Migraines   . PONV (postoperative nausea and vomiting)   . Post-traumatic stress disorder   . PTSD (post-traumatic stress disorder)   . Ruptured cyst of ovary 2001  . Substance abuse     Past Surgical History:  Procedure Laterality Date  . ADENOIDECTOMY    . DILATION AND CURETTAGE OF UTERUS    . DILITATION & CURRETTAGE/HYSTROSCOPY WITH NOVASURE ABLATION N/A 10/01/2016   Procedure: DILATATION & CURETTAGE/HYSTEROSCOPY WITH NOVASURE ABLATION AND EXCISION OF VULVAR/GLUTEAL  CYST;  Surgeon: Jonnie Kind, MD;  Location: [facility];  Service: Gynecology;  Laterality: N/A;  . TONSILLECTOMY    . TUBAL LIGATION  2016  . WISDOM TOOTH EXTRACTION      Prior to Admission medications   Medication Sig Start Date End Date Taking? Authorizing Provider  Na Sulfate-K Sulfate-Mg Sulf (SUPREP BOWEL PREP KIT) 17.5-3.13-1.6 GM/180ML SOLN Take 1 kit by mouth as directed. 10/22/16  Yes Danie Binder, MD  albuterol (PROVENTIL HFA;VENTOLIN HFA) 108 (90 Base) MCG/ACT inhaler Inhale 2 puffs into the lungs every 6 (six) hours as needed for wheezing or shortness of breath.    Historical Provider, MD  diphenhydrAMINE (BENADRYL) 25 MG tablet Take 50 mg by mouth every 6 (six) hours as needed for allergies.    Historical Provider, MD    Allergies as of 10/22/2016 - Review Complete 10/22/2016  Allergen Reaction Noted  . Doxycycline Nausea And Vomiting 10/27/2011  . Flagyl [metronidazole] Itching 01/17/2012  . Latex Itching 10/27/2011  . Penicillins Itching 10/27/2011    Family History  Problem Relation Age of Onset  . Emphysema  Maternal Grandmother   . Gout Maternal Grandmother   . Diabetes Maternal Grandmother   . Gout Father   . Alcohol abuse Father   . Arthritis Father   . COPD Father   . Depression Father   . Drug abuse Father   . Hyperlipidemia Father   . Hypertension Father   . Gout Mother   . Arthritis Mother     RA  . Diabetes Mother   . Depression Mother   . Heart disease Mother   . Hyperlipidemia Mother   . Hypertension Mother   . Vision loss Mother   . Thyroid disease Maternal Aunt   . Diabetes Maternal Uncle   . Colon cancer Neg Hx   . Inflammatory bowel disease Neg Hx   . Celiac disease Neg Hx     Social History   Social History  . Marital status: Single    Spouse name: n/a  . Number of children: 1  . Years of education: 52   Occupational History  . Not on file.   Social History Main Topics  . Smoking status: Never Smoker  . Smokeless tobacco: Never Used  . Alcohol use Yes     Comment: WINE OCCASIONAL  . Drug use: Yes    Types: Marijuana  . Sexual activity: Not Currently    Birth control/ protection: Condom, Surgical   Other Topics Concern  . Not on file  Social History Narrative   Single parent   One daughter - almost 2   Not working    Review of Systems: See HPI, otherwise negative ROS   Physical Exam: BP 103/70   Pulse 78   Temp 98.9 F (37.2 C) (Oral)   Resp 17   Ht 5' 6.5" (1.689 m)   Wt 186 lb (84.4 kg)   LMP 10/31/2016 (Exact Date)   SpO2 100%   BMI 29.57 kg/m  General:   Alert,  pleasant and cooperative in NAD Head:  Normocephalic and atraumatic. Neck:  Supple; Lungs:  Clear throughout to auscultation.    Heart:  Regular rate and rhythm. Abdomen:  Soft, nontender and nondistended. Normal bowel sounds, without guarding, and without rebound.   Neurologic:  Alert and  oriented x4;  grossly normal neurologically.  Impression/Plan:     Diarrhea/dyspepsia  PLAN: TCS/EGDTODAY WITH BIOPSY. DISCUSSED PROCEDURE, BENEFITS, & RISKS: < 1% chance  of medication reaction, bleeding, perforation, or rupture of spleen/liver.

## 2016-11-12 ENCOUNTER — Encounter (HOSPITAL_COMMUNITY): Payer: Self-pay | Admitting: Gastroenterology

## 2016-11-13 ENCOUNTER — Telehealth: Payer: Self-pay | Admitting: Gastroenterology

## 2016-11-13 NOTE — Telephone Encounter (Signed)
Please call pt. Her colon and small bowel biopsies are normal. HER stomach Bx shows mild gastritis.    FOLLOW A LOW FAT/LACTOSE FREE DIET. MEATS SHOULD BE BAKED, BROILED, OR BOILED. AVOID FRIED FOODS.  CONSIDER A PLANT BASED DIET.  IF YOU CONSUME DAIRY, ADD LACTASE 3 PILLS WITH MEALS UP TO THREE TIMES A DAY.  FOLLOW UP IN 4 MOS E30 DIARRHEA/DYSPEPSIA.  Next colonoscopy in 10 years.

## 2016-11-14 ENCOUNTER — Encounter: Payer: Self-pay | Admitting: Gastroenterology

## 2016-11-14 NOTE — Telephone Encounter (Signed)
Left Vm to call and also printed the info and mailed to pt.

## 2016-11-14 NOTE — Telephone Encounter (Signed)
APPT MADE AND ON RECALL  °

## 2016-11-14 NOTE — Telephone Encounter (Signed)
Pt is aware of results. 

## 2017-03-07 ENCOUNTER — Ambulatory Visit (INDEPENDENT_AMBULATORY_CARE_PROVIDER_SITE_OTHER): Payer: Medicaid Other | Admitting: Family Medicine

## 2017-03-07 ENCOUNTER — Encounter: Payer: Self-pay | Admitting: Family Medicine

## 2017-03-07 VITALS — BP 120/70 | HR 78 | Temp 98.9°F | Resp 16 | Ht 66.5 in | Wt 190.0 lb

## 2017-03-07 DIAGNOSIS — S0501XA Injury of conjunctiva and corneal abrasion without foreign body, right eye, initial encounter: Secondary | ICD-10-CM

## 2017-03-07 MED ORDER — ERYTHROMYCIN 5 MG/GM OP OINT: 1.0000 "application " | TOPICAL_OINTMENT | Freq: Three times a day (TID) | OPHTHALMIC | 0 refills | Status: DC

## 2017-03-07 NOTE — Patient Instructions (Addendum)
Use eye ointment 3 times a day  See eye doctor at [facility] on Monday morning  Return to work on Wednesday  To ER if worse  Dr Jacquiline Doe [facility] [place] [address]  [phone]

## 2017-03-07 NOTE — Progress Notes (Signed)
Chief Complaint  Patient presents with  . Eye Pain    swelling, daughter 'high fived' in eye, is gunky and irritated   41 y o daughter accidentally hit her in the  Right eye.  forcefully with her palm as she tried to "high five" and missed. She hs immediate severe pain "dropped me to my knees" and had to use a cold compress and wait a few min before she could open eye. Today it is red and has some yellow discharge.  It feels grainy.  Vision is OK, hurts with R lateral gaze.  Patient Active Problem List   Diagnosis Date Noted  . Abdominal pain, epigastric 10/22/2016  . Esophageal dysphagia 10/22/2016  . Diarrhea 10/22/2016  . Asthma 09/02/2016  . Marijuana use, continuous 09/02/2016  . Menorrhagia with regular cycle 08/16/2016  . Dysmenorrhea 01/17/2012  . PMS (premenstrual syndrome) 01/17/2012  . Anxiety and depression   . Post-traumatic stress disorder   . Ruptured cyst of ovary   . Fibroid   . Irritable bowel syndrome   . GERD (gastroesophageal reflux disease)     Outpatient Encounter Prescriptions as of 03/07/2017  Medication Sig  . albuterol (PROVENTIL HFA;VENTOLIN HFA) 108 (90 Base) MCG/ACT inhaler Inhale 2 puffs into the lungs every 6 (six) hours as needed for wheezing or shortness of breath.  . diphenhydrAMINE (BENADRYL) 25 MG tablet Take 50 mg by mouth every 6 (six) hours as needed for allergies.  Marland Kitchen erythromycin [facility]) ophthalmic ointment Place 1 application into the right eye 3 (three) times daily.   No facility-administered encounter medications on file as of 03/07/2017.     Allergies  Allergen Reactions  . Doxycycline Nausea And Vomiting    Cannot tolerate oral doxycycline   . Flagyl [Metronidazole] Itching  . Latex Itching  . Penicillins Itching    Has patient had a PCN reaction causing immediate rash, facial/tongue/throat swelling, SOB or lightheadedness with hypotension: Yes Has patient had a PCN reaction causing severe rash involving mucus membranes or  skin necrosis: No Has patient had a PCN reaction that required hospitalization No Has patient had a PCN reaction occurring within the last 10 years: No If all of the above answers are "NO", then may proceed with Cephalosporin use.     Review of Systems  Constitutional: Negative for activity change, appetite change and fever.  HENT: Negative for congestion, postnasal drip and rhinorrhea.   Eyes: Positive for discharge, redness and itching. Negative for photophobia and visual disturbance.  Respiratory: Negative for cough and shortness of breath.   All other systems reviewed and are negative.   BP 120/70 (BP Location: Left Arm, Patient Position: Sitting, Cuff Size: Normal)   Pulse 78   Temp 98.9 F (37.2 C) (Other (Comment))   Resp 16   Ht 5' 6.5" (1.689 m)   Wt 190 lb (86.2 kg)   LMP 02/04/2017 (Approximate)   SpO2 98%   BMI 30.21 kg/m   Physical Exam  Constitutional: She appears well-developed and well-nourished. She appears distressed.  Mild irritation R eye, open   HENT:  Head: Normocephalic and atraumatic.  Mouth/Throat: Oropharynx is clear and moist.  Eyes: Pupils are equal, round, and reactive to light. EOM and lids are normal. Lids are everted and swept, no foreign bodies found. Right eye exhibits discharge. Right conjunctiva is injected.  Fundoscopic exam:      The right eye shows no hemorrhage.  Right eye with mild injection, mild tearing, no purulence.  Lids normal, no  FB.  Fundus clear,  No blood in anterior chamber    ASSESSMENT/PLAN:  1. Cornea abrasion, right, initial encounter  Called Opthalmologic on call Dr Craige Cotta.  He feels she has had a corneal irritation/injury that will recover with conservative care.  He recommended the eye ointment and follow up.  Patient Instructions  Use eye ointment 3 times a day  See eye doctor at [facility] on Monday morning  Return to work on Wednesday  To ER if worse  Dr Jacquiline Doe [facility] [address] [place]  [place] [address]  [phone]   Brooke Everts, MD

## 2017-03-18 ENCOUNTER — Encounter: Payer: Self-pay | Admitting: Gastroenterology

## 2017-03-18 ENCOUNTER — Ambulatory Visit (INDEPENDENT_AMBULATORY_CARE_PROVIDER_SITE_OTHER): Payer: Medicaid Other | Admitting: Gastroenterology

## 2017-03-18 VITALS — BP 109/77 | HR 70 | Temp 97.9°F | Ht 65.5 in | Wt 190.4 lb

## 2017-03-18 DIAGNOSIS — K219 Gastro-esophageal reflux disease without esophagitis: Secondary | ICD-10-CM

## 2017-03-18 DIAGNOSIS — K2970 Gastritis, unspecified, without bleeding: Secondary | ICD-10-CM | POA: Diagnosis not present

## 2017-03-18 DIAGNOSIS — K580 Irritable bowel syndrome with diarrhea: Secondary | ICD-10-CM | POA: Diagnosis not present

## 2017-03-18 DIAGNOSIS — K2990 Gastroduodenitis, unspecified, without bleeding: Secondary | ICD-10-CM

## 2017-03-18 MED ORDER — ESOMEPRAZOLE MAGNESIUM 40 MG PO CPDR: 40.0000 mg | DELAYED_RELEASE_CAPSULE | Freq: Every day | ORAL | 11 refills | Status: DC

## 2017-03-18 NOTE — Assessment & Plan Note (Signed)
More frequent heartburn, difficult to manage with diet. Seems to be worse with stress. Gastritis noted on EGD back in April. Begin Nexium 40 mg 30 mins before breakfast daily. Return to the office in one year. Call sooner if needed.

## 2017-03-18 NOTE — Patient Instructions (Addendum)
1. Prescription for Nexium sent to your pharmacy. Take once daily 30 minutes before breakfast. 2. She continued to have abdominal discomfort, please call and we will try an antispasmodic such as Bentyl. 3. See back in one year or sooner if needed.

## 2017-03-18 NOTE — Progress Notes (Signed)
cc'ed to pcp °

## 2017-03-18 NOTE — Assessment & Plan Note (Signed)
Suspected IBS. Clinically stable at this time although she has diffuse chronic abdominal pain with light palpation. If she continues to have abdominal pain after resuming Nexium, she can call and we can try Bentyl. Otherwise see her back in one year.

## 2017-03-18 NOTE — Progress Notes (Signed)
Primary Care Physician: Raylene Everts, MD  Primary Gastroenterologist:  Barney Drain, MD   Chief Complaint  Patient presents with  . Diarrhea  . Abdominal Pain  . Gastroesophageal Reflux    HPI: Brooke Eaton is a 41 y.o. female here For follow-up of recent EGD colonoscopy. Last seen in March. History of GERD, chronic abdominal pain, change in bowel habits.  In the past she has been on various PPIs, Prevacid and Nexium both helped however Prevacid caused diarrhea. Omeprazole did not help her symptoms. When I saw her at her office visit she was adamant about not trying another pill. SHE DIDN'T LIKE DR. PATEL BECAUSE "HE KEPT PUSHING PILLS ON ME". She wanted to know what was wrong. In the past she has been able to reasonably control her heartburn with her diet.  Recent EGD and colonoscopy April 2018. Non-obstructing Schatzki ring was dilated. She had mild gastritis with no H pylori. Small bowel biopsies negative for celiac. Large duodenal diverticulum involving the ampulla noted. On colonoscopy she had a normal terminal ileum, random colon biopsies were negative for microscopic colitis. She had multiple small and large mouth diverticula in the entire colon. Internal hemorrhoids.   She notes that stress seems cause her a lot of heartburn. She would like to go back on Nexium at this point. Overall her bowel movements been much better, less frequent. Seems to depend on what she eats. Denies blood in the stool or melena. Consume smoothies with vegetables every morning. Chronically her abdomen has been diffusely source that she can remember as even as a teenager. Denies fever. Denies worsening of her symptoms. Dysphagia resolved status post dilation.   Current Outpatient Prescriptions  Medication Sig Dispense Refill  . albuterol (PROVENTIL HFA;VENTOLIN HFA) 108 (90 Base) MCG/ACT inhaler Inhale 2 puffs into the lungs every 6 (six) hours as needed for wheezing or shortness of breath.    .  diphenhydrAMINE (BENADRYL) 25 MG tablet Take 50 mg by mouth every 6 (six) hours as needed for allergies.     No current facility-administered medications for this visit.     Allergies as of 03/18/2017 - Review Complete 03/18/2017  Allergen Reaction Noted  . Doxycycline Nausea And Vomiting 10/27/2011  . Flagyl [metronidazole] Itching 01/17/2012  . Latex Itching 10/27/2011  . Penicillins Itching 10/27/2011    ROS:  General: Negative for anorexia, weight loss, fever, chills, fatigue, weakness. ENT: Negative for hoarseness, difficulty swallowing , nasal congestion. CV: Negative for chest pain, angina, palpitations, dyspnea on exertion, peripheral edema.  Respiratory: Negative for dyspnea at rest, dyspnea on exertion, cough, sputum, wheezing.  GI: See history of present illness. GU:  Negative for dysuria, hematuria, urinary incontinence, urinary frequency, nocturnal urination.  Endo: Negative for unusual weight change.    Physical Examination:   BP 109/77   Pulse 70   Temp 97.9 F (36.6 C) (Oral)   Ht 5' 5.5" (1.664 m)   Wt 190 lb 6.4 oz (86.4 kg)   LMP 03/16/2017 (Approximate)   BMI 31.20 kg/m   General: Well-nourished, well-developed in no acute distress.  Eyes: No icterus. Mouth: Oropharyngeal mucosa moist and pink , no lesions erythema or exudate. Lungs: Clear to auscultation bilaterally.  Heart: Regular rate and rhythm, no murmurs rubs or gallops.  Abdomen: Bowel sounds are normal,nondistended, no hepatosplenomegaly or masses, no abdominal bruits or hernia , no rebound or guarding. Mild diffuse tenderness to light palpation.  Extremities: No lower extremity edema. No clubbing or deformities. Neuro:  Alert and oriented x 4   Skin: Warm and dry, no jaundice.   Psych: Alert and cooperative, normal mood and affect.

## 2017-04-23 ENCOUNTER — Telehealth: Payer: Self-pay | Admitting: Family Medicine

## 2017-04-23 NOTE — Telephone Encounter (Signed)
Patient is requesting a referral to orthopedic, states she has experienced low back pain since her epidural 2 years ago, and her right knee is swelling. Cb#: [phone]

## 2017-04-24 NOTE — Telephone Encounter (Signed)
Needs appointment

## 2017-04-25 ENCOUNTER — Ambulatory Visit (INDEPENDENT_AMBULATORY_CARE_PROVIDER_SITE_OTHER): Payer: Medicaid Other | Admitting: Family Medicine

## 2017-04-25 ENCOUNTER — Encounter: Payer: Self-pay | Admitting: Family Medicine

## 2017-04-25 VITALS — BP 108/78 | HR 78 | Temp 98.1°F | Resp 16 | Ht 65.5 in | Wt 193.0 lb

## 2017-04-25 DIAGNOSIS — Z23 Encounter for immunization: Secondary | ICD-10-CM | POA: Diagnosis not present

## 2017-04-25 DIAGNOSIS — M545 Low back pain, unspecified: Secondary | ICD-10-CM

## 2017-04-25 DIAGNOSIS — M25561 Pain in right knee: Secondary | ICD-10-CM

## 2017-04-25 DIAGNOSIS — F329 Major depressive disorder, single episode, unspecified: Secondary | ICD-10-CM | POA: Diagnosis not present

## 2017-04-25 DIAGNOSIS — G8929 Other chronic pain: Secondary | ICD-10-CM

## 2017-04-25 DIAGNOSIS — F419 Anxiety disorder, unspecified: Secondary | ICD-10-CM | POA: Diagnosis not present

## 2017-04-25 DIAGNOSIS — M25562 Pain in left knee: Secondary | ICD-10-CM

## 2017-04-25 DIAGNOSIS — F32A Depression, unspecified: Secondary | ICD-10-CM

## 2017-04-25 MED ORDER — CYCLOBENZAPRINE HCL 5 MG PO TABS: 5.0000 mg | ORAL_TABLET | Freq: Three times a day (TID) | ORAL | 1 refills | Status: DC | PRN

## 2017-04-25 MED ORDER — MELOXICAM 15 MG PO TABS: 15.0000 mg | ORAL_TABLET | Freq: Every day | ORAL | 1 refills | Status: DC

## 2017-04-25 MED ORDER — MELOXICAM 15 MG PO TABS
15.0000 mg | ORAL_TABLET | Freq: Every day | ORAL | 1 refills | Status: DC
Start: 2017-04-25 — End: 2017-06-17

## 2017-04-25 NOTE — Patient Instructions (Addendum)
Need x rays of back and knee Take the meloxicam once a day Stop the aleve Take the cyclobenzaprine as needed back spasms  See me in 2 weeks

## 2017-04-25 NOTE — Progress Notes (Signed)
Chief Complaint  Patient presents with  . Knee Pain    right  Brooke Eaton called and asked for an orthopedic referral for chronic back pain.  Brooke Eaton states her back has hurt ever since her pregnancy 2 y ago  Brooke Eaton blames the epidural injection - but I informed her this is unlikely. Central low back pain, most days, worse some days than others.  No specific triggers.  No radiation of pain into the legs. No numbness or weakness.  Also complains of both knees hurting R>L.  Also chronic. Hurts to squat.  No grinding or buckling.  Sometimes R knee looks swollen, sometimes it is warm.  Brooke Eaton takes aleve prn with temporary relief.  While here Brooke Eaton requests a PAP.  Last one unknown. Brooke Eaton requests referral for anxiety and sleep Brooke Eaton is told to reschedule these complaints.  Patient Active Problem List   Diagnosis Date Noted  . Gastritis and gastroduodenitis 03/18/2017  . Abdominal pain, epigastric 10/22/2016  . Esophageal dysphagia 10/22/2016  . Diarrhea 10/22/2016  . Asthma 09/02/2016  . Marijuana use, continuous 09/02/2016  . Menorrhagia with regular cycle 08/16/2016  . Dysmenorrhea 01/17/2012  . PMS (premenstrual syndrome) 01/17/2012  . Anxiety and depression   . Post-traumatic stress disorder   . Ruptured cyst of ovary   . Fibroid   . Irritable bowel syndrome   . GERD (gastroesophageal reflux disease)     Outpatient Encounter Prescriptions as of 04/25/2017  Medication Sig  . albuterol (PROVENTIL HFA;VENTOLIN HFA) 108 (90 Base) MCG/ACT inhaler Inhale 2 puffs into the lungs every 6 (six) hours as needed for wheezing or shortness of breath.  . diphenhydrAMINE (BENADRYL) 25 MG tablet Take 50 mg by mouth every 6 (six) hours as needed for allergies.  Marland Kitchen esomeprazole (NEXIUM) 40 MG capsule Take 1 capsule (40 mg total) by mouth daily before breakfast.  . cyclobenzaprine (FLEXERIL) 5 MG tablet Take 1 tablet (5 mg total) by mouth 3 (three) times daily as needed for muscle spasms.  . meloxicam (MOBIC) 15 MG  tablet Take 1 tablet (15 mg total) by mouth daily.   No facility-administered encounter medications on file as of 04/25/2017.     Allergies  Allergen Reactions  . Doxycycline Nausea And Vomiting    Cannot tolerate oral doxycycline   . Flagyl [Metronidazole] Itching  . Latex Itching  . Penicillins Itching    Has patient had a PCN reaction causing immediate rash, facial/tongue/throat swelling, SOB or lightheadedness with hypotension: NO Has patient had a PCN reaction causing severe rash involving mucus membranes or skin necrosis: No Has patient had a PCN reaction that required hospitalization No Has patient had a PCN reaction occurring within the last 10 years: No If all of the above answers are "NO", then may proceed with Cephalosporin use.     Review of Systems  Constitutional: Negative for activity change, appetite change and unexpected weight change.  HENT: Negative for congestion, dental problem, postnasal drip and rhinorrhea.   Eyes: Negative for redness and visual disturbance.  Respiratory: Negative for cough and shortness of breath.   Cardiovascular: Negative for chest pain, palpitations and leg swelling.  Gastrointestinal: Negative for abdominal pain, constipation and diarrhea.  Genitourinary: Negative for difficulty urinating, frequency and menstrual problem.  Musculoskeletal: Positive for arthralgias, back pain and joint swelling.  Neurological: Negative for dizziness and headaches.  Psychiatric/Behavioral: Positive for sleep disturbance. Negative for dysphoric mood. The patient is nervous/anxious.     BP 108/78 (BP Location: Right Arm, Patient Position:  Sitting, Cuff Size: Normal)   Pulse 78   Temp 98.1 F (36.7 C) (Temporal)   Resp 16   Ht 5' 5.5" (1.664 m)   Wt 193 lb 0.6 oz (87.6 kg)   SpO2 100%   BMI 31.63 kg/m   Physical Exam  Constitutional: Brooke Eaton appears well-developed and well-nourished. No distress.  HENT:  Head: Normocephalic and atraumatic.    Mouth/Throat: Oropharynx is clear and moist.  Neck: Normal range of motion. Neck supple.  Cardiovascular: Normal rate, regular rhythm and normal heart sounds.   Pulmonary/Chest: Effort normal and breath sounds normal.  Musculoskeletal: Normal range of motion. Brooke Eaton exhibits no edema.  Lumbar spine is straight and symmetric. Full range of motion. No tenderness or muscle spasm. Strength, sensation, range of motion, and reflexes are normal in both lower extremities. Straight leg raise is negative bilateral. Can easily touch toes. Right knee with no warmth or effusion,  Mild tender med joint line.   No crepitus  Neurological: Brooke Eaton is alert. Brooke Eaton displays normal reflexes. Coordination normal.  Psychiatric: Brooke Eaton has a normal mood and affect. Her behavior is normal.    ASSESSMENT/PLAN:  1. Need for influenza vaccination - Flu Vaccine QUAD 36+ mos IM  2. Chronic midline low back pain without sciatica mechanical - DG Lumbar Spine Complete; Future  3. Acute pain of both knees Right more than left - DG Knee Complete 4 Views Right; Future   Patient Instructions  Need x rays of back and knee Take the meloxicam once a day Stop the aleve Take the cyclobenzaprine as needed back spasms  See me in 2 weeks   Raylene Everts, MD

## 2017-05-02 ENCOUNTER — Telehealth (HOSPITAL_COMMUNITY): Payer: Self-pay | Admitting: *Deleted

## 2017-05-02 ENCOUNTER — Telehealth: Payer: Self-pay | Admitting: Family Medicine

## 2017-05-02 ENCOUNTER — Ambulatory Visit (HOSPITAL_COMMUNITY): Payer: Medicaid Other | Admitting: Licensed Clinical Social Worker

## 2017-05-02 NOTE — Telephone Encounter (Signed)
Does not need a referral.  Can go to [facility].

## 2017-05-02 NOTE — Telephone Encounter (Signed)
Patient went to [facility] this morning because they gave her an appointment last minute yesterday evening.  She got to their office 8:55.  She states that the receptionist was rude and told her that she would have to be rushed since she did not arrive early for her appointment. She declined the appt this morning because she did not want the therapist to be rushed. She spoke directly with him.  Patient stated she could have been there early, but was not told to do so.  Now she is not  able to return for an appointment until mid or late Oct.  She is her in office now requesting that we refer her to another location.    cb  [phone]

## 2017-05-02 NOTE — Telephone Encounter (Signed)
patient arrived today for new patient appointment at 9:00.  She was scheduled to see the provider at 9:00, therefore, we need to quickly get her through registration so she could see the provider.   The patient immediately said she was not going to be rushed and asked to speak with a Freight forwarder.   patient refused to complete registration, and left without being seen.

## 2017-05-02 NOTE — Telephone Encounter (Signed)
Tried to call number listed below, got the message it is out of service. Called other number listed in chart, left message to return call .

## 2017-05-14 ENCOUNTER — Ambulatory Visit (INDEPENDENT_AMBULATORY_CARE_PROVIDER_SITE_OTHER): Payer: Medicaid Other | Admitting: Obstetrics and Gynecology

## 2017-05-14 ENCOUNTER — Telehealth (HOSPITAL_COMMUNITY): Payer: Self-pay

## 2017-05-14 ENCOUNTER — Encounter: Payer: Self-pay | Admitting: Obstetrics and Gynecology

## 2017-05-14 VITALS — BP 140/80 | HR 85 | Ht 65.0 in | Wt 192.4 lb

## 2017-05-14 DIAGNOSIS — A599 Trichomoniasis, unspecified: Secondary | ICD-10-CM

## 2017-05-14 DIAGNOSIS — R350 Frequency of micturition: Secondary | ICD-10-CM

## 2017-05-14 DIAGNOSIS — N898 Other specified noninflammatory disorders of vagina: Secondary | ICD-10-CM | POA: Diagnosis not present

## 2017-05-14 LAB — POCT URINALYSIS DIPSTICK
GLUCOSE UA: NEGATIVE
Ketones, UA: NEGATIVE
NITRITE UA: NEGATIVE
PROTEIN UA: NEGATIVE

## 2017-05-14 LAB — POCT WET PREP WITH KOH
CLUE CELLS WET PREP PER HPF POC: NEGATIVE
KOH PREP POC: NEGATIVE
Trichomonas, UA: POSITIVE
WBC Wet Prep HPF POC: POSITIVE

## 2017-05-14 NOTE — Telephone Encounter (Signed)
Metronidazole 500mg  take 4 tabs (2gms) now and partner is to take 4 tabs now.

## 2017-05-14 NOTE — Addendum Note (Signed)
Addended by: Linton Rump on: 05/14/2017 02:49 PM   Modules accepted: Orders

## 2017-05-14 NOTE — Progress Notes (Signed)
[facility] Visit  05/14/2017            Patient name: Brooke Eaton MRN [id number]  Date of birth: 17-Sep-1975  CC & HPI:  Brooke Eaton is a 41 y.o. female presenting today for vaginal irritation with onset of about 4 days ago. Pt states she had her period at the beginning of the month, and she felt "funny" when it was ending. Pt has associated symptoms of itching and vaginal discharge. No alleviating factor noted. Pt has not tried any medications for relief. Pt has been with her partner for 6 months.  Pt has another cyst that she says flares up during and before her period.  ROS:  ROS  +vaginal irritation +vaginal discharge -fever All systems are negative except as noted in the HPI and PMH.     Pertinent History Reviewed:   Reviewed: Significant for IBS, fibroid, Medical         Past Medical History:  Diagnosis Date  . Acid reflux   . Allergy   . Anxiety   . Anxiety and depression   . Asthma   . Depression   . Fibroid   . Heart murmur   . Irritable bowel syndrome   . Migraines   . PONV (postoperative nausea and vomiting)   . Post-traumatic stress disorder   . PTSD (post-traumatic stress disorder)   . Ruptured cyst of ovary 2001  . Substance abuse [facility])                               Surgical Hx:    Past Surgical History:  Procedure Laterality Date  . ADENOIDECTOMY    . COLONOSCOPY N/A 11/07/2016   Procedure: COLONOSCOPY;  Surgeon: Danie Binder, MD;  [facility];  Service: Endoscopy;  Laterality: N/A;  1:30pm-rescheduled to 4/5 at 10:30am per Ginger  . DILATION AND CURETTAGE OF UTERUS    . DILITATION & CURRETTAGE/HYSTROSCOPY WITH NOVASURE ABLATION N/A 10/01/2016   Procedure: DILATATION & CURETTAGE/HYSTEROSCOPY WITH NOVASURE ABLATION AND EXCISION OF VULVAR/GLUTEAL  CYST;  Surgeon: Jonnie Kind, MD;  Location: [facility];  Service: Gynecology;  Laterality: N/A;  . ESOPHAGOGASTRODUODENOSCOPY N/A 11/07/2016   Procedure: ESOPHAGOGASTRODUODENOSCOPY (EGD);   Surgeon: Danie Binder, MD;  [facility];  Service: Endoscopy;  Laterality: N/A;  . SAVORY DILATION N/A 11/07/2016   Procedure: SAVORY DILATION;  Surgeon: Danie Binder, MD;  [facility];  Service: Endoscopy;  Laterality: N/A;  . TONSILLECTOMY    . TUBAL LIGATION  2016  . WISDOM TOOTH EXTRACTION     Medications: Reviewed & Updated - see associated section                       Current Outpatient Prescriptions:  .  albuterol (PROVENTIL HFA;VENTOLIN HFA) 108 (90 Base) MCG/ACT inhaler, Inhale 2 puffs into the lungs every 6 (six) hours as needed for wheezing or shortness of breath., Disp: , Rfl:  .  cyclobenzaprine (FLEXERIL) 5 MG tablet, Take 1 tablet (5 mg total) by mouth 3 (three) times daily as needed for muscle spasms., Disp: 30 tablet, Rfl: 1 .  diphenhydrAMINE (BENADRYL) 25 MG tablet, Take 50 mg by mouth every 6 (six) hours as needed for allergies., Disp: , Rfl:  .  esomeprazole (NEXIUM) 40 MG capsule, Take 1 capsule (40 mg total) by mouth daily before breakfast. (Patient taking differently: Take 40 mg  by mouth as needed. ), Disp: 30 capsule, Rfl: 11 .  meloxicam (MOBIC) 15 MG tablet, Take 1 tablet (15 mg total) by mouth daily., Disp: 30 tablet, Rfl: 1   Social History: Reviewed -  reports that she has never smoked. She has never used smokeless tobacco.  Objective Findings:  Vitals: Blood pressure 140/80, pulse 85, height 5\' 5"  (1.651 m), weight 192 lb 6.4 oz (87.3 kg), last menstrual period 05/06/2017.  Physical Examination: General appearance - alert, well appearing, and in no distress Mental status - alert, oriented to person, place, and time Pelvic -  VULVA: normal appearing vulva with no masses, tenderness or lesions, whitish discharge, tenderness Redness and  Irritation near yellowish-white discharge Left labial majora folliculitis that seems to be resolving VAGINA: normal appearing vagina with normal color and discharge, no lesions, increased  discharge CERVIX: normal appearing cervix without discharge or lesions,  UTERUS: uterus is normal size, shape, consistency and nontender, anterior ADNEXA: normal adnexa in size, nontender and no masses  KOH and wet prep done Trichomoniasis present Wet prep few wbc, + trich,  Koh : neg for whiff and neg for yeast.  Assessment & Plan:   A:  1. Trichomoniasis  P:  1. Prescribe Metronidazole , 2 gm, 8 tablets for pt and partner,  2. F/u for proof of cure in 3 weeks, and check Gc/Chl     By signing my name below, I, Izna Ahmed, attest that this documentation has been prepared under the direction and in the presence of Jonnie Kind, MD. Electronically Signed: Jabier Gauss, Medical Scribe. 05/14/17. 2:01 PM.  I personally performed the services described in this documentation, which was SCRIBED in my presence. The recorded information has been reviewed and considered accurate. It has been edited as necessary during review. Jonnie Kind, MD

## 2017-05-16 ENCOUNTER — Telehealth: Payer: Self-pay | Admitting: *Deleted

## 2017-05-16 ENCOUNTER — Other Ambulatory Visit: Payer: Medicaid Other

## 2017-05-16 DIAGNOSIS — Z113 Encounter for screening for infections with a predominantly sexual mode of transmission: Secondary | ICD-10-CM

## 2017-05-16 LAB — GC/CHLAMYDIA PROBE AMP
CHLAMYDIA, DNA PROBE: NEGATIVE
NEISSERIA GONORRHOEAE BY PCR: NEGATIVE

## 2017-05-16 LAB — URINE CULTURE

## 2017-05-16 NOTE — Telephone Encounter (Signed)
Spoke with pt letting her know she don't have a UTI. Pt saw results on My Chart and thought she had a UTI. Also, pt wants STD labs drawn, HIV and RPR. Orders placed and pt plans on going this afternoon. Pt voiced understanding. [place]

## 2017-05-18 LAB — RPR: RPR: NONREACTIVE

## 2017-05-18 LAB — HIV ANTIBODY (ROUTINE TESTING W REFLEX): HIV SCREEN 4TH GENERATION: NONREACTIVE

## 2017-05-21 ENCOUNTER — Encounter (INDEPENDENT_AMBULATORY_CARE_PROVIDER_SITE_OTHER): Payer: Medicaid Other | Admitting: Family Medicine

## 2017-06-04 ENCOUNTER — Encounter: Payer: Self-pay | Admitting: Obstetrics and Gynecology

## 2017-06-04 ENCOUNTER — Ambulatory Visit (INDEPENDENT_AMBULATORY_CARE_PROVIDER_SITE_OTHER): Payer: Medicaid Other | Admitting: Obstetrics and Gynecology

## 2017-06-04 VITALS — BP 110/72 | HR 71 | Ht 65.5 in | Wt 193.4 lb

## 2017-06-04 DIAGNOSIS — Z09 Encounter for follow-up examination after completed treatment for conditions other than malignant neoplasm: Secondary | ICD-10-CM | POA: Diagnosis not present

## 2017-06-04 DIAGNOSIS — Z113 Encounter for screening for infections with a predominantly sexual mode of transmission: Secondary | ICD-10-CM

## 2017-06-04 DIAGNOSIS — Z8619 Personal history of other infectious and parasitic diseases: Secondary | ICD-10-CM

## 2017-06-04 LAB — POCT WET PREP (WET MOUNT): TRICHOMONAS WET PREP HPF POC: ABSENT

## 2017-06-04 NOTE — Progress Notes (Signed)
[facility] Visit  06/04/2017            Patient name: Brooke Eaton MRN [id number]  Date of birth: [birth year]  CC & HPI:  Brooke Eaton is a 41 y.o. female presenting today for GC/CHL and proof of cure of trichomoniasis after being prescribed metronidazole on 05/14/17. She has no complaints today. No alleviating factors noted. Pt was seen in office on 05/14/17 for vaginal irritation and abnormal discharge lasting 4 days.   ROS:  ROS -fever -chills All systems are negative except as noted in the HPI and PMH.    Pertinent History Reviewed:   Reviewed: Significant for IBS, ruptured ovarian cyst Medical         Past Medical History:  Diagnosis Date  . Acid reflux   . Allergy   . Anxiety   . Anxiety and depression   . Asthma   . Depression   . Fibroid   . Heart murmur   . Irritable bowel syndrome   . Migraines   . PONV (postoperative nausea and vomiting)   . Post-traumatic stress disorder   . PTSD (post-traumatic stress disorder)   . Ruptured cyst of ovary 2001  . Substance abuse [facility])                               Surgical Hx:    Past Surgical History:  Procedure Laterality Date  . ADENOIDECTOMY    . COLONOSCOPY N/A 11/07/2016   Procedure: COLONOSCOPY;  Surgeon: Danie Binder, MD;  [facility];  Service: Endoscopy;  Laterality: N/A;  1:30pm-rescheduled to 4/5 at 10:30am per Ginger  . DILATION AND CURETTAGE OF UTERUS    . DILITATION & CURRETTAGE/HYSTROSCOPY WITH NOVASURE ABLATION N/A 10/01/2016   Procedure: DILATATION & CURETTAGE/HYSTEROSCOPY WITH NOVASURE ABLATION AND EXCISION OF VULVAR/GLUTEAL  CYST;  Surgeon: Jonnie Kind, MD;  Location: [facility];  Service: Gynecology;  Laterality: N/A;  . ESOPHAGOGASTRODUODENOSCOPY N/A 11/07/2016   Procedure: ESOPHAGOGASTRODUODENOSCOPY (EGD);  Surgeon: Danie Binder, MD;  [facility];  Service: Endoscopy;  Laterality: N/A;  . SAVORY DILATION N/A 11/07/2016   Procedure: SAVORY DILATION;  Surgeon: Danie Binder, MD;  [facility];  Service: Endoscopy;  Laterality: N/A;  . TONSILLECTOMY    . TUBAL LIGATION  2016  . WISDOM TOOTH EXTRACTION     Medications: Reviewed & Updated - see associated section                       Current Outpatient Prescriptions:  .  albuterol (PROVENTIL HFA;VENTOLIN HFA) 108 (90 Base) MCG/ACT inhaler, Inhale 2 puffs into the lungs every 6 (six) hours as needed for wheezing or shortness of breath., Disp: , Rfl:  .  cyclobenzaprine (FLEXERIL) 5 MG tablet, Take 1 tablet (5 mg total) by mouth 3 (three) times daily as needed for muscle spasms., Disp: 30 tablet, Rfl: 1 .  diphenhydrAMINE (BENADRYL) 25 MG tablet, Take 50 mg by mouth every 6 (six) hours as needed for allergies., Disp: , Rfl:  .  esomeprazole (NEXIUM) 40 MG capsule, Take 1 capsule (40 mg total) by mouth daily before breakfast. (Patient taking differently: Take 40 mg by mouth as needed. ), Disp: 30 capsule, Rfl: 11 .  meloxicam (MOBIC) 15 MG tablet, Take 1 tablet (15 mg total) by mouth daily., Disp: 30 tablet, Rfl: 1   Social History: Reviewed -  reports that she has never smoked. She has never used smokeless tobacco.  Objective Findings:  Vitals: Blood pressure 110/72, pulse 71, height 5' 5.5" (1.664 m), weight 193 lb 6.4 oz (87.7 kg), last menstrual period 05/06/2017.  Physical Examination: General appearance - alert, well appearing, and in no distress Mental status - alert, oriented to person, place, and time Pelvic -  VULVA: normal appearing vulva with no masses, tenderness or lesions, white creamy discharge VAGINA: normal appearing vagina with normal color and discharge, no lesions,  CERVIX: normal appearing cervix without discharge or lesions, healthy UTERUS: not done,  ADNEXA: not done  GC/Cl taken Wet Prep negative KOH negative for whiff. yeast  Assessment & Plan:   A:  1. Trichomoniasis cured 2. GC/Chl done  P:  1. Follow up PRN    By signing my name below, I, Izna Ahmed,  attest that this documentation has been prepared under the direction and in the presence of Jonnie Kind, MD. Electronically Signed: Jabier Gauss, Medical Scribe. 06/04/17. 2:48 PM.  I personally performed the services described in this documentation, which was SCRIBED in my presence. The recorded information has been reviewed and considered accurate. It has been edited as necessary during review. Jonnie Kind, MD

## 2017-06-06 LAB — GC/CHLAMYDIA PROBE AMP
CHLAMYDIA, DNA PROBE: NEGATIVE
NEISSERIA GONORRHOEAE BY PCR: NEGATIVE

## 2017-06-09 ENCOUNTER — Ambulatory Visit (INDEPENDENT_AMBULATORY_CARE_PROVIDER_SITE_OTHER): Payer: Medicaid Other | Admitting: Family Medicine

## 2017-06-09 ENCOUNTER — Encounter: Payer: Self-pay | Admitting: Family Medicine

## 2017-06-09 VITALS — BP 112/68 | HR 84 | Temp 98.3°F | Resp 16 | Ht 66.0 in | Wt 194.0 lb

## 2017-06-09 DIAGNOSIS — Z0000 Encounter for general adult medical examination without abnormal findings: Secondary | ICD-10-CM | POA: Diagnosis not present

## 2017-06-09 DIAGNOSIS — R1013 Epigastric pain: Secondary | ICD-10-CM

## 2017-06-09 DIAGNOSIS — J4520 Mild intermittent asthma, uncomplicated: Secondary | ICD-10-CM | POA: Diagnosis not present

## 2017-06-09 MED ORDER — PREDNISONE 10 MG PO TABS: 10.0000 mg | ORAL_TABLET | Freq: Every day | ORAL | 0 refills | Status: DC

## 2017-06-09 NOTE — Patient Instructions (Signed)
See me in six months 

## 2017-06-09 NOTE — Progress Notes (Signed)
Chief Complaint  Patient presents with  . Annual Exam   Is here for an annual exam. She is up-to-date with her health testing and screening. She declines mammogram. Immunizations are up-to-date. She gets her Pap smears through the GYN office. She sees gastroenterology for her GERD. She does have a history of posttraumatic stress disorder anxiety and depression.  She states she is stable right now. She does have a history of asthma and has had more wheezing with a change in the weather.  She requests a 5-day supply of prednisone.  Patient Active Problem List   Diagnosis Date Noted  . Gastritis and gastroduodenitis 03/18/2017  . Abdominal pain, epigastric 10/22/2016  . Esophageal dysphagia 10/22/2016  . Asthma 09/02/2016  . Marijuana use, continuous 09/02/2016  . Menorrhagia with regular cycle 08/16/2016  . Dysmenorrhea 01/17/2012  . PMS (premenstrual syndrome) 01/17/2012  . Anxiety and depression   . Post-traumatic stress disorder   . Irritable bowel syndrome   . GERD (gastroesophageal reflux disease)     Outpatient Encounter Medications as of 06/09/2017  Medication Sig  . albuterol (PROVENTIL HFA;VENTOLIN HFA) 108 (90 Base) MCG/ACT inhaler Inhale 2 puffs into the lungs every 6 (six) hours as needed for wheezing or shortness of breath.  . cyclobenzaprine (FLEXERIL) 5 MG tablet Take 1 tablet (5 mg total) by mouth 3 (three) times daily as needed for muscle spasms.  . diphenhydrAMINE (BENADRYL) 25 MG tablet Take 50 mg by mouth every 6 (six) hours as needed for allergies.  Marland Kitchen esomeprazole (NEXIUM) 40 MG capsule Take 1 capsule (40 mg total) by mouth daily before breakfast. (Patient taking differently: Take 40 mg by mouth as needed. )  . meloxicam (MOBIC) 15 MG tablet Take 1 tablet (15 mg total) by mouth daily.  . predniSONE (DELTASONE) 10 MG tablet Take 1 tablet (10 mg total) daily with breakfast by mouth.   No facility-administered encounter medications on file as of 06/09/2017.       Allergies  Allergen Reactions  . Doxycycline Nausea And Vomiting    Cannot tolerate oral doxycycline   . Flagyl [Metronidazole] Itching  . Latex Itching  . Penicillins Itching    Has patient had a PCN reaction causing immediate rash, facial/tongue/throat swelling, SOB or lightheadedness with hypotension: NO Has patient had a PCN reaction causing severe rash involving mucus membranes or skin necrosis: No Has patient had a PCN reaction that required hospitalization No Has patient had a PCN reaction occurring within the last 10 years: No If all of the above answers are "NO", then may proceed with Cephalosporin use.     Review of Systems  Constitutional: Negative for activity change, appetite change and unexpected weight change.  HENT: Negative for congestion, dental problem, postnasal drip and rhinorrhea.   Eyes: Negative for redness and visual disturbance.  Respiratory: Negative for cough and shortness of breath.   Cardiovascular: Negative for chest pain, palpitations and leg swelling.  Gastrointestinal: Positive for abdominal pain. Negative for constipation and diarrhea.       Occasional epigastric  Genitourinary: Positive for menstrual problem. Negative for difficulty urinating and frequency.  Musculoskeletal: Positive for back pain. Negative for arthralgias.       Rare  Neurological: Negative for dizziness and headaches.  Psychiatric/Behavioral: Negative for dysphoric mood and sleep disturbance. The patient is not nervous/anxious.     BP 112/68 (BP Location: Right Arm, Patient Position: Sitting, Cuff Size: Normal)   Pulse 84   Temp 98.3 F (36.8 C) (Temporal)  Resp 16   Ht 5\' 6"  (1.676 m)   Wt 194 lb 0.6 oz (88 kg)   SpO2 100%   BMI 31.32 kg/m   Physical Exam  BP 112/68 (BP Location: Right Arm, Patient Position: Sitting, Cuff Size: Normal)   Pulse 84   Temp 98.3 F (36.8 C) (Temporal)   Resp 16   Ht 5\' 6"  (1.676 m)   Wt 194 lb 0.6 oz (88 kg)   SpO2 100%    BMI 31.32 kg/m   General Appearance:    Alert, cooperative, no distress, appears stated age  Head:    Normocephalic, without obvious abnormality, atraumatic  Eyes:    PERRL, conjunctiva/corneas clear, EOM's intact, fundi    benign, both eyes  Ears:    Normal TM's and external ear canals, both ears  Nose:   Nares normal, septum midline, mucosa normal, no drainage    or sinus tenderness  Throat:   Lips, mucosa, and tongue normal; teeth and gums normal  Neck:   Supple, symmetrical, trachea midline, no adenopathy;    thyroid:  no enlargement/tenderness/nodules;   Back:     Symmetric, no curvature, ROM normal, no CVA tenderness  Lungs:     Clear to auscultation bilaterally, respirations unlabored  Chest Wall:   Mild tenderness over xiphoid process to palpation.  No deformity   Heart:    Regular rate and rhythm, S1 and S2 normal, no murmur, rub   or gallop  Breast Exam:    No tenderness, masses, or nipple abnormality  Abdomen:     Soft, non-tender, bowel sounds active all four quadrants,    no masses, no organomegaly  Extremities:   Extremities normal, atraumatic, no cyanosis or edema  Pulses:   2+ and symmetric all extremities  Skin:   Skin color, texture, turgor normal, no rashes or lesions.multiple tattoos  Lymph nodes:   Cervical, supraclavicular, and axillary nodes normal  Neurologic:   Normal strength, sensation and reflexes    throughout     ASSESSMENT/PLAN:  1. Abdominal pain, epigastric Chronic abdominal pain from GERD.  Current tenderness is over the xiphoid and is likely a mild costochondritis.  2. Annual physical exam With no significant abnormal findings 3.  Asthma We will refill prednisone for as needed use.  Instructions on when to use it are given.  Patient Instructions  See me in six months   Raylene Everts, MD

## 2017-06-17 ENCOUNTER — Other Ambulatory Visit: Payer: Self-pay | Admitting: Podiatry

## 2017-06-17 ENCOUNTER — Encounter: Payer: Self-pay | Admitting: Podiatry

## 2017-06-17 ENCOUNTER — Ambulatory Visit (INDEPENDENT_AMBULATORY_CARE_PROVIDER_SITE_OTHER): Payer: Medicaid Other | Admitting: Podiatry

## 2017-06-17 ENCOUNTER — Ambulatory Visit (INDEPENDENT_AMBULATORY_CARE_PROVIDER_SITE_OTHER): Payer: Medicaid Other

## 2017-06-17 VITALS — BP 106/69 | HR 86

## 2017-06-17 DIAGNOSIS — M7750 Other enthesopathy of unspecified foot: Secondary | ICD-10-CM | POA: Diagnosis not present

## 2017-06-17 DIAGNOSIS — M79671 Pain in right foot: Secondary | ICD-10-CM

## 2017-06-17 DIAGNOSIS — M779 Enthesopathy, unspecified: Secondary | ICD-10-CM

## 2017-06-17 DIAGNOSIS — M7671 Peroneal tendinitis, right leg: Secondary | ICD-10-CM | POA: Diagnosis not present

## 2017-06-17 DIAGNOSIS — M79672 Pain in left foot: Principal | ICD-10-CM

## 2017-06-17 MED ORDER — MELOXICAM 15 MG PO TABS
15.0000 mg | ORAL_TABLET | Freq: Every day | ORAL | 1 refills | Status: DC
Start: 2017-06-17 — End: 2017-08-07

## 2017-06-19 NOTE — Progress Notes (Signed)
   HPI: 41 year old female presenting today as a new patient with a complaint of throbbing, aching pain to the dorsal aspect of bilateral feet that has been ongoing for the past two years. She reports associated swelling in the ankles. Applying pressure to the feet with ambulation and standing increases the pain. Standing after sitting for a long period of time also exacerbates the symptoms. There are no alleviating factors noted. She has not done anything to treat the symptoms. She is here for further evaluation and treatment.     Past Medical History:  Diagnosis Date  . Acid reflux   . Allergy   . Anxiety   . Anxiety and depression   . Asthma   . Depression   . Fibroid   . Heart murmur   . Irritable bowel syndrome   . Migraines   . PONV (postoperative nausea and vomiting)   . Post-traumatic stress disorder   . PTSD (post-traumatic stress disorder)   . Ruptured cyst of ovary 2001  . Ruptured cyst of ovary   . Substance abuse [facility])      Physical Exam: General: The patient is alert and oriented x3 in no acute distress.  Dermatology: Skin is warm, dry and supple bilateral lower extremities. Negative for open lesions or macerations.  Vascular: Palpable pedal pulses bilaterally. No edema or erythema noted. Capillary refill within normal limits.  Neurological: Epicritic and protective threshold grossly intact bilaterally.   Musculoskeletal Exam: Pain with palpation to the peroneal tendons of the right foot. Range of motion within normal limits to all pedal and ankle joints bilateral. Muscle strength 5/5 in all groups bilateral.   Radiographic Exam:  Normal osseous mineralization. Joint spaces preserved. No fracture/dislocation/boney destruction.    Assessment: - Insertional peroneal tendinitis right   Plan of Care:  - Patient evaluated. X-Rays reviewed. - Recommended custom molded insoles in the future to wear with good sneakers. - Prescription for Meloxicam provided to  patient.  - Return to clinic when necessary.    Edrick Kins, DPM [facility]  Dr. Edrick Kins, DPM    [address], [place] [address]                Office [phone]  Fax [phone]

## 2017-08-07 ENCOUNTER — Encounter: Payer: Self-pay | Admitting: Adult Health

## 2017-08-07 ENCOUNTER — Ambulatory Visit (INDEPENDENT_AMBULATORY_CARE_PROVIDER_SITE_OTHER): Payer: Medicaid Other | Admitting: Adult Health

## 2017-08-07 VITALS — BP 110/60 | HR 84 | Ht 65.0 in | Wt 198.0 lb

## 2017-08-07 DIAGNOSIS — F419 Anxiety disorder, unspecified: Secondary | ICD-10-CM | POA: Diagnosis not present

## 2017-08-07 DIAGNOSIS — F329 Major depressive disorder, single episode, unspecified: Secondary | ICD-10-CM | POA: Diagnosis not present

## 2017-08-07 DIAGNOSIS — F32A Depression, unspecified: Secondary | ICD-10-CM

## 2017-08-07 DIAGNOSIS — Z113 Encounter for screening for infections with a predominantly sexual mode of transmission: Secondary | ICD-10-CM | POA: Diagnosis not present

## 2017-08-07 MED ORDER — SERTRALINE HCL 50 MG PO TABS
50.0000 mg | ORAL_TABLET | Freq: Every day | ORAL | 6 refills | Status: DC
Start: 2017-08-07 — End: 2017-09-11

## 2017-08-07 NOTE — Progress Notes (Signed)
Subjective:     Patient ID: Brooke Eaton, female   DOB: 11-27-1975, 42 y.o.   MRN: [id number]  HPI Brooke Eaton is a 42 year old black female in requesting STD testing, boyfriend cheated and she is complaining of anxiety and depression, was diagnosed with PTSD in past.Was seeing [facility] [place], who retired.She says she does not like interacting with people and doe not like to leave her house.She says she was on paxil years ago and has been on lexapro, did not like lexapro, she says she spokes marijuana at times to help. PCP is Dr Meda Coffee.   Review of Systems +Anxiety  +depressed No discharge  Reviewed past medical,surgical, social and family history. Reviewed medications and allergies.     Objective:   Physical Exam BP 110/60 (BP Location: Left Arm, Patient Position: Sitting, Cuff Size: Normal)   Pulse 84   Ht 5\' 5"  (1.651 m)   Wt 198 lb (89.8 kg)   LMP 07/29/2017 (Exact Date)   BMI 32.95 kg/m  Skin warm and dry.Pelvic: external genitalia is normal in appearance no lesions, vagina: scant discharge without odor,urethra has no lesions or masses noted, cervix:smooth and bulbous, uterus: normal size, shape and contour, non tender, no masses felt, adnexa: no masses or tenderness noted. Bladder is non tender and no masses felt.  GC/CHL obtained.    PHQ 9 score 24, denies being suicidal and requests meds.Will Rx Zoloft and refer to [facility].  She declines HIV and RPR today.  Assessment:     1. Anxiety and depression   2. Screen for STD (sexually transmitted disease)       Plan:    GC/CHL sent Use condoms Meds ordered this encounter  Medications  . sertraline (ZOLOFT) 50 MG tablet    Sig: Take 1 tablet (50 mg total) by mouth daily.    Dispense:  30 tablet    Refill:  6    Order Specific Question:   Supervising Provider    Answer:   Florian Buff [[id number]]     Referred to [facility] F/U in 4 weeks

## 2017-08-09 LAB — GC/CHLAMYDIA PROBE AMP
Chlamydia trachomatis, NAA: NEGATIVE
Neisseria gonorrhoeae by PCR: NEGATIVE

## 2017-09-04 ENCOUNTER — Ambulatory Visit: Payer: Medicaid Other | Admitting: Adult Health

## 2017-09-11 ENCOUNTER — Encounter: Payer: Self-pay | Admitting: Adult Health

## 2017-09-11 ENCOUNTER — Ambulatory Visit (INDEPENDENT_AMBULATORY_CARE_PROVIDER_SITE_OTHER): Payer: Medicaid Other | Admitting: Adult Health

## 2017-09-11 VITALS — BP 102/78 | HR 84 | Ht 65.5 in | Wt 202.5 lb

## 2017-09-11 DIAGNOSIS — F419 Anxiety disorder, unspecified: Secondary | ICD-10-CM | POA: Diagnosis not present

## 2017-09-11 DIAGNOSIS — F329 Major depressive disorder, single episode, unspecified: Secondary | ICD-10-CM | POA: Diagnosis not present

## 2017-09-11 DIAGNOSIS — F32A Depression, unspecified: Secondary | ICD-10-CM

## 2017-09-11 MED ORDER — SERTRALINE HCL 25 MG PO TABS: ORAL_TABLET | ORAL | 3 refills | Status: DC

## 2017-09-11 NOTE — Progress Notes (Signed)
Subjective:     Patient ID: Brooke Eaton, female   DOB: 1976/05/18, 42 y.o.   MRN: [id number]  HPI Brooke Eaton is a 42 year old black female in for follow up on starting Zoloft.She says it makes her sleepy at times, and is stilled depressed.Has called [facility] but has not seen them yet.  Review of Systems Still depressed, but Zoloft makes her sleepy at times Reviewed past medical,surgical, social and family history. Reviewed medications and allergies.     Objective:   Physical Exam BP 102/78 (BP Location: Left Arm, Patient Position: Sitting, Cuff Size: Large)   Pulse 84   Ht 5' 5.5" (1.664 m)   Wt 202 lb 8 oz (91.9 kg)   LMP 09/05/2017   BMI 33.19 kg/m  Skin warm and dry.Lungs: clear to ausculation bilaterally. Cardiovascular: regular rate and rhythm. PHQ 9 score 22, denies being suicidal or homicidal but has felt would be better off dead, but would not do it because of daughter, but she says if ever gets that bad will go to ER.Score was 24 in January.   Will increase Zoloft, try taking at different time.She asks for xanax but I told her that [facility] would need to address that.   Assessment:     1. Anxiety and depression       Plan:     Meds ordered this encounter  Medications  . sertraline (ZOLOFT) 25 MG tablet    Sig: Take 3 daily all at once    Dispense:  90 tablet    Refill:  3    Order Specific Question:   Supervising Provider    Answer:   [facility], [place], ASAP F/U in 6 weeks

## 2017-09-29 ENCOUNTER — Telehealth: Payer: Self-pay

## 2017-09-29 ENCOUNTER — Other Ambulatory Visit: Payer: Self-pay

## 2017-09-29 ENCOUNTER — Encounter: Payer: Self-pay | Admitting: Family Medicine

## 2017-09-29 ENCOUNTER — Ambulatory Visit (INDEPENDENT_AMBULATORY_CARE_PROVIDER_SITE_OTHER): Payer: Medicaid Other | Admitting: Family Medicine

## 2017-09-29 VITALS — BP 102/70 | HR 84 | Temp 99.6°F | Resp 18 | Ht 65.0 in | Wt 207.1 lb

## 2017-09-29 DIAGNOSIS — J4520 Mild intermittent asthma, uncomplicated: Secondary | ICD-10-CM

## 2017-09-29 DIAGNOSIS — R059 Cough, unspecified: Secondary | ICD-10-CM

## 2017-09-29 DIAGNOSIS — R05 Cough: Secondary | ICD-10-CM

## 2017-09-29 MED ORDER — AZITHROMYCIN 250 MG PO TABS: ORAL_TABLET | ORAL | 0 refills | Status: DC

## 2017-09-29 MED ORDER — PREDNISONE 20 MG PO TABS: 20.0000 mg | ORAL_TABLET | Freq: Two times a day (BID) | ORAL | 0 refills | Status: DC

## 2017-09-29 MED ORDER — BENZONATATE 200 MG PO CAPS: 200.0000 mg | ORAL_CAPSULE | Freq: Two times a day (BID) | ORAL | 0 refills | Status: DC | PRN

## 2017-09-29 NOTE — Telephone Encounter (Signed)
She will need an appt.

## 2017-09-29 NOTE — Telephone Encounter (Signed)
Having trouble with congestion due to the rain and wants rx called in or call her if you need to see her. [phone]

## 2017-09-29 NOTE — Progress Notes (Signed)
Chief Complaint  Patient presents with  . Cough    x 3 days   Patient has asthma.  She has wheezing whenever she gets respiratory infection and sometimes seasonally.  She states that she started getting sick on Friday.  Over Saturday and Sunday she got worse.  She is having a lot of cough.  Harsh cough with thick mucus.  The mucus is yellow" she takes bad".  She is been using her nebulizer a couple times a day.  She feels short of breath.  She feels very tired.  She has had a fever.  No runny nose or stuffy nose, no sore throat or headache.  All of her symptoms are in her chest.  She has mild anterior rib pain with the coughing.  No nausea or vomiting.  Patient Active Problem List   Diagnosis Date Noted  . Gastritis and gastroduodenitis 03/18/2017  . Abdominal pain, epigastric 10/22/2016  . Esophageal dysphagia 10/22/2016  . Asthma 09/02/2016  . Marijuana use, continuous 09/02/2016  . Menorrhagia with regular cycle 08/16/2016  . Dysmenorrhea 01/17/2012  . PMS (premenstrual syndrome) 01/17/2012  . Anxiety and depression   . Post-traumatic stress disorder   . Irritable bowel syndrome   . GERD (gastroesophageal reflux disease)     Outpatient Encounter Medications as of 09/29/2017  Medication Sig  . albuterol (ACCUNEB) 0.63 MG/3ML nebulizer solution Take 1 ampule by nebulization as needed for wheezing.  . ALBUTEROL IN Inhale into the lungs as needed.  . sertraline (ZOLOFT) 25 MG tablet Take 3 daily all at once  . azithromycin (ZITHROMAX) 250 MG tablet tad  . benzonatate (TESSALON) 200 MG capsule Take 1 capsule (200 mg total) by mouth 2 (two) times daily as needed for cough.  . predniSONE (DELTASONE) 20 MG tablet Take 1 tablet (20 mg total) by mouth 2 (two) times daily with a meal.   No facility-administered encounter medications on file as of 09/29/2017.     Allergies  Allergen Reactions  . Doxycycline Nausea And Vomiting    Cannot tolerate oral doxycycline   . Flagyl  [Metronidazole] Itching  . Latex Itching  . Penicillins Itching    Has patient had a PCN reaction causing immediate rash, facial/tongue/throat swelling, SOB or lightheadedness with hypotension: NO Has patient had a PCN reaction causing severe rash involving mucus membranes or skin necrosis: No Has patient had a PCN reaction that required hospitalization No Has patient had a PCN reaction occurring within the last 10 years: No If all of the above answers are "NO", then may proceed with Cephalosporin use.     Review of Systems  Constitutional: Positive for activity change, fatigue and fever. Negative for appetite change and chills.  HENT: Negative for congestion, postnasal drip, rhinorrhea and sinus pain.   Eyes: Negative for redness and visual disturbance.  Respiratory: Positive for cough, shortness of breath and wheezing.   Cardiovascular: Negative for chest pain and palpitations.  Gastrointestinal: Negative for nausea and vomiting.  Musculoskeletal: Negative for arthralgias and back pain.  Neurological: Negative for dizziness and headaches.  Psychiatric/Behavioral: Positive for sleep disturbance. The patient is not nervous/anxious.     BP 102/70 (BP Location: Left Arm, Patient Position: Sitting, Cuff Size: Normal)   Pulse 84   Temp 99.6 F (37.6 C) (Oral)   Resp 18   Ht 5\' 5"  (1.651 m)   Wt 207 lb 1.9 oz (93.9 kg)   LMP 09/05/2017   SpO2 97%   BMI 34.47 kg/m  Physical Exam  Constitutional: She is oriented to person, place, and time. She appears well-developed and well-nourished.  HENT:  Head: Normocephalic and atraumatic.  Right Ear: External ear normal.  Left Ear: External ear normal.  Mouth/Throat: Oropharynx is clear and moist.  Eyes: Conjunctivae are normal. Pupils are equal, round, and reactive to light.  Neck: Normal range of motion. Neck supple. No thyromegaly present.  Cardiovascular: Normal rate, regular rhythm and normal heart sounds.  Pulmonary/Chest: Effort  normal. No respiratory distress. She has wheezes. She has no rales.  Scattered wheeze.  Harsh cough.  Anterior rhonchi  Abdominal: Soft. Bowel sounds are normal.  Musculoskeletal: Normal range of motion. She exhibits no edema.  Lymphadenopathy:    She has no cervical adenopathy.  Neurological: She is alert and oriented to person, place, and time.  Skin: Skin is warm and dry.  Psychiatric: She has a normal mood and affect. Her behavior is normal. Thought content normal.  Nursing note and vitals reviewed.   ASSESSMENT/PLAN:  1. Mild intermittent asthma without complication Exacerbated by recent illness  2. Cough in adult Discussed that these are often caused by a virus.  Because she does have a fever and colored sputum in addition to the coughing and wheezing would give her prednisone and an antibiotic.  Discussed conservative management, symptomatic care.   Patient Instructions  Take the prednisone twice a day Take the z pack as directed Take the tessalon 2 - 3 times a day for cough Drink lots of fluids Use a humidifier if you have one Continue breathing treatments when needed Call if not better in a week   Raylene Everts, MD

## 2017-09-29 NOTE — Patient Instructions (Addendum)
Take the prednisone twice a day Take the z pack as directed Take the tessalon 2 - 3 times a day for cough Drink lots of fluids Use a humidifier if you have one Continue breathing treatments when needed Call if not better in a week

## 2017-10-13 ENCOUNTER — Encounter: Payer: Self-pay | Admitting: Family Medicine

## 2017-10-20 ENCOUNTER — Other Ambulatory Visit: Payer: Self-pay | Admitting: Family Medicine

## 2017-10-23 ENCOUNTER — Encounter: Payer: Self-pay | Admitting: Adult Health

## 2017-10-23 ENCOUNTER — Ambulatory Visit (INDEPENDENT_AMBULATORY_CARE_PROVIDER_SITE_OTHER): Payer: Medicaid Other | Admitting: Adult Health

## 2017-10-23 ENCOUNTER — Other Ambulatory Visit: Payer: Self-pay

## 2017-10-23 VITALS — BP 104/66 | HR 81 | Ht 65.5 in | Wt 209.0 lb

## 2017-10-23 DIAGNOSIS — B3731 Acute candidiasis of vulva and vagina: Secondary | ICD-10-CM

## 2017-10-23 DIAGNOSIS — B373 Candidiasis of vulva and vagina: Secondary | ICD-10-CM | POA: Diagnosis not present

## 2017-10-23 DIAGNOSIS — F329 Major depressive disorder, single episode, unspecified: Secondary | ICD-10-CM | POA: Diagnosis not present

## 2017-10-23 DIAGNOSIS — F32A Depression, unspecified: Secondary | ICD-10-CM

## 2017-10-23 DIAGNOSIS — N898 Other specified noninflammatory disorders of vagina: Secondary | ICD-10-CM

## 2017-10-23 LAB — POCT WET PREP (WET MOUNT)

## 2017-10-23 MED ORDER — FLUCONAZOLE 150 MG PO TABS: ORAL_TABLET | ORAL | 1 refills | Status: DC

## 2017-10-23 NOTE — Progress Notes (Signed)
Subjective:     Patient ID: Brooke Eaton, female   DOB: 1975/09/02, 42 y.o.   MRN: [id number]  HPI Brooke Eaton is a 42 year old black female in for F/U on increasing Zoloft and she is having some vaginal itching after taking antibiotics for URI.   Review of Systems Vaginal itching Feels a little better with increased Zoloft Reviewed past medical,surgical, social and family history. Reviewed medications and allergies.     Objective:   Physical Exam BP 104/66 (BP Location: Right Arm, Patient Position: Sitting, Cuff Size: Large)   Pulse 81   Ht 5' 5.5" (1.664 m)   Wt 209 lb (94.8 kg)   LMP 10/23/2017   BMI 34.25 kg/m   Skin warm and dry.Pelvic: external genitalia is normal in appearance no lesions, vagina: brown discharge without odor,urethra has no lesions or masses noted, cervix:smooth and bulbous, uterus: normal size, shape and contour, non tender, no masses felt, adnexa: no masses or tenderness noted. Bladder is non tender and no masses felt. Wet prep: + for yeast buds and +WBCs. PHQ 9 score 11 was 22 on 09/11/17, denies being suicidal, still not seen [facility], will refer to [facility] and keep taking Zoloft.    Assessment:     1. Yeast infection of the vagina   2. Vaginal itching   3. Depression, unspecified depression type       Plan:     Meds ordered this encounter  Medications  . fluconazole (DIFLUCAN) 150 MG tablet    Sig: Take 1 now and 1 in 3 days    Dispense:  2 tablet    Refill:  1    Order Specific Question:   Supervising Provider    Answer:   Tania Ade H [[id number]]  Continue Zoloft, has refills Refer to [place] to do Follow up in 8 weeks

## 2017-11-14 ENCOUNTER — Other Ambulatory Visit: Payer: Self-pay

## 2017-11-14 ENCOUNTER — Ambulatory Visit (INDEPENDENT_AMBULATORY_CARE_PROVIDER_SITE_OTHER): Payer: Medicaid Other | Admitting: Family Medicine

## 2017-11-14 ENCOUNTER — Encounter: Payer: Self-pay | Admitting: Family Medicine

## 2017-11-14 VITALS — BP 108/80 | HR 85 | Temp 98.8°F | Resp 12 | Ht 65.5 in | Wt 211.0 lb

## 2017-11-14 DIAGNOSIS — G4700 Insomnia, unspecified: Secondary | ICD-10-CM | POA: Diagnosis not present

## 2017-11-14 DIAGNOSIS — R5383 Other fatigue: Secondary | ICD-10-CM

## 2017-11-14 DIAGNOSIS — M2550 Pain in unspecified joint: Secondary | ICD-10-CM

## 2017-11-14 MED ORDER — DULOXETINE HCL 30 MG PO CPEP: 30.0000 mg | ORAL_CAPSULE | Freq: Every day | ORAL | 1 refills | Status: DC

## 2017-11-14 MED ORDER — GABAPENTIN 300 MG PO CAPS: 300.0000 mg | ORAL_CAPSULE | Freq: Three times a day (TID) | ORAL | 1 refills | Status: DC

## 2017-11-14 NOTE — Progress Notes (Signed)
Chief Complaint  Patient presents with  . requesting blood work    for fibromyalgia,lupus  . requesting bone density scan  . Fatigue  . Insomnia  As above.  Patient is requesting a workup for "fibromyalgia".  Her mother has both fibromyalgia and "crippling" arthritis. She complains of pain in her neck and back.  She complains of pain diffusely throughout her body.  She specifically has pain in her right foot and her right knee.  Her pain is not localized to just joints.  She does not have any swelling or redness of her joints.  Previously diagnosed with irritable bowel syndrome.  She states that she chews her food carefully and takes probiotics she is asymptomatic.  She complains of insomnia.  She complains of anxiety and depression.  She was given sertraline for anxiety by her GYN provider.  She has not taken it consistently.  She thinks it makes her feel "a little better".  She complains that she does not have energy.  She does not sleep well at night and is tired during the day.  She also does not enjoy doing things she used to.  Her PHQ score is high, however she consistently declines referrals to counseling.  She is adamant that she would never harm herself because she is the sole provider of her daughter. She states her mother has osteoporosis and she would like a bone density scan.  I told her it was not medically indicated to do a bone density scan on a menstruating 42 year old who does not have any risk factors.  Patient Active Problem List   Diagnosis Date Noted  . Gastritis and gastroduodenitis 03/18/2017  . Abdominal pain, epigastric 10/22/2016  . Esophageal dysphagia 10/22/2016  . Asthma 09/02/2016  . Marijuana use, continuous 09/02/2016  . Menorrhagia with regular cycle 08/16/2016  . Dysmenorrhea 01/17/2012  . PMS (premenstrual syndrome) 01/17/2012  . Anxiety and depression   . Post-traumatic stress disorder   . Irritable bowel syndrome   . GERD (gastroesophageal reflux  disease)     Outpatient Encounter Medications as of 11/14/2017  Medication Sig  . albuterol (ACCUNEB) 0.63 MG/3ML nebulizer solution Take 1 ampule by nebulization as needed for wheezing.  . ALBUTEROL IN Inhale into the lungs as needed.  . DULoxetine (CYMBALTA) 30 MG capsule Take 1 capsule (30 mg total) by mouth daily.  Marland Kitchen gabapentin (NEURONTIN) 300 MG capsule Take 1 capsule (300 mg total) by mouth 3 (three) times daily.   No facility-administered encounter medications on file as of 11/14/2017.     Allergies  Allergen Reactions  . Doxycycline Nausea And Vomiting    Cannot tolerate oral doxycycline   . Flagyl [Metronidazole] Itching  . Latex Itching  . Penicillins Itching    Has patient had a PCN reaction causing immediate rash, facial/tongue/throat swelling, SOB or lightheadedness with hypotension: NO Has patient had a PCN reaction causing severe rash involving mucus membranes or skin necrosis: No Has patient had a PCN reaction that required hospitalization No Has patient had a PCN reaction occurring within the last 10 years: No If all of the above answers are "NO", then may proceed with Cephalosporin use.     Review of Systems  Constitutional: Positive for fatigue. Negative for activity change, appetite change, chills, fever and unexpected weight change.  HENT: Negative for congestion, dental problem, postnasal drip and rhinorrhea.   Eyes: Negative for redness and visual disturbance.  Respiratory: Negative for cough and shortness of breath.   Cardiovascular: Negative  for chest pain, palpitations and leg swelling.  Gastrointestinal: Negative for abdominal pain, constipation and diarrhea.  Genitourinary: Negative for difficulty urinating, frequency and menstrual problem.  Musculoskeletal: Positive for arthralgias, back pain, myalgias, neck pain and neck stiffness.  Neurological: Negative for dizziness and headaches.  Psychiatric/Behavioral: Positive for dysphoric mood and sleep  disturbance. Negative for self-injury and suicidal ideas. The patient is nervous/anxious.     Physical Exam  Constitutional: She is oriented to person, place, and time. She appears well-developed and well-nourished.  Overweight.  Does not appear uncomfortable.  HENT:  Head: Normocephalic and atraumatic.  Nose: Nose normal.  Eyes: Pupils are equal, round, and reactive to light. EOM are normal.  Neck: Normal range of motion. Neck supple.  Mild tenderness to cervical and upper trapezius muscles  Cardiovascular: Normal rate, regular rhythm and normal heart sounds.  Pulmonary/Chest: Effort normal and breath sounds normal. She has no wheezes.  Abdominal: Soft. Bowel sounds are normal. There is no tenderness.  Musculoskeletal:  Fibromyalgia tender points were palpated.  No tenderness over lateral epicondyle, greater trochanter, insertion of the suboccipital muscle at the occiput, under the sternomastoid muscle or the costochondral junction for the sternum.  Mild tenderness in the mid upper trapezius muscles bilaterally, over the SI joint lateral buttock, and over the right pedis anserine bursa of the knee.  Does not meet criteria for fibromyalgia.  Joint exam reveals no warmth or synovitis.  Neurological: She is alert and oriented to person, place, and time. She displays normal reflexes.  Skin: Skin is warm and dry. No rash noted.  Psychiatric: She has a normal mood and affect. Her behavior is normal.    BP 108/80   Pulse 85   Temp 98.8 F (37.1 C) (Oral)   Resp 12   Ht 5' 5.5" (1.664 m)   Wt 211 lb 0.6 oz (95.7 kg)   LMP 10/23/2017   SpO2 98%   BMI 34.58 kg/m     ASSESSMENT/PLAN:  1. Arthralgia, unspecified joint - CBC - COMPLETE METABOLIC PANEL WITH GFR - TSH - Urinalysis, Routine w reflex microscopic - Sed Rate (ESR) - Rheumatoid Factor - Antinuclear Antib (ANA)  2. Other fatigue  3. Insomnia, unspecified type  She does not have physical exam findings suggestive of an  inflammatory arthritis.  She does not have physical exam findings suggestive of fibromyalgia.  She does have symptoms suggestive of fibromyalgia syndrome with depression, anxiety, insomnia, irritable bowel syndrome, and diffuse pain with lack of energy.  I feel like much of her symptomatology is due to depression, untreated.  She is reluctant to take medicine and refuses counseling. .Greater than 50% of this visit was spent in counseling and coordinating care.  Total face to face time:   25 minutes and above discussion.   Patient Instructions  Laboratory test today See me one day next week Start duloxetine 30 mg a day in the morning Take gabapentin 300 mg a day in the evening The instructions on the bottle will say you can take it up to 3 times a day.  Start with just once a day until you come back and see me Walk/exercise every day you are able   Raylene Everts, MD

## 2017-11-14 NOTE — Patient Instructions (Signed)
Laboratory test today See me one day next week Start duloxetine 30 mg a day in the morning Take gabapentin 300 mg a day in the evening The instructions on the bottle will say you can take it up to 3 times a day.  Start with just once a day until you come back and see me Walk/exercise every day you are able

## 2017-11-18 ENCOUNTER — Telehealth: Payer: Self-pay | Admitting: Family Medicine

## 2017-11-18 ENCOUNTER — Encounter: Payer: Self-pay | Admitting: Family Medicine

## 2017-11-18 ENCOUNTER — Ambulatory Visit: Payer: Medicaid Other | Admitting: Family Medicine

## 2017-11-18 ENCOUNTER — Other Ambulatory Visit: Payer: Self-pay | Admitting: Family Medicine

## 2017-11-18 DIAGNOSIS — R768 Other specified abnormal immunological findings in serum: Secondary | ICD-10-CM

## 2017-11-18 LAB — COMPLETE METABOLIC PANEL WITH GFR
AG RATIO: 1.4 (calc) (ref 1.0–2.5)
ALT: 11 U/L (ref 6–29)
AST: 14 U/L (ref 10–30)
Albumin: 3.9 g/dL (ref 3.6–5.1)
Alkaline phosphatase (APISO): 57 U/L (ref 33–115)
BILIRUBIN TOTAL: 0.2 mg/dL (ref 0.2–1.2)
BUN: 12 mg/dL (ref 7–25)
CHLORIDE: 106 mmol/L (ref 98–110)
CO2: 28 mmol/L (ref 20–32)
Calcium: 8.6 mg/dL (ref 8.6–10.2)
Creat: 0.87 mg/dL (ref 0.50–1.10)
GFR, Est African American: 97 mL/min/{1.73_m2} (ref >=60)
GFR, Est Non African American: 83 mL/min/{1.73_m2} (ref >=60)
GLUCOSE: 94 mg/dL (ref 65–99)
Globulin: 2.8 g/dL (calc) (ref 1.9–3.7)
POTASSIUM: 3.9 mmol/L (ref 3.5–5.3)
Sodium: 139 mmol/L (ref 135–146)
Total Protein: 6.7 g/dL (ref 6.1–8.1)

## 2017-11-18 LAB — ANA: Anti Nuclear Antibody(ANA): POSITIVE — AB

## 2017-11-18 LAB — URINALYSIS, ROUTINE W REFLEX MICROSCOPIC
Bilirubin Urine: NEGATIVE
Glucose, UA: NEGATIVE
HGB URINE DIPSTICK: NEGATIVE
KETONES UR: NEGATIVE
Leukocytes, UA: NEGATIVE
NITRITE: NEGATIVE
PROTEIN: NEGATIVE
Specific Gravity, Urine: 1.016 (ref 1.001–1.03)
pH: 6.5 (ref 5.0–8.0)

## 2017-11-18 LAB — ANTI-NUCLEAR AB-TITER (ANA TITER)

## 2017-11-18 LAB — TSH: TSH: 1.63 m[IU]/L

## 2017-11-18 LAB — CBC
HCT: 35.6 % (ref 35.0–45.0)
Hemoglobin: 11.7 g/dL (ref 11.7–15.5)
MCH: 28.9 pg (ref 27.0–33.0)
MCHC: 32.9 g/dL (ref 32.0–36.0)
MCV: 87.9 fL (ref 80.0–100.0)
MPV: 9.5 fL (ref 7.5–12.5)
PLATELETS: 300 10*3/uL (ref 140–400)
RBC: 4.05 10*6/uL (ref 3.80–5.10)
RDW: 13 % (ref 11.0–15.0)
WBC: 4.8 10*3/uL (ref 3.8–10.8)

## 2017-11-18 LAB — RHEUMATOID FACTOR

## 2017-11-18 LAB — SEDIMENTATION RATE: SED RATE: 39 mm/h — AB (ref 0–20)

## 2017-11-18 NOTE — Telephone Encounter (Signed)
Called patient to let her know I will be sending her Rheumatology referral to [facility] because they accept her insurance. No answer, left her a voicemail. I will complete this tomorrow morning.

## 2017-11-18 NOTE — Telephone Encounter (Signed)
I thought maybe it's better if you speak to her, but I have printed the letter with her results to mail.

## 2017-11-18 NOTE — Telephone Encounter (Signed)
Patient came in and was given a letter.  She understands she has an elevated ANA and needs further workup by rheumatology to confirm.  Rheumatology consultation has been placed.

## 2017-11-18 NOTE — Telephone Encounter (Signed)
Please call the pt to discuss lab results, **please note that she has been released from the practice

## 2017-11-18 NOTE — Telephone Encounter (Signed)
Talked with Brooke Eaton in the office and gave her a copy of the letter Dr Meda Coffee issues today.  She asked me to call Dr Murriel Hopper [phone] regarding her rheumatology and they said they do not see patients for this.  She also asked me to call [facility] at [phone] on [address].  They are going to see her on 12/11/17 at 9:30 am.  I called back Brooke Eaton and she understands the appointment and time.  She asked for me to mail the letter from today and I have put it in the outgoing mail today.  She will have [facility] follow up on her labs since Dr Meda Coffee is leaving [facility] 4/23 and will no longer be treating Brooke Eaton.

## 2017-11-19 ENCOUNTER — Ambulatory Visit: Payer: Medicaid Other | Admitting: Family Medicine

## 2017-11-20 NOTE — Telephone Encounter (Signed)
Letter was mailed to patient.

## 2017-12-18 ENCOUNTER — Ambulatory Visit: Payer: Medicaid Other | Admitting: Adult Health

## 2017-12-18 ENCOUNTER — Encounter: Payer: Self-pay | Admitting: Adult Health

## 2017-12-18 VITALS — BP 118/62 | HR 87 | Ht 65.5 in | Wt 210.0 lb

## 2017-12-18 DIAGNOSIS — F329 Major depressive disorder, single episode, unspecified: Secondary | ICD-10-CM

## 2017-12-18 DIAGNOSIS — F32A Depression, unspecified: Secondary | ICD-10-CM

## 2017-12-18 NOTE — Progress Notes (Signed)
  Subjective:     Patient ID: Brooke Eaton, female   DOB: 05-22-1976, 42 y.o.   MRN: [id number]  HPI Brooke Eaton is a 42 year old black female back in follow up of taking zoloft, has stopped it and is on Cymbalta, now from Dr Meda Coffee, who is treating fibromyalgia.  Has been seen at [facility] in [place] too, about ?lupus diagnosis.   Review of Systems Not sleeping well +body aches Reviewed past medical,surgical, social and family history. Reviewed medications and allergies.     Objective:   Physical Exam BP 118/62 (BP Location: Left Arm, Patient Position: Sitting, Cuff Size: Large)   Pulse 87   Ht 5' 5.5" (1.664 m)   Wt 210 lb (95.3 kg)   BMI 34.41 kg/m  Skin warm and dry.  Lungs: clear to ausculation bilaterally. Cardiovascular: regular rate and rhythm.    Assessment:     1. Depression, unspecified depression type       Plan:     Try taking Neurontin at bedtime Return in 4 weeks for pap and physical

## 2018-01-13 ENCOUNTER — Encounter: Payer: Self-pay | Admitting: Adult Health

## 2018-01-13 ENCOUNTER — Other Ambulatory Visit: Payer: Self-pay | Admitting: Adult Health

## 2018-01-13 ENCOUNTER — Other Ambulatory Visit: Payer: Self-pay

## 2018-01-13 ENCOUNTER — Other Ambulatory Visit (HOSPITAL_COMMUNITY)
Admission: RE | Admit: 2018-01-13 | Discharge: 2018-01-13 | Disposition: A | Discharge status: Home / Self Care | Payer: Medicaid Other | Source: Ambulatory Visit | Attending: Adult Health | Admitting: Adult Health

## 2018-01-13 ENCOUNTER — Ambulatory Visit: Payer: Medicaid Other | Admitting: Adult Health

## 2018-01-13 VITALS — BP 109/74 | HR 76 | Resp 18 | Ht 65.5 in | Wt 214.0 lb

## 2018-01-13 DIAGNOSIS — N926 Irregular menstruation, unspecified: Secondary | ICD-10-CM

## 2018-01-13 DIAGNOSIS — Z1212 Encounter for screening for malignant neoplasm of rectum: Secondary | ICD-10-CM

## 2018-01-13 DIAGNOSIS — Z1211 Encounter for screening for malignant neoplasm of colon: Secondary | ICD-10-CM

## 2018-01-13 DIAGNOSIS — R1013 Epigastric pain: Secondary | ICD-10-CM | POA: Diagnosis not present

## 2018-01-13 DIAGNOSIS — Z124 Encounter for screening for malignant neoplasm of cervix: Secondary | ICD-10-CM | POA: Insufficient documentation

## 2018-01-13 DIAGNOSIS — Z01419 Encounter for gynecological examination (general) (routine) without abnormal findings: Secondary | ICD-10-CM | POA: Insufficient documentation

## 2018-01-13 DIAGNOSIS — Z0001 Encounter for general adult medical examination with abnormal findings: Secondary | ICD-10-CM

## 2018-01-13 DIAGNOSIS — Z1231 Encounter for screening mammogram for malignant neoplasm of breast: Secondary | ICD-10-CM

## 2018-01-13 LAB — HEMOCCULT GUIAC POC 1CARD (OFFICE): Fecal Occult Blood, POC: NEGATIVE

## 2018-01-13 MED ORDER — MEGESTROL ACETATE 40 MG PO TABS: ORAL_TABLET | ORAL | 3 refills | Status: DC

## 2018-01-13 NOTE — Progress Notes (Signed)
Patient ID: Brooke Eaton, female   DOB: 05/10/1976, 42 y.o.   MRN: [id number] History of Present Illness: Brooke Eaton is a 42 year old black female in for a well woman gyn exam and pap.   Current Medications, Allergies, Past Medical History, Past Surgical History, Family History and Social History were reviewed in [employer] record.     Review of Systems: Patient denies any headaches, hearing loss, fatigue, blurred vision, shortness of breath, chest pain, problems with bowel movements, urination, or intercourse. No joint pain or mood swings. Having bleeding when wipes after ablation. +abdominal pain at times   Physical Exam:BP 109/74 (BP Location: Right Arm, Patient Position: Sitting, Cuff Size: Normal)   Pulse 76   Resp 18   Ht 5' 5.5" (1.664 m)   Wt 214 lb (97.1 kg)   BMI 35.07 kg/m  General:  Well developed, well nourished, no acute distress Skin:  Warm and dry Neck:  Midline trachea, normal thyroid, good ROM, no lymphadenopathy Lungs; Clear to auscultation bilaterally Breast:  No dominant palpable mass, retraction, or nipple discharge Cardiovascular: Regular rate and rhythm Abdomen:  Soft, no hepatosplenomegaly +tender in epigastric area  Pelvic:  External genitalia is normal in appearance, no lesions.  The vagina is normal in appearance, with dark brown discharge, like old blood. Urethra has no lesions or masses. The cervix is bulbous. Pap with GC/CHL, and HPV. Uterus is felt to be normal size, shape, and contour.  No adnexal masses or tenderness noted.Bladder is non tender, no masses felt. Rectal: Good sphincter tone, no polyps, or hemorrhoids felt.  Hemoccult negative. Extremities/musculoskeletal:  No swelling or varicosities noted, no clubbing or cyanosis Psych:  No mood changes, alert and cooperative,seems happy PHQ 2 score 0. Will try megace to see if stops spotting and will get [place] to assess epigastric pain.   Impression: 1. Encounter for gynecological  examination with Papanicolaou smear of cervix   2. Routine cervical smear   3. Screening for colorectal cancer   4. Irregular bleeding   5. Epigastric pain       Plan: Meds ordered this encounter  Medications  . megestrol (MEGACE) 40 MG tablet    Sig: Take 1 daily to stop bleeding    Dispense:  30 tablet    Refill:  3    Order Specific Question:   Supervising Provider    Answer:   Florian Buff [[id number]]  Abdominal US scheduled for 6/17 at 9:30 am at [place] [facility]  Physical in 1 year Pap in 3 if normal

## 2018-01-15 ENCOUNTER — Other Ambulatory Visit: Payer: Medicaid Other | Admitting: Adult Health

## 2018-01-15 LAB — CYTOLOGY - PAP
CHLAMYDIA, DNA PROBE: NEGATIVE
DIAGNOSIS: NEGATIVE
HPV: NOT DETECTED
Neisseria Gonorrhea: NEGATIVE

## 2018-01-19 ENCOUNTER — Ambulatory Visit (HOSPITAL_COMMUNITY): Admission: RE | Admit: 2018-01-19 | Payer: Medicaid Other | Source: Ambulatory Visit

## 2018-01-19 ENCOUNTER — Ambulatory Visit (HOSPITAL_COMMUNITY): Payer: Medicaid Other

## 2018-01-29 DIAGNOSIS — Z029 Encounter for administrative examinations, unspecified: Secondary | ICD-10-CM

## 2018-02-03 ENCOUNTER — Encounter: Payer: Self-pay | Admitting: Gastroenterology

## 2018-02-17 ENCOUNTER — Other Ambulatory Visit: Payer: Self-pay | Admitting: Adult Health

## 2018-02-17 DIAGNOSIS — Z1231 Encounter for screening mammogram for malignant neoplasm of breast: Secondary | ICD-10-CM

## 2018-02-26 ENCOUNTER — Ambulatory Visit: Payer: Medicaid Other | Admitting: Obstetrics and Gynecology

## 2018-02-26 ENCOUNTER — Encounter: Payer: Self-pay | Admitting: Obstetrics and Gynecology

## 2018-02-26 VITALS — BP 108/76 | HR 68 | Ht 66.0 in | Wt 209.8 lb

## 2018-02-26 DIAGNOSIS — L739 Follicular disorder, unspecified: Secondary | ICD-10-CM | POA: Diagnosis not present

## 2018-02-26 MED ORDER — FLUCONAZOLE 150 MG PO TABS: 150.0000 mg | ORAL_TABLET | Freq: Once | ORAL | 1 refills | Status: AC

## 2018-02-26 MED ORDER — CIPROFLOXACIN HCL 500 MG PO TABS: 500.0000 mg | ORAL_TABLET | Freq: Two times a day (BID) | ORAL | 0 refills | Status: DC

## 2018-02-26 NOTE — Progress Notes (Signed)
Patient ID: Brooke Eaton, female   DOB: [birth year], 42 y.o.   MRN: [id number]    [facility] Visit  @DATE @            Patient name: Brooke Eaton MRN [id number]  Date of birth: [birth year]  CC & HPI:  Brooke Eaton is a 42 y.o. female presenting today for a dark-colored deep cyst on the inside of her left thigh. They never come to the surface of her skin. She has had a few before but this one is worse. She tried taking baths and sitting in the hot tub but it did not help. The patient denies fever, chills or any other symptoms or complaints at this time.   ROS:  ROS + cyst on inner left thigh -fever -chills All systems are negative except as noted in the HPI and PMH.   Pertinent History Reviewed:   Reviewed:  Medical         Past Medical History:  Diagnosis Date  . Acid reflux   . Allergy   . Anxiety   . Anxiety and depression   . Asthma   . Depression   . Fibroid   . Fibromyalgia   . Heart murmur   . Irritable bowel syndrome   . Migraines   . Plantar fasciitis   . PONV (postoperative nausea and vomiting)   . Post-traumatic stress disorder   . PTSD (post-traumatic stress disorder)   . Ruptured cyst of ovary 2001  . Ruptured cyst of ovary   . Substance abuse [facility])                               Surgical Hx:    Past Surgical History:  Procedure Laterality Date  . ADENOIDECTOMY    . COLONOSCOPY N/A 11/07/2016   Procedure: COLONOSCOPY;  Surgeon: Danie Binder, MD;  [facility];  Service: Endoscopy;  Laterality: N/A;  1:30pm-rescheduled to 4/5 at 10:30am per Ginger  . DILATION AND CURETTAGE OF UTERUS    . DILITATION & CURRETTAGE/HYSTROSCOPY WITH NOVASURE ABLATION N/A 10/01/2016   Procedure: DILATATION & CURETTAGE/HYSTEROSCOPY WITH NOVASURE ABLATION AND EXCISION OF VULVAR/GLUTEAL  CYST;  Surgeon: Jonnie Kind, MD;  Location: [facility];  Service: Gynecology;  Laterality: N/A;  . ESOPHAGOGASTRODUODENOSCOPY N/A 11/07/2016   Procedure: ESOPHAGOGASTRODUODENOSCOPY  (EGD);  Surgeon: Danie Binder, MD;  [facility];  Service: Endoscopy;  Laterality: N/A;  . SAVORY DILATION N/A 11/07/2016   Procedure: SAVORY DILATION;  Surgeon: Danie Binder, MD;  [facility];  Service: Endoscopy;  Laterality: N/A;  . TONSILLECTOMY    . TUBAL LIGATION  2016  . WISDOM TOOTH EXTRACTION     Medications: Reviewed & Updated - see associated section                       Current Outpatient Medications:  .  albuterol (ACCUNEB) 0.63 MG/3ML nebulizer solution, Take 1 ampule by nebulization as needed for wheezing., Disp: , Rfl:  .  ALBUTEROL IN, Inhale into the lungs as needed., Disp: , Rfl:  .  DULoxetine (CYMBALTA) 30 MG capsule, Take 1 capsule (30 mg total) by mouth daily., Disp: 30 capsule, Rfl: 1 .  gabapentin (NEURONTIN) 300 MG capsule, Take 1 capsule (300 mg total) by mouth 3 (three) times daily., Disp: 90 capsule, Rfl: 1 .  megestrol (MEGACE) 40 MG tablet, Take 1 daily to stop bleeding,  Disp: 30 tablet, Rfl: 3   Social History: Reviewed -  reports that she has never smoked. She has never used smokeless tobacco.  Objective Findings:  Vitals: Blood pressure 108/76, pulse 68, height 5\' 6"  (1.676 m), weight 209 lb 12.8 oz (95.2 kg).  PHYSICAL EXAMINATION General appearance - alert, well appearing, and in no distress, oriented to person, place, and time and overweight Mental status - alert, oriented to person, place, and time, normal mood, behavior, speech, dress, motor activity, and thought processes, affect appropriate to mood Extremities: left thigh folliculitis  PELVIC Vagina - moderate discharge  Assessment & Plan:   A:  1.  Left Thigh Folliculitis  P:  1. Rx Diflucan for 1 days 2. Rx Cipro 500 bid x 7 d   By signing my name below, I, De Burrs, attest that this documentation has been prepared under the direction and in the presence of Jonnie Kind, MD. Electronically Signed: De Burrs, Medical Scribe. 02/26/18. 11:08  AM.  I personally performed the services described in this documentation, which was SCRIBED in my presence. The recorded information has been reviewed and considered accurate. It has been edited as necessary during review. Jonnie Kind, MD

## 2018-03-09 ENCOUNTER — Ambulatory Visit
Admission: RE | Admit: 2018-03-09 | Discharge: 2018-03-09 | Disposition: A | Discharge status: Home / Self Care | Payer: Medicaid Other | Source: Ambulatory Visit | Attending: Adult Health | Admitting: Adult Health

## 2018-03-09 DIAGNOSIS — Z1231 Encounter for screening mammogram for malignant neoplasm of breast: Secondary | ICD-10-CM

## 2018-03-09 DIAGNOSIS — M79676 Pain in unspecified toe(s): Secondary | ICD-10-CM

## 2018-04-05 ENCOUNTER — Encounter (HOSPITAL_COMMUNITY): Payer: Self-pay | Admitting: Emergency Medicine

## 2018-04-05 ENCOUNTER — Emergency Department (HOSPITAL_COMMUNITY)
Admission: EM | Admit: 2018-04-05 | Discharge: 2018-04-05 | Disposition: A | Discharge status: Home / Self Care | Payer: Medicaid Other | Attending: Emergency Medicine | Admitting: Emergency Medicine

## 2018-04-05 ENCOUNTER — Other Ambulatory Visit: Payer: Self-pay

## 2018-04-05 DIAGNOSIS — Z91040 Latex allergy status: Secondary | ICD-10-CM | POA: Diagnosis not present

## 2018-04-05 DIAGNOSIS — M62830 Muscle spasm of back: Secondary | ICD-10-CM | POA: Diagnosis not present

## 2018-04-05 DIAGNOSIS — M545 Low back pain: Secondary | ICD-10-CM | POA: Diagnosis present

## 2018-04-05 DIAGNOSIS — Z79899 Other long term (current) drug therapy: Secondary | ICD-10-CM | POA: Insufficient documentation

## 2018-04-05 DIAGNOSIS — J45909 Unspecified asthma, uncomplicated: Secondary | ICD-10-CM | POA: Diagnosis not present

## 2018-04-05 DIAGNOSIS — M5441 Lumbago with sciatica, right side: Secondary | ICD-10-CM | POA: Diagnosis not present

## 2018-04-05 MED ORDER — ONDANSETRON HCL 4 MG PO TABS
4.0000 mg | ORAL_TABLET | Freq: Once | ORAL | Status: AC
  Administered 2018-04-05: 4 mg via ORAL
  Filled 2018-04-05: qty 1

## 2018-04-05 MED ORDER — CYCLOBENZAPRINE HCL 10 MG PO TABS: 10.0000 mg | ORAL_TABLET | Freq: Three times a day (TID) | ORAL | 0 refills | Status: DC

## 2018-04-05 MED ORDER — DEXAMETHASONE SODIUM PHOSPHATE 10 MG/ML IJ SOLN
10.0000 mg | Freq: Once | INTRAMUSCULAR | Status: AC
  Administered 2018-04-05: 10 mg via INTRAMUSCULAR
  Filled 2018-04-05: qty 1

## 2018-04-05 MED ORDER — KETOROLAC TROMETHAMINE 30 MG/ML IJ SOLN
30.0000 mg | Freq: Once | INTRAMUSCULAR | Status: AC
  Administered 2018-04-05: 30 mg via INTRAMUSCULAR
  Filled 2018-04-05: qty 1

## 2018-04-05 MED ORDER — CYCLOBENZAPRINE HCL 10 MG PO TABS
10.0000 mg | ORAL_TABLET | Freq: Once | ORAL | Status: AC
  Administered 2018-04-05: 10 mg via ORAL
  Filled 2018-04-05: qty 1

## 2018-04-05 NOTE — ED Provider Notes (Signed)
[facility] EMERGENCY DEPARTMENT Provider Note   CSN: [id number] Arrival date & time: 04/05/18  1052     History   Chief Complaint Chief Complaint  Patient presents with  . Back Pain    HPI Brooke Eaton is a 42 y.o. female.  Patient is a 42 year old female who presents to the emergency department with a complaint of lower back pain.  The patient states that she was diagnosed with back pain and sciatica during her pregnancy and delivery of her daughter few years ago.  She says that from time to time she has some discomfort of her back.  On yesterday she developed more severe pain mostly in the lower back and buttocks.  She says it is keeping her up at night.  She has tried her current medications and they have not been successful.  No recent injury or trauma to the lower back.  No recent operations or procedures involving the lower back.  The history is provided by the patient.  Back Pain   Pertinent negatives include no chest pain, no abdominal pain and no dysuria.    Past Medical History:  Diagnosis Date  . Acid reflux   . Allergy   . Anxiety   . Anxiety and depression   . Asthma   . Depression   . Fibroid   . Fibromyalgia   . Heart murmur   . Irritable bowel syndrome   . Migraines   . Plantar fasciitis   . PONV (postoperative nausea and vomiting)   . Post-traumatic stress disorder   . PTSD (post-traumatic stress disorder)   . Ruptured cyst of ovary 2001  . Ruptured cyst of ovary   . Substance abuse [facility])     Patient Active Problem List   Diagnosis Date Noted  . Epigastric pain 01/13/2018  . Irregular bleeding 01/13/2018  . Screening for colorectal cancer 01/13/2018  . Encounter for gynecological examination with Papanicolaou smear of cervix 01/13/2018  . Gastritis and gastroduodenitis 03/18/2017  . Abdominal pain, epigastric 10/22/2016  . Esophageal dysphagia 10/22/2016  . Asthma 09/02/2016  . Marijuana use, continuous 09/02/2016  . Menorrhagia with  regular cycle 08/16/2016  . Dysmenorrhea 01/17/2012  . PMS (premenstrual syndrome) 01/17/2012  . Anxiety and depression   . Post-traumatic stress disorder   . Irritable bowel syndrome   . GERD (gastroesophageal reflux disease)     Past Surgical History:  Procedure Laterality Date  . ADENOIDECTOMY    . COLONOSCOPY N/A 11/07/2016   Procedure: COLONOSCOPY;  Surgeon: Danie Binder, MD;  [facility];  Service: Endoscopy;  Laterality: N/A;  1:30pm-rescheduled to 4/5 at 10:30am per Ginger  . DILATION AND CURETTAGE OF UTERUS    . DILITATION & CURRETTAGE/HYSTROSCOPY WITH NOVASURE ABLATION N/A 10/01/2016   Procedure: DILATATION & CURETTAGE/HYSTEROSCOPY WITH NOVASURE ABLATION AND EXCISION OF VULVAR/GLUTEAL  CYST;  Surgeon: Jonnie Kind, MD;  Location: [facility];  Service: Gynecology;  Laterality: N/A;  . ESOPHAGOGASTRODUODENOSCOPY N/A 11/07/2016   Procedure: ESOPHAGOGASTRODUODENOSCOPY (EGD);  Surgeon: Danie Binder, MD;  [facility];  Service: Endoscopy;  Laterality: N/A;  . SAVORY DILATION N/A 11/07/2016   Procedure: SAVORY DILATION;  Surgeon: Danie Binder, MD;  [facility];  Service: Endoscopy;  Laterality: N/A;  . TONSILLECTOMY    . TUBAL LIGATION  2016  . WISDOM TOOTH EXTRACTION       OB History    Gravida  3   Para  1   Term  1  Preterm      AB  2   Living  1     SAB  2   TAB      Ectopic      Multiple      Live Births  1        Obstetric Comments  PT HAD ONE STILL BIRTH         Home Medications    Prior to Admission medications   Medication Sig Start Date End Date Taking? Authorizing Provider  albuterol (ACCUNEB) 0.63 MG/3ML nebulizer solution Take 1 ampule by nebulization as needed for wheezing.    [provider]  ALBUTEROL IN Inhale into the lungs as needed.    [provider]  ciprofloxacin (CIPRO) 500 MG tablet Take 1 tablet (500 mg total) by mouth 2 (two) times daily. 02/26/18   Jonnie Kind, MD    cyclobenzaprine (FLEXERIL) 10 MG tablet Take 1 tablet (10 mg total) by mouth 3 (three) times daily. 04/05/18   Lily Kocher, PA-C  DULoxetine (CYMBALTA) 30 MG capsule Take 1 capsule (30 mg total) by mouth daily. 11/14/17   Raylene Everts, MD  gabapentin (NEURONTIN) 300 MG capsule Take 1 capsule (300 mg total) by mouth 3 (three) times daily. 11/14/17   Raylene Everts, MD  megestrol (MEGACE) 40 MG tablet Take 1 daily to stop bleeding 01/13/18   Estill Dooms, NP    Family History Family History  Problem Relation Age of Onset  . Emphysema Maternal Grandmother   . Gout Maternal Grandmother   . Diabetes Maternal Grandmother   . Gout Father   . Alcohol abuse Father   . Arthritis Father   . COPD Father   . Depression Father   . Drug abuse Father   . Hyperlipidemia Father   . Hypertension Father   . Gout Mother   . Arthritis Mother        RA  . Diabetes Mother   . Depression Mother   . Heart disease Mother   . Hyperlipidemia Mother   . Hypertension Mother   . Vision loss Mother   . Thyroid disease Maternal Aunt   . Diabetes Maternal Uncle   . Breast cancer Other   . Breast cancer Paternal Aunt 20  . Breast cancer Paternal Aunt 1  . Colon cancer Neg Hx   . Inflammatory bowel disease Neg Hx   . Celiac disease Neg Hx     Social History Social History   Tobacco Use  . Smoking status: Never Smoker  . Smokeless tobacco: Never Used  Substance Use Topics  . Alcohol use: Yes    Frequency: Never    Comment: WINE OCCASIONAL  . Drug use: Yes    Types: Marijuana    Comment: daily     Allergies   Doxycycline; Flagyl [metronidazole]; Latex; and Penicillins   Review of Systems Review of Systems  Constitutional: Negative for activity change.       All ROS Neg except as noted in HPI  HENT: Negative for nosebleeds.   Eyes: Negative for photophobia and discharge.  Respiratory: Negative for cough, shortness of breath and wheezing.   Cardiovascular: Negative for  chest pain and palpitations.  Gastrointestinal: Negative for abdominal pain and blood in stool.  Genitourinary: Negative for dysuria, frequency and hematuria.  Musculoskeletal: Positive for back pain. Negative for arthralgias and neck pain.  Skin: Negative.   Neurological: Negative for dizziness, seizures and speech difficulty.  Psychiatric/Behavioral: Negative for confusion and  hallucinations.     Physical Exam Updated Vital Signs BP (!) 147/95 (BP Location: Right Arm)   Pulse 86   Temp 98.4 F (36.9 C) (Oral)   Resp 18   Ht 5\' 6"  (1.676 m)   Wt 95.3 kg   SpO2 99%   BMI 33.89 kg/m   Physical Exam  Constitutional: She is oriented to person, place, and time. She appears well-developed and well-nourished.  Non-toxic appearance.  HENT:  Head: Normocephalic.  Right Ear: Tympanic membrane and external ear normal.  Left Ear: Tympanic membrane and external ear normal.  Eyes: Pupils are equal, round, and reactive to light. EOM and lids are normal.  Neck: Normal range of motion. Neck supple. Carotid bruit is not present.  Cardiovascular: Normal rate, regular rhythm, normal heart sounds, intact distal pulses and normal pulses.  Pulmonary/Chest: Breath sounds normal. No respiratory distress.  Abdominal: Soft. Bowel sounds are normal. There is no tenderness. There is no guarding.  Musculoskeletal: Normal range of motion. She exhibits tenderness.       Lumbar back: She exhibits pain and spasm.       Back:  Lymphadenopathy:       Head (right side): No submandibular adenopathy present.       Head (left side): No submandibular adenopathy present.    She has no cervical adenopathy.  Neurological: She is alert and oriented to person, place, and time. She has normal strength. No cranial nerve deficit or sensory deficit.  Skin: Skin is warm and dry.  Psychiatric: She has a normal mood and affect. Her speech is normal.  Nursing note and vitals reviewed.    ED Treatments / Results   Labs (all labs ordered are listed, but only abnormal results are displayed) Labs Reviewed - No data to display  EKG None  Radiology No results found.  Procedures Procedures (including critical care time)  Medications Ordered in ED Medications  ketorolac (TORADOL) 30 MG/ML injection 30 mg (has no administration in time range)  dexamethasone (DECADRON) injection 10 mg (has no administration in time range)  cyclobenzaprine (FLEXERIL) tablet 10 mg (has no administration in time range)  ondansetron (ZOFRAN) tablet 4 mg (has no administration in time range)     Initial Impression / Assessment and Plan / ED Course  I have reviewed the triage vital signs and the nursing notes.  Pertinent labs & imaging results that were available during my care of the patient were reviewed by me and considered in my medical decision making (see chart for details).       Final Clinical Impressions(s) / ED Diagnoses MDM  Blood pressure is elevated at 147/95, otherwise vital signs are within normal limits.  Pulse oximetry is 99% on room air.  Within normal limits by my interpretation.  No gross neurologic deficit appreciated on examination at this time.  Patient has pain with certain range of motion.  And with change of position mostly in the right buttocks and extending down into the right leg.  The patient has not had any loss of bowel or bladder function, or signs of cauda equina or other emergent changes.  The patient will be treated with Flexeril being added to her current medications.  The patient is to follow-up with her primary physician for referral to orthopedics.  Patient treated in the emergency department with an injection of Toradol and Decadron.  Oral Flexeril given to the patient.  Patient is in agreement with this plan.   Final diagnoses:  Acute right-sided low  back pain with right-sided sciatica  Muscle spasm of back    ED Discharge Orders         Ordered    cyclobenzaprine  (FLEXERIL) 10 MG tablet  3 times daily     04/05/18 1127           Lily Kocher, PA-C 04/05/18 1134    Milton Ferguson, MD 04/05/18 1510

## 2018-04-05 NOTE — ED Triage Notes (Signed)
Patient c/o right lower back/buttock pain that radiates into right leg. Denies any complications with BMs or urination. CNS intact.

## 2018-04-05 NOTE — Discharge Instructions (Addendum)
Your examination suggest muscle spasm in your lower back, and you have given this information on a history of sciatica.  Please use a heating pad to this area.  Please continue your current medications.  Add Flexeril 3 times daily.  Please do not drive a vehicle, operate machinery, drink alcohol, or participate in activities requiring concentration when taking the Flexeril medication.  Please see your primary physician for orthopedic referral concerning your back.

## 2018-08-26 ENCOUNTER — Ambulatory Visit (INDEPENDENT_AMBULATORY_CARE_PROVIDER_SITE_OTHER): Payer: Medicaid Other | Admitting: Obstetrics and Gynecology

## 2018-08-26 ENCOUNTER — Encounter: Payer: Self-pay | Admitting: Obstetrics & Gynecology

## 2018-08-26 VITALS — BP 137/83 | HR 84 | Ht 65.5 in | Wt 211.4 lb

## 2018-08-26 DIAGNOSIS — L739 Follicular disorder, unspecified: Secondary | ICD-10-CM | POA: Diagnosis not present

## 2018-08-26 NOTE — Addendum Note (Signed)
Addended by: Christiana Pellant A on: 08/26/2018 11:11 AM   Modules accepted: Orders

## 2018-08-26 NOTE — Progress Notes (Signed)
Patient ID: Brooke Eaton, female   DOB: 10-Aug-1975, 43 y.o.   MRN: [id number]   [facility] Visit  @DATE @            Patient name: Brooke Eaton MRN [id number]  Date of birth: 1976-02-16  CC & HPI:  [name] is a 43 y.o. female presenting today for vaginal bleeding, and follow-up of a left inguinal follicular abscess had period first of the month and has spotting during periods and only notices when she wipes when using the bathroom, denies pain and itching or vaginal discharge. She has a cyst on left inner though just below thigh crease has started to drain on its on for a few months. Drainage causes spots on her clothes and underwear. Cyst is more irritated when underwear or pants rub against it.  ROS:  ROS +cyst inner left thigh +light cervical bleeding -vaginal discharge or odor -vaginal itching  All systems are negative except as noted in the HPI and PMH.   Pertinent History Reviewed:   Reviewed:tubal ligation and ablation Medical         Past Medical History:  Diagnosis Date  . Acid reflux   . Allergy   . Anxiety   . Anxiety and depression   . Asthma   . Depression   . Fibroid   . Fibromyalgia   . Heart murmur   . Irritable bowel syndrome   . Migraines   . Plantar fasciitis   . PONV (postoperative nausea and vomiting)   . Post-traumatic stress disorder   . PTSD (post-traumatic stress disorder)   . Ruptured cyst of ovary 2001  . Ruptured cyst of ovary   . Substance abuse [facility])                               Surgical Hx:    Past Surgical History:  Procedure Laterality Date  . ADENOIDECTOMY    . COLONOSCOPY N/A 11/07/2016   Procedure: COLONOSCOPY;  Surgeon: Danie Binder, MD;  [facility];  Service: Endoscopy;  Laterality: N/A;  1:30pm-rescheduled to 4/5 at 10:30am per Ginger  . DILATION AND CURETTAGE OF UTERUS    . DILITATION & CURRETTAGE/HYSTROSCOPY WITH NOVASURE ABLATION N/A 10/01/2016   Procedure: DILATATION & CURETTAGE/HYSTEROSCOPY WITH  NOVASURE ABLATION AND EXCISION OF VULVAR/GLUTEAL  CYST;  Surgeon: Jonnie Kind, MD;  Location: [facility];  Service: Gynecology;  Laterality: N/A;  . ESOPHAGOGASTRODUODENOSCOPY N/A 11/07/2016   Procedure: ESOPHAGOGASTRODUODENOSCOPY (EGD);  Surgeon: Danie Binder, MD;  [facility];  Service: Endoscopy;  Laterality: N/A;  . SAVORY DILATION N/A 11/07/2016   Procedure: SAVORY DILATION;  Surgeon: Danie Binder, MD;  [facility];  Service: Endoscopy;  Laterality: N/A;  . TONSILLECTOMY    . TUBAL LIGATION  2016  . WISDOM TOOTH EXTRACTION     Medications: Reviewed & Updated - see associated section                       Current Outpatient Medications:  .  albuterol (ACCUNEB) 0.63 MG/3ML nebulizer solution, Take 1 ampule by nebulization as needed for wheezing., Disp: , Rfl:  .  ALBUTEROL IN, Inhale into the lungs as needed., Disp: , Rfl:  .  ciprofloxacin (CIPRO) 500 MG tablet, Take 1 tablet (500 mg total) by mouth 2 (two) times daily., Disp: 14 tablet, Rfl: 0 .  cyclobenzaprine (FLEXERIL) 10 MG tablet, Take  1 tablet (10 mg total) by mouth 3 (three) times daily., Disp: 20 tablet, Rfl: 0 .  DULoxetine (CYMBALTA) 30 MG capsule, Take 1 capsule (30 mg total) by mouth daily., Disp: 30 capsule, Rfl: 1 .  gabapentin (NEURONTIN) 300 MG capsule, Take 1 capsule (300 mg total) by mouth 3 (three) times daily., Disp: 90 capsule, Rfl: 1 .  megestrol (MEGACE) 40 MG tablet, Take 1 daily to stop bleeding, Disp: 30 tablet, Rfl: 3   Social History: Reviewed -  reports that she has never smoked. She has never used smokeless tobacco.  Objective Findings:  Vitals: There were no vitals taken for this visit.  PHYSICAL EXAMINATION General appearance - alert, well appearing, and in no distress and oriented to person, place, and time Mental status - normal mood, behavior, speech, dress, motor activity, and thought processes, affect appropriate to mood  PELVIC External genitalia - cyst on left inner  thigh, draining Vagina - normal in appearance Cervix - thin stream of light blood Wet prep- no clue, no trich, normal epithelial    I & D NOTE 3 cc of 1% lidocaine used to anesthetize the left inguinal drain chronic abscess/cyst.  Used #13 blade to open the abscess in a Y-shaped incision 2 to 3 cc of purulent material without malodor removed.  Silver nitrate also used within the base of cyst.   Assessment & Plan:   A:  1. Follicular abscess inner thigh, drained in office 2. Abnormal uterine bleeding low level of concern expect spontaneous resolution\ 3. No evidence of vaginal infection, await GC and Chlamydia P:  1.  PRN 2. Follow-up GC chlamydia 3. Topical Neosporin to inguinal crease   By signing my name below, I, [name], attest that this documentation has been prepared under the direction and in the presence of Jonnie Kind, MD. Electronically Signed: [place]. 08/26/18. 10:22 AM.  I personally performed the services described in this documentation, which was SCRIBED in my presence. The recorded information has been reviewed and considered accurate. It has been edited as necessary during review. Jonnie Kind, MD

## 2018-08-28 LAB — GC/CHLAMYDIA PROBE AMP
CHLAMYDIA, DNA PROBE: NEGATIVE
NEISSERIA GONORRHOEAE BY PCR: NEGATIVE

## 2018-10-12 ENCOUNTER — Ambulatory Visit: Payer: Medicaid Other | Admitting: Gastroenterology

## 2018-11-18 ENCOUNTER — Other Ambulatory Visit: Payer: Self-pay

## 2018-11-18 ENCOUNTER — Encounter: Payer: Self-pay | Admitting: Nurse Practitioner

## 2018-11-18 ENCOUNTER — Ambulatory Visit (INDEPENDENT_AMBULATORY_CARE_PROVIDER_SITE_OTHER): Payer: Medicaid Other | Admitting: Nurse Practitioner

## 2018-11-18 DIAGNOSIS — R112 Nausea with vomiting, unspecified: Secondary | ICD-10-CM | POA: Insufficient documentation

## 2018-11-18 DIAGNOSIS — K219 Gastro-esophageal reflux disease without esophagitis: Secondary | ICD-10-CM

## 2018-11-18 DIAGNOSIS — R1013 Epigastric pain: Secondary | ICD-10-CM

## 2018-11-18 MED ORDER — LIDOCAINE VISCOUS HCL 2 % MT SOLN: 15.0000 mL | Freq: Four times a day (QID) | OROMUCOSAL | 1 refills | Status: DC | PRN

## 2018-11-18 MED ORDER — ONDANSETRON 4 MG PO TBDP: 4.0000 mg | ORAL_TABLET | Freq: Three times a day (TID) | ORAL | 2 refills | Status: DC | PRN

## 2018-11-18 NOTE — Assessment & Plan Note (Signed)
Noted epigastric/"breastbone" pain with GERD flares.  I feel this is likely related to flareup of her GERD.  She could have complicating findings such as H. pylori.  Further GERD management as per above, recommend strict trigger avoidance.  Call with progress in 1 week and if no improvement can consider testing for H. pylori versus, eventually, she may need an EGD.  Follow-up in 2 months.

## 2018-11-18 NOTE — Assessment & Plan Note (Signed)
The patient is complaining of non-intractable nausea and vomiting.  This typically has been averaging about once a week.  It occurs when she is having a flare of her GERD symptoms.  She previously took Zofran which helped her when she was having nausea and vomiting before.  I will send in Zofran every 6 hours as needed.  Further management of GERD as per above.  Follow-up in 2 months.

## 2018-11-18 NOTE — Progress Notes (Signed)
Referring Provider: [facility] Primary Care Physician:  [facility] Primary GI:  Dr. Oneida Alar  NOTE: Service was provided via telemedicine and was requested by the patient due to COVID-19 pandemic.  Method of visit: Zoom  Patient Location: Home  Provider Location: Office  Reason for Phone Visit: Vomiting  The patient was consented to phone follow-up via telephone encounter including billing of the encounter (yes/no): Yes  Persons present on the phone encounter, with roles: Daughter  Total time (minutes) spent on medical discussion: 22 minutes  Chief Complaint  Patient presents with  . Gastroesophageal Reflux    f/u. C/o vomiting constantly, ? related to GERD or ulcer, pain constant at breast bone    HPI:   Brooke Eaton is a 43 y.o. female who presents for virtual visit regarding: Complains of vomiting.  The patient was last seen in our office 03/18/2017 for GERD, IBS diarrhea type, gastritis.  Noted chronic history of GERD.  Has been on various PPIs, Prevacid, and Nexium although Prevacid caused diarrhea.  Omeprazole did not help.  At one point she insisted no more GERD pills and noted previously she was able to control her heartburn with her diet.  Most recent EGD and colonoscopy in April 2018 with a Schatzki's ring (nonobstructing) status post dilation, mild gastritis without H. pylori.  Small bowel biopsies negative for celiac.  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  Colonoscopy essentially normal and random colon biopsies negative for microscopic colitis.  Noted diverticula, internal hemorrhoids.  At her last visit she noted stress was causing a lot of heartburn and requested to go back to Nexium.  Bowel movements improved but dependent on dietary intake.  Chronic abdominal tenderness since teenage years.  No other GI symptoms.  Recommended restart Nexium daily, call if persistent abdominal discomfort to consider trial of Bentyl, follow-up in 1 year.  No  further follow-up since then either by phone or office visit.  Today she states she's been having frequent vomiting and wonders if it's related to her GERD. Also with persistent/constant pain at her breastbone/epigastric area (points to this area on her visit). She's been having more reflux symptoms. Has a flare with fried foods, bacon. These never caused a problem for her before. Has been taking Nexium once daily. Not taking anything else. Denies other abdominal pain. Vomiting occurred last night and again last week; average once a week. Vomiting occurs when she has a flare of GERD. Denies URI and flu-like symptoms. Denies hematochezia, melena, fever, chills, unintentional weight loss. Denies chest pain, dyspnea, dizziness, lightheadedness, syncope, near syncope. Denies any other upper or lower GI symptoms.  Past Medical History:  Diagnosis Date  . Acid reflux   . Allergy   . Anxiety   . Anxiety and depression   . Asthma   . Depression   . Fibroid   . Fibromyalgia   . Heart murmur   . Irritable bowel syndrome   . Migraines   . Plantar fasciitis   . PONV (postoperative nausea and vomiting)   . Post-traumatic stress disorder   . PTSD (post-traumatic stress disorder)   . Ruptured cyst of ovary 2001  . Ruptured cyst of ovary   . Substance abuse [facility])     Past Surgical History:  Procedure Laterality Date  . ADENOIDECTOMY    . COLONOSCOPY N/A 11/07/2016   Procedure: COLONOSCOPY;  Surgeon: Danie Binder, MD;  [facility];  Service: Endoscopy;  Laterality: N/A;  1:30pm-rescheduled to  4/5 at 10:30am per Ginger  . DILATION AND CURETTAGE OF UTERUS    . DILITATION & CURRETTAGE/HYSTROSCOPY WITH NOVASURE ABLATION N/A 10/01/2016   Procedure: DILATATION & CURETTAGE/HYSTEROSCOPY WITH NOVASURE ABLATION AND EXCISION OF VULVAR/GLUTEAL  CYST;  Surgeon: Jonnie Kind, MD;  Location: [facility];  Service: Gynecology;  Laterality: N/A;  . ESOPHAGOGASTRODUODENOSCOPY N/A 11/07/2016   Procedure:  ESOPHAGOGASTRODUODENOSCOPY (EGD);  Surgeon: Danie Binder, MD;  [facility];  Service: Endoscopy;  Laterality: N/A;  . SAVORY DILATION N/A 11/07/2016   Procedure: SAVORY DILATION;  Surgeon: Danie Binder, MD;  [facility];  Service: Endoscopy;  Laterality: N/A;  . TONSILLECTOMY    . TUBAL LIGATION  2016  . WISDOM TOOTH EXTRACTION      Current Outpatient Medications  Medication Sig Dispense Refill  . albuterol (ACCUNEB) 0.63 MG/3ML nebulizer solution Take 1 ampule by nebulization as needed for wheezing.    . ALBUTEROL IN Inhale into the lungs as needed.    Marland Kitchen esomeprazole (NEXIUM) 40 MG capsule Take 40 mg by mouth daily at 12 noon.    . pregabalin (LYRICA) 75 MG capsule Take 75 mg by mouth daily.    . traZODone (DESYREL) 50 MG tablet Take 50 mg by mouth at bedtime.    . lidocaine (XYLOCAINE) 2 % solution Use as directed 15 mLs in the mouth or throat every 6 (six) hours as needed for mouth pain (heartburn flare/upper stomach pain). 100 mL 1  . ondansetron (ZOFRAN ODT) 4 MG disintegrating tablet Take 1 tablet (4 mg total) by mouth every 8 (eight) hours as needed for nausea or vomiting. 20 tablet 2   No current facility-administered medications for this visit.     Allergies as of 11/18/2018 - Review Complete 11/18/2018  Allergen Reaction Noted  . Doxycycline Nausea And Vomiting 10/27/2011  . Flagyl [metronidazole] Itching 01/17/2012  . Latex Itching 10/27/2011  . Penicillins Itching 10/27/2011    Family History  Problem Relation Age of Onset  . Emphysema Maternal Grandmother   . Gout Maternal Grandmother   . Diabetes Maternal Grandmother   . Gout Father   . Alcohol abuse Father   . Arthritis Father   . COPD Father   . Depression Father   . Drug abuse Father   . Hyperlipidemia Father   . Hypertension Father   . Gout Mother   . Arthritis Mother        RA  . Diabetes Mother   . Depression Mother   . Heart disease Mother   . Hyperlipidemia Mother   .  Hypertension Mother   . Vision loss Mother   . Thyroid disease Maternal Aunt   . Diabetes Maternal Uncle   . Breast cancer Other   . Breast cancer Paternal Aunt 78  . Breast cancer Paternal Aunt 70  . Colon cancer Neg Hx   . Inflammatory bowel disease Neg Hx   . Celiac disease Neg Hx     Social History   Socioeconomic History  . Marital status: Single    Spouse name: n/a  . Number of children: 1  . Years of education: 36  . Highest education level: Not on file  Occupational History  . Not on file  Social Needs  . Financial resource strain: Not on file  . Food insecurity:    Worry: Not on file    Inability: Not on file  . Transportation needs:    Medical: Not on file    Non-medical: Not  on file  Tobacco Use  . Smoking status: Never Smoker  . Smokeless tobacco: Never Used  Substance and Sexual Activity  . Alcohol use: Yes    Frequency: Never    Comment: WINE OCCASIONAL  . Drug use: Yes    Types: Marijuana    Comment: daily  . Sexual activity: Not Currently    Birth control/protection: Surgical    Comment: tubal and ablation  Lifestyle  . Physical activity:    Days per week: Not on file    Minutes per session: Not on file  . Stress: Not on file  Relationships  . Social connections:    Talks on phone: Not on file    Gets together: Not on file    Attends religious service: Not on file    Active member of club or organization: Not on file    Attends meetings of clubs or organizations: Not on file    Relationship status: Not on file  Other Topics Concern  . Not on file  Social History Narrative   Single parent   One daughter - almost 2    Review of Systems: Complete ROS negative except as per HPI.  Physical Exam: Note: limited exam due to virtual visit General:   Alert and oriented. Pleasant and cooperative. Well-nourished and well-developed.  Head:  Normocephalic and atraumatic. Eyes:  Without icterus, sclera clear and conjunctiva pink.  Ears:  Normal  auditory acuity. Skin:  Intact without facial significant lesions or rashes. Neurologic:  Alert and oriented x4;  grossly normal neurologically. Psych:  Alert and cooperative. Normal mood and affect. Heme/Lymph/Immune: No excessive bruising noted.

## 2018-11-18 NOTE — Assessment & Plan Note (Signed)
Chronic GERD currently on Nexium once a day.  She is had worsening reflux.  This tends to occur with fried foods like fried chicken as well as greasy foods such as bacon.  I discussed trigger avoidance and foods that may cause a problem with GERD.  She states this is never been a problem for her before.  Regardless, I recommended she avoid triggers to prevent flares.  I will increase her Nexium to twice a day.  I will send in viscous lidocaine as needed for breakthrough.  I requested she call us in 1 week and let us know if she has had any improvement.  Depending on her clinical progression we may need to consider testing for H. Pylori and, maybe eventually, EGD.  Follow-up in 2 months.

## 2018-11-18 NOTE — Patient Instructions (Signed)
Your health issues we discussed today were:   GERD (heartburn) with upper abdominal pain: 1. I feel the symptoms are likely flareups/worsening heartburn 2. Although they have not caused problems for you before, avoid trigger foods such as fried foods and greasy foods 3. Increase your Nexium to twice a day: For sing in the morning and 30 minutes before dinner 4. I have sent a prescription for viscous lidocaine solution to your pharmacy.  You can take this as needed for breakthrough heartburn 5. Call in 1 week and let us know if you are doing any better  Nausea and vomiting: 1. I feel this is likely related to your flareup of GERD/heartburn 2. I have sent in Zofran dissolving tablets to your pharmacy 3. You can use this up to every 6 hours, as needed for nausea or vomiting. 4. Call us for any severe or worsening symptoms  Overall I recommend:  1. Continue your current medications 2. Follow-up in 2 months 3. Call us if you have any questions or concerns   Because of recent events of COVID-19 ("Coronavirus"), follow CDC recommendations:  1. Wash your hand frequently 2. Avoid touching your face 3. Stay away from people who are sick 4. If you have symptoms such as fever, cough, shortness of breath then call your healthcare provider for further guidance 5. If you are sick, STAY AT HOME unless otherwise directed by your healthcare provider. 6. Follow directions from state and national officials regarding staying safe   At [facility] we value your feedback. You may receive a survey about your visit today. Please share your experience as we strive to create trusting relationships with our patients to provide genuine, compassionate, quality care.  We appreciate your understanding and patience as we review any laboratory studies, imaging, and other diagnostic tests that are ordered as we care for you. Our office policy is 5 business days for review of these results, and any  emergent or urgent results are addressed in a timely manner for your best interest. If you do not hear from our office in 1 week, please contact us.   We also encourage the use of MyChart, which contains your medical information for your review as well. If you are not enrolled in this feature, an access code is on this after visit summary for your convenience. Thank you for allowing [place] to be involved in your care.  It was great to see you today!  I hope you have a great day!!

## 2018-11-19 ENCOUNTER — Encounter: Payer: Self-pay | Admitting: Gastroenterology

## 2018-11-19 NOTE — Progress Notes (Signed)
CC'D TO PCP °

## 2018-12-14 ENCOUNTER — Encounter

## 2018-12-14 ENCOUNTER — Ambulatory Visit: Payer: Medicaid Other | Admitting: Gastroenterology

## 2019-01-05 ENCOUNTER — Ambulatory Visit: Payer: Medicaid Other | Admitting: Podiatry

## 2019-02-08 ENCOUNTER — Encounter: Payer: Self-pay | Admitting: Nurse Practitioner

## 2019-02-08 ENCOUNTER — Ambulatory Visit (INDEPENDENT_AMBULATORY_CARE_PROVIDER_SITE_OTHER): Payer: Medicaid Other | Admitting: Nurse Practitioner

## 2019-02-08 ENCOUNTER — Other Ambulatory Visit: Payer: Self-pay

## 2019-02-08 VITALS — BP 121/86 | HR 83 | Temp 98.1°F | Ht 66.0 in | Wt 212.8 lb

## 2019-02-08 DIAGNOSIS — R112 Nausea with vomiting, unspecified: Secondary | ICD-10-CM | POA: Diagnosis not present

## 2019-02-08 DIAGNOSIS — K219 Gastro-esophageal reflux disease without esophagitis: Secondary | ICD-10-CM | POA: Diagnosis not present

## 2019-02-08 DIAGNOSIS — R1013 Epigastric pain: Secondary | ICD-10-CM | POA: Diagnosis not present

## 2019-02-08 NOTE — Progress Notes (Signed)
Referring Provider: [facility] Primary Care Physician:  [facility] Primary GI:  Dr. Oneida Alar  Chief Complaint  Patient presents with  . Nausea    with vomiting  . Abdominal Pain    rigth upper under breast pain  . Diarrhea    HPI:   Brooke Eaton is a 43 y.o. female who presents to follow-up on GERD, nausea, vomiting.  The patient was last seen in our office 11/18/2018 for the same.  Previous visit was a virtual visit due to COVID-19/coronavirus pandemic.  Noted chronic history of GERD previously tried various PPIs including Prevacid, Nexium, omeprazole (Prevacid caused diarrhea).  Noted history of IBS diarrhea type.  Previous EGD April 2018 with Schatzki's ring status post dilation, mild gastritis without H. pylori and small bowel biopsies negative for celiac.  Also noted large duodenal diverticulum and ampulla.  Colonoscopy at the same time was essentially normal and random colon biopsies negative for microscopic colitis.  Has apparent stress trigger of GERD.  At her last visit she noted frequent vomiting and questions if this is related to GERD.  Also noted persistent/constant pain in her breastbone/epigastric area, more reflux symptoms and flares with typically fried foods and bacon which has never caused a problem before.  Taking Nexium once daily and nothing else.  Vomiting averaging about once a week and typically associated with a GERD flare.  No other GI complaints.  Recommended avoid all triggers, increase Nexium to twice a day, viscous lidocaine, Zofran for nausea/vomiting.  Requested progress report related to twice daily Nexium.  Follow-up in 2 months.  No progress report was called to our office.  Today she states she's not doing well. She states she's taking Nexium twice daily. Did call progress report because the increased Nexium worked well. The lidocaine made her nauseated and stopped taking it. She's not having esophageal burning/GERD symptoms. She ends up  getting upset stomach and vomiting. Isn't vomiting often, last night was her first episode of vomiting since April. She did not take any Zofran. Feels like her stomach "started up" after her smoothie, typical fruit mix that typically works for her. She did not try to take Zofran stating "I didn't wanna mess with it." No other identified triggers. Typically a smoothie helps her when she has an episode of vomiting. She did use a new "Oat Milk" for the first time in her smoothie (has used it in coffee without issue). Also with epigastric "knott" that hurts when she presses on it. Denies fever, chills, unintentional weight loss. Weight fluctuates but stable. Denies URI or flu-like symptoms. Denies loss of sense of taste or smell. Denies chest pain, dyspnea, dizziness, lightheadedness, syncope, near syncope. Denies any other upper or lower GI symptoms.  Past Medical History:  Diagnosis Date  . Acid reflux   . Allergy   . Anxiety   . Anxiety and depression   . Asthma   . Depression   . Fibroid   . Fibromyalgia   . Heart murmur   . Irritable bowel syndrome   . Migraines   . Plantar fasciitis   . PONV (postoperative nausea and vomiting)   . Post-traumatic stress disorder   . PTSD (post-traumatic stress disorder)   . Ruptured cyst of ovary 2001  . Ruptured cyst of ovary   . Substance abuse [facility])     Past Surgical History:  Procedure Laterality Date  . ADENOIDECTOMY    . COLONOSCOPY N/A 11/07/2016   Procedure: COLONOSCOPY;  Surgeon: Carlyon Prows  Rexene Edison, MD;  [facility];  Service: Endoscopy;  Laterality: N/A;  1:30pm-rescheduled to 4/5 at 10:30am per Ginger  . DILATION AND CURETTAGE OF UTERUS    . DILITATION & CURRETTAGE/HYSTROSCOPY WITH NOVASURE ABLATION N/A 10/01/2016   Procedure: DILATATION & CURETTAGE/HYSTEROSCOPY WITH NOVASURE ABLATION AND EXCISION OF VULVAR/GLUTEAL  CYST;  Surgeon: Jonnie Kind, MD;  Location: [facility];  Service: Gynecology;  Laterality: N/A;  .  ESOPHAGOGASTRODUODENOSCOPY N/A 11/07/2016   Procedure: ESOPHAGOGASTRODUODENOSCOPY (EGD);  Surgeon: Danie Binder, MD;  [facility];  Service: Endoscopy;  Laterality: N/A;  . SAVORY DILATION N/A 11/07/2016   Procedure: SAVORY DILATION;  Surgeon: Danie Binder, MD;  [facility];  Service: Endoscopy;  Laterality: N/A;  . TONSILLECTOMY    . TUBAL LIGATION  2016  . WISDOM TOOTH EXTRACTION      Current Outpatient Medications  Medication Sig Dispense Refill  . albuterol (ACCUNEB) 0.63 MG/3ML nebulizer solution Take 1 ampule by nebulization as needed for wheezing.    . ALBUTEROL IN Inhale into the lungs as needed.    Marland Kitchen esomeprazole (NEXIUM) 40 MG capsule Take 40 mg by mouth 2 (two) times daily before a meal.     . lidocaine (XYLOCAINE) 2 % solution Use as directed 15 mLs in the mouth or throat every 6 (six) hours as needed for mouth pain (heartburn flare/upper stomach pain). 100 mL 1  . ondansetron (ZOFRAN ODT) 4 MG disintegrating tablet Take 1 tablet (4 mg total) by mouth every 8 (eight) hours as needed for nausea or vomiting. 20 tablet 2  . pregabalin (LYRICA) 75 MG capsule Take 150 mg by mouth 3 (three) times daily.     . traZODone (DESYREL) 50 MG tablet Take 50 mg by mouth at bedtime.    . triamcinolone cream (KENALOG) 0.5 % As needed    . venlafaxine XR (EFFEXOR-XR) 150 MG 24 hr capsule Take 150 mg by mouth daily with breakfast.     No current facility-administered medications for this visit.     Allergies as of 02/08/2019 - Review Complete 02/08/2019  Allergen Reaction Noted  . Doxycycline Nausea And Vomiting 10/27/2011  . Flagyl [metronidazole] Itching 01/17/2012  . Latex Itching 10/27/2011  . Penicillins Itching 10/27/2011    Family History  Problem Relation Age of Onset  . Emphysema Maternal Grandmother   . Gout Maternal Grandmother   . Diabetes Maternal Grandmother   . Gout Father   . Alcohol abuse Father   . Arthritis Father   . COPD Father   .  Depression Father   . Drug abuse Father   . Hyperlipidemia Father   . Hypertension Father   . Gout Mother   . Arthritis Mother        RA  . Diabetes Mother   . Depression Mother   . Heart disease Mother   . Hyperlipidemia Mother   . Hypertension Mother   . Vision loss Mother   . Thyroid disease Maternal Aunt   . Diabetes Maternal Uncle   . Breast cancer Other   . Breast cancer Paternal Aunt 37  . Breast cancer Paternal Aunt 56  . Colon cancer Neg Hx   . Inflammatory bowel disease Neg Hx   . Celiac disease Neg Hx     Social History   Socioeconomic History  . Marital status: Single    Spouse name: n/a  . Number of children: 1  . Years of education: 63  . Highest education level:  Not on file  Occupational History  . Not on file  Social Needs  . Financial resource strain: Not on file  . Food insecurity    Worry: Not on file    Inability: Not on file  . Transportation needs    Medical: Not on file    Non-medical: Not on file  Tobacco Use  . Smoking status: Never Smoker  . Smokeless tobacco: Never Used  Substance and Sexual Activity  . Alcohol use: Yes    Frequency: Never    Comment: WINE OCCASIONAL  . Drug use: Yes    Types: Marijuana    Comment: daily  . Sexual activity: Not Currently    Birth control/protection: Surgical    Comment: tubal and ablation  Lifestyle  . Physical activity    Days per week: Not on file    Minutes per session: Not on file  . Stress: Not on file  Relationships  . Social Herbalist on phone: Not on file    Gets together: Not on file    Attends religious service: Not on file    Active member of club or organization: Not on file    Attends meetings of clubs or organizations: Not on file    Relationship status: Not on file  Other Topics Concern  . Not on file  Social History Narrative   Single parent   One daughter - almost 2    Review of Systems: Complete ROS negative except as per HPI.   Physical Exam: BP  121/86   Pulse 83   Temp 98.1 F (36.7 C) (Oral)   Ht 5\' 6"  (1.676 m)   Wt 212 lb 12.8 oz (96.5 kg)   BMI 34.35 kg/m  General:   Alert and oriented. Pleasant and cooperative. Well-nourished and well-developed.  Eyes:  Without icterus, sclera clear and conjunctiva pink.  Ears:  Normal auditory acuity. Cardiovascular:  S1, S2 present without murmurs appreciated.Extremities without clubbing or edema. Respiratory:  Clear to auscultation bilaterally. No wheezes, rales, or rhonchi. No distress.  Gastrointestinal:  +BS, soft, and non-distended. Moderate epigastric TTP noted. No HSM noted. No guarding or rebound. No masses appreciated.  Rectal:  Deferred  Musculoskalatal:  Symmetrical without gross deformities. Skin:  Intact without significant lesions or rashes. Neurologic:  Alert and oriented x4;  grossly normal neurologically. Psych:  Alert and cooperative. Normal mood and affect. Heme/Lymph/Immune: No excessive bruising noted.    02/08/2019 8:34 AM   Disclaimer: This note was dictated with voice recognition software. Similar sounding words can inadvertently be transcribed and may not be corrected upon review.

## 2019-02-08 NOTE — Patient Instructions (Signed)
Your health issues we discussed today were:   GERD (reflux/heartburn): 1. Continue taking Nexium twice a day as you have been 2. Call us if you have any worsening or severe symptoms  Abdominal pain, nausea, vomiting: 1. As we discussed, we will have a CT completed of your abdomen and pelvis to further evaluate 2. If you have nausea or vomiting, use Zofran to help prevent prolonged episode of nausea and vomiting 3. Continue your other medications including Nexium 4. Try switching back to the milk he previously used for smoothies as the new oat milk may have upset her stomach  Overall I recommend:  1. Continue your other current medications 2. Follow-up in 2 months 3. Call us if you have any questions or concerns.   Because of recent events of COVID-19 ("Coronavirus"), follow CDC recommendations:  1. Wash your hand frequently 2. Avoid touching your face 3. Stay away from people who are sick 4. If you have symptoms such as fever, cough, shortness of breath then call your healthcare provider for further guidance 5. If you are sick, STAY AT HOME unless otherwise directed by your healthcare provider. 6. Follow directions from state and national officials regarding staying safe   At [facility] we value your feedback. You may receive a survey about your visit today. Please share your experience as we strive to create trusting relationships with our patients to provide genuine, compassionate, quality care.  We appreciate your understanding and patience as we review any laboratory studies, imaging, and other diagnostic tests that are ordered as we care for you. Our office policy is 5 business days for review of these results, and any emergent or urgent results are addressed in a timely manner for your best interest. If you do not hear from our office in 1 week, please contact us.   We also encourage the use of MyChart, which contains your medical information for your review as  well. If you are not enrolled in this feature, an access code is on this after visit summary for your convenience. Thank you for allowing [place] to be involved in your care.  It was great to see you today!  I hope you have a great summer!!

## 2019-02-08 NOTE — Progress Notes (Signed)
CC'D TO PCP °

## 2019-02-08 NOTE — Assessment & Plan Note (Signed)
Persistent nausea and vomiting, though improved and less frequent with better controlled GERD.  Given her ongoing pain (see below) and intermittent nausea and vomiting we will plan for cross-sectional imaging that has not been previously completed the CT of her abdomen and pelvis.  Continue other medications.  Use Zofran for any nausea or vomiting.  Her episode of vomiting yesterday was preceded by a smoothie which she used a different type of days ("oat milk ").  Recommend she return to her previous milk as intolerance of milk may have precipitated her most recent exacerbation.  Follow-up in 2 months.

## 2019-02-08 NOTE — Assessment & Plan Note (Signed)
Chronic GERD which is previously not well controlled.  We increased her Nexium twice daily which she is taking.  States her GERD symptoms are improved.  She does still have epigastric abdominal pain and persistent intermittent nausea and vomiting.  Recommend she continue her current medications that seem to be working well.  Stop taking lidocaine that caused nausea.  Follow-up in 2 months.

## 2019-02-08 NOTE — Patient Instructions (Signed)
PA for CT abd/pelvis w/contrast submitted via [facility]. Case approved. PA# [id number], valid 02/08/19-08/07/19.

## 2019-02-08 NOTE — Assessment & Plan Note (Signed)
The patient complains of persistent epigastric type abdominal pain.  She wonders if this is related to her GERD, although other GERD symptoms have improved with PPI twice daily.  She is moderately tender on exam.  This is associated with persistent nausea and vomiting, most recently last night.  Given her symptoms we will proceed with a CT of the abdomen and pelvis which is not been completed recently.  Continue other medications and follow-up in 2 months.  She is to call for any worsening or severe symptoms.

## 2019-02-24 ENCOUNTER — Other Ambulatory Visit: Payer: Self-pay

## 2019-02-24 ENCOUNTER — Ambulatory Visit (HOSPITAL_COMMUNITY)
Admission: RE | Admit: 2019-02-24 | Discharge: 2019-02-24 | Disposition: A | Discharge status: Home / Self Care | Payer: Medicaid Other | Source: Ambulatory Visit | Attending: Nurse Practitioner | Admitting: Nurse Practitioner

## 2019-02-24 DIAGNOSIS — R112 Nausea with vomiting, unspecified: Secondary | ICD-10-CM | POA: Insufficient documentation

## 2019-02-24 DIAGNOSIS — K219 Gastro-esophageal reflux disease without esophagitis: Secondary | ICD-10-CM

## 2019-02-24 DIAGNOSIS — R1013 Epigastric pain: Secondary | ICD-10-CM

## 2019-02-24 MED ORDER — IOHEXOL 300 MG/ML  SOLN
100.0000 mL | Freq: Once | INTRAMUSCULAR | Status: AC | PRN
  Administered 2019-02-24: 100 mL via INTRAVENOUS

## 2019-03-23 DIAGNOSIS — Z029 Encounter for administrative examinations, unspecified: Secondary | ICD-10-CM

## 2019-03-31 DIAGNOSIS — M79676 Pain in unspecified toe(s): Secondary | ICD-10-CM

## 2019-04-13 ENCOUNTER — Encounter: Payer: Self-pay | Admitting: Nurse Practitioner

## 2019-04-13 ENCOUNTER — Ambulatory Visit (INDEPENDENT_AMBULATORY_CARE_PROVIDER_SITE_OTHER): Payer: Medicaid Other | Admitting: Nurse Practitioner

## 2019-04-13 ENCOUNTER — Other Ambulatory Visit: Payer: Self-pay

## 2019-04-13 VITALS — BP 122/90 | HR 77 | Temp 96.8°F | Ht 66.0 in | Wt 217.8 lb

## 2019-04-13 DIAGNOSIS — R112 Nausea with vomiting, unspecified: Secondary | ICD-10-CM | POA: Diagnosis not present

## 2019-04-13 DIAGNOSIS — R1013 Epigastric pain: Secondary | ICD-10-CM

## 2019-04-13 DIAGNOSIS — K219 Gastro-esophageal reflux disease without esophagitis: Secondary | ICD-10-CM | POA: Diagnosis not present

## 2019-04-13 MED ORDER — ONDANSETRON HCL 4 MG PO TABS: 4.0000 mg | ORAL_TABLET | Freq: Three times a day (TID) | ORAL | 3 refills | Status: DC | PRN

## 2019-04-13 MED ORDER — ESOMEPRAZOLE MAGNESIUM 40 MG PO CPDR: 40.0000 mg | DELAYED_RELEASE_CAPSULE | Freq: Two times a day (BID) | ORAL | 3 refills | Status: DC

## 2019-04-13 NOTE — Assessment & Plan Note (Signed)
Symptoms significantly improved.  If she does have breakthrough nausea Zofran works well for her.  She is requested a refill of Zofran for 90-day supply.  I explained that they may not cover for 90-day supply but I will attempt to send it in.  Otherwise continue other current medications and follow-up in 6 months.

## 2019-04-13 NOTE — Assessment & Plan Note (Signed)
Epigastric pain significantly improved/essentially resolved likely as a sequela of ongoing GERD.  Recommend she continue Nexium twice daily and Zofran as needed.  90-day supplies were sent to her pharmacy per her request.  Follow-up in 6 months.  Notify us of any severe or worsening symptoms.

## 2019-04-13 NOTE — Patient Instructions (Signed)
Your health issues we discussed today were:   GERD (reflux or heartburn) with associated nausea and abdominal pain: 1. As requested I have sent in 28-month supply prescriptions for Nexium and Zofran 2. Continue to take Nexium twice daily, 30 minutes before your first meal the day and 30 minutes for your last meal the day 3. You can use Zofran as needed for nausea 4. Call us if you have any severe or worsening symptoms  Overall I recommend:  1. Continue your other current medications 2. Return for follow-up in 6 months 3. Call us if you have any questions or concerns.   Because of recent events of COVID-19 ("Coronavirus"), follow CDC recommendations:  1. Wash your hand frequently 2. Avoid touching your face 3. Stay away from people who are sick 4. If you have symptoms such as fever, cough, shortness of breath then call your healthcare provider for further guidance 5. If you are sick, STAY AT HOME unless otherwise directed by your healthcare provider. 6. Follow directions from state and national officials regarding staying safe   At [facility] we value your feedback. You may receive a survey about your visit today. Please share your experience as we strive to create trusting relationships with our patients to provide genuine, compassionate, quality care.  We appreciate your understanding and patience as we review any laboratory studies, imaging, and other diagnostic tests that are ordered as we care for you. Our office policy is 5 business days for review of these results, and any emergent or urgent results are addressed in a timely manner for your best interest. If you do not hear from our office in 1 week, please contact us.   We also encourage the use of MyChart, which contains your medical information for your review as well. If you are not enrolled in this feature, an access code is on this after visit summary for your convenience. Thank you for allowing [place] to be  involved in your care.  It was great to see you today!  I hope you have a great Fall!!

## 2019-04-13 NOTE — Progress Notes (Signed)
Referring Provider: Pa[facility] Primary Care Physician:  Pa, [facility] Primary GI:  Dr. Oneida Alar  Chief Complaint  Patient presents with  . Gastroesophageal Reflux    wants 90 day supply of meds    HPI:   Brooke Eaton is a 43 y.o. female who presents for follow-up on GERD and abdominal pain.  The patient was last seen in our office 02/08/2019 for the same as well as non-intractable vomiting with nausea.  Chronic history of GERD previously tried various PPIs including Prevacid (caused diarrhea), Nexium, omeprazole.  Also noted history of IBS diarrhea denied.  Previous Schatzki's ring status post dilation in 2018.  Colonoscopy up-to-date.  Noted stress trigger of GERD.  Previously been put on Nexium once daily.  Recommended avoid all triggers and increase Nexium to twice a day due to symptomatic episodes.  Also recommended viscous lidocaine and Zofran as needed.  This is a previous visit will be requested a progress report, unfortunately was called.  At her last visit she stated she was taking Nexium twice a day, which helped.  Lidocaine made her nauseated so she stopped taking it.  Getting upset stomach with vomiting although not very often (the previous night was the first time since April) but did not take any Zofran.  Typically a smoothie will help when she has vomiting and she did try a different "oat milk" for the first time.  Noted epigastric knot that hurts when she presses on it.  No other GI complaints.  Commended continue Nexium, CT of the abdomen and pelvis, Zofran as needed, switch back to the milk previously used for her smoothies rather than the new bland oat milk.  Follow-up in 2 months.  CT abdomen and pelvis completed 02/24/2019 which found no findings to explain epigastric pain, nausea, vomiting.  When our office relayed results to her she noted she was doing well as long she uses 2% milk or lactose-free milk.  Today she states she's doing well overall. She switched milk  back to the kind she previously drank. Still on PPI. Denies abdominal pain, N/V, hematochezia, melena, fever, chills, unintentional weight loss. Denies URI or flu-like symptoms. Denies loss of sense of taste or smell. Denies chest pain, dyspnea, dizziness, lightheadedness, syncope, near syncope. Denies any other upper or lower GI symptoms.  Past Medical History:  Diagnosis Date  . Acid reflux   . Allergy   . Anxiety   . Anxiety and depression   . Asthma   . Depression   . Fibroid   . Fibromyalgia   . Heart murmur   . Irritable bowel syndrome   . Migraines   . Plantar fasciitis   . PONV (postoperative nausea and vomiting)   . Post-traumatic stress disorder   . PTSD (post-traumatic stress disorder)   . Ruptured cyst of ovary 2001  . Ruptured cyst of ovary   . Substance abuse [facility])     Past Surgical History:  Procedure Laterality Date  . ADENOIDECTOMY    . COLONOSCOPY N/A 11/07/2016   Procedure: COLONOSCOPY;  Surgeon: Danie Binder, MD;  Location: [facility];  Service: Endoscopy;  Laterality: N/A;  1:30pm-rescheduled to 4/5 at 10:30am per Ginger  . DILATION AND CURETTAGE OF UTERUS    . DILITATION & CURRETTAGE/HYSTROSCOPY WITH NOVASURE ABLATION N/A 10/01/2016   Procedure: DILATATION & CURETTAGE/HYSTEROSCOPY WITH NOVASURE ABLATION AND EXCISION OF VULVAR/GLUTEAL  CYST;  Surgeon: Jonnie Kind, MD;  Location: [facility];  Service: Gynecology;  Laterality: N/A;  .  ESOPHAGOGASTRODUODENOSCOPY N/A 11/07/2016   Procedure: ESOPHAGOGASTRODUODENOSCOPY (EGD);  Surgeon: Danie Binder, MD;  Location: [facility];  Service: Endoscopy;  Laterality: N/A;  . SAVORY DILATION N/A 11/07/2016   Procedure: SAVORY DILATION;  Surgeon: Danie Binder, MD;  Location: [facility];  Service: Endoscopy;  Laterality: N/A;  . TONSILLECTOMY    . TUBAL LIGATION  2016  . WISDOM TOOTH EXTRACTION      Current Outpatient Medications  Medication Sig Dispense Refill  . albuterol (ACCUNEB) 0.63 MG/3ML nebulizer  solution Take 1 ampule by nebulization as needed for wheezing.    . ALBUTEROL IN Inhale into the lungs as needed.    Marland Kitchen esomeprazole (NEXIUM) 40 MG capsule Take 40 mg by mouth 2 (two) times daily before a meal.     . lidocaine (XYLOCAINE) 2 % solution Use as directed 15 mLs in the mouth or throat every 6 (six) hours as needed for mouth pain (heartburn flare/upper stomach pain). 100 mL 1  . ondansetron (ZOFRAN ODT) 4 MG disintegrating tablet Take 1 tablet (4 mg total) by mouth every 8 (eight) hours as needed for nausea or vomiting. 20 tablet 2  . pregabalin (LYRICA) 75 MG capsule Take 150 mg by mouth 3 (three) times daily.     . traZODone (DESYREL) 50 MG tablet Take 50 mg by mouth at bedtime.    . triamcinolone cream (KENALOG) 0.5 % As needed    . venlafaxine XR (EFFEXOR-XR) 150 MG 24 hr capsule Take 150 mg by mouth daily with breakfast.     No current facility-administered medications for this visit.     Allergies as of 04/13/2019 - Review Complete 04/13/2019  Allergen Reaction Noted  . Doxycycline Nausea And Vomiting 10/27/2011  . Flagyl [metronidazole] Itching 01/17/2012  . Latex Itching 10/27/2011  . Penicillins Itching 10/27/2011    Family History  Problem Relation Age of Onset  . Emphysema Maternal Grandmother   . Gout Maternal Grandmother   . Diabetes Maternal Grandmother   . Gout Father   . Alcohol abuse Father   . Arthritis Father   . COPD Father   . Depression Father   . Drug abuse Father   . Hyperlipidemia Father   . Hypertension Father   . Gout Mother   . Arthritis Mother        RA  . Diabetes Mother   . Depression Mother   . Heart disease Mother   . Hyperlipidemia Mother   . Hypertension Mother   . Vision loss Mother   . Thyroid disease Maternal Aunt   . Diabetes Maternal Uncle   . Breast cancer Other   . Breast cancer Paternal Aunt 39  . Breast cancer Paternal Aunt 56  . Colon cancer Neg Hx   . Inflammatory bowel disease Neg Hx   . Celiac disease Neg Hx      Social History   Socioeconomic History  . Marital status: Single    Spouse name: n/a  . Number of children: 1  . Years of education: 53  . Highest education level: Not on file  Occupational History  . Not on file  Social Needs  . Financial resource strain: Not on file  . Food insecurity    Worry: Not on file    Inability: Not on file  . Transportation needs    Medical: Not on file    Non-medical: Not on file  Tobacco Use  . Smoking status: Never Smoker  . Smokeless tobacco: Never Used  Substance and Sexual Activity  . Alcohol use: Yes    Frequency: Never    Comment: WINE OCCASIONAL  . Drug use: Yes    Types: Marijuana    Comment: daily  . Sexual activity: Not Currently    Birth control/protection: Surgical    Comment: tubal and ablation  Lifestyle  . Physical activity    Days per week: Not on file    Minutes per session: Not on file  . Stress: Not on file  Relationships  . Social Herbalist on phone: Not on file    Gets together: Not on file    Attends religious service: Not on file    Active member of club or organization: Not on file    Attends meetings of clubs or organizations: Not on file    Relationship status: Not on file  Other Topics Concern  . Not on file  Social History Narrative   Single parent   One daughter - almost 2    Review of Systems: Complete ROS negative except as per HPI.   Physical Exam: BP 122/90   Pulse 77   Temp (!) 96.8 F (36 C) (Temporal)   Ht 5\' 6"  (1.676 m)   Wt 217 lb 12.8 oz (98.8 kg)   BMI 35.15 kg/m  General:   Alert and oriented. Pleasant and cooperative. Well-nourished and well-developed.  Eyes:  Without icterus, sclera clear and conjunctiva pink.  Ears:  Normal auditory acuity. Cardiovascular:  S1, S2 present without murmurs appreciated. Extremities without clubbing or edema. Respiratory:  Clear to auscultation bilaterally. No wheezes, rales, or rhonchi. No distress.  Gastrointestinal:  +BS,  soft, non-tender and non-distended. No HSM noted. No guarding or rebound. No masses appreciated.  Rectal:  Deferred  Musculoskalatal:  Symmetrical without gross deformities.  Neurologic:  Alert and oriented x4;  grossly normal neurologically. Psych:  Alert and cooperative. Normal mood and affect. Heme/Lymph/Immune: No excessive bruising noted.    04/13/2019 10:46 AM   Disclaimer: This note was dictated with voice recognition software. Similar sounding words can inadvertently be transcribed and may not be corrected upon review.

## 2019-04-13 NOTE — Assessment & Plan Note (Signed)
GERD symptoms significantly improved/essentially resolved on Nexium twice daily.  Recommend she continue Nexium twice daily.  A 90-day supply was sent to her pharmacy per her request.  Follow-up in 6 months and notify us of any worsening or severe symptoms before then.

## 2019-05-10 ENCOUNTER — Ambulatory Visit (INDEPENDENT_AMBULATORY_CARE_PROVIDER_SITE_OTHER): Payer: Medicaid Other

## 2019-05-10 ENCOUNTER — Other Ambulatory Visit: Payer: Self-pay

## 2019-05-10 ENCOUNTER — Encounter: Payer: Self-pay | Admitting: Podiatry

## 2019-05-10 ENCOUNTER — Ambulatory Visit: Payer: Medicaid Other | Admitting: Podiatry

## 2019-05-10 DIAGNOSIS — M7672 Peroneal tendinitis, left leg: Secondary | ICD-10-CM

## 2019-05-10 DIAGNOSIS — M7671 Peroneal tendinitis, right leg: Secondary | ICD-10-CM

## 2019-05-10 DIAGNOSIS — G5762 Lesion of plantar nerve, left lower limb: Secondary | ICD-10-CM | POA: Diagnosis not present

## 2019-05-10 DIAGNOSIS — M79672 Pain in left foot: Secondary | ICD-10-CM

## 2019-05-10 NOTE — Patient Instructions (Signed)
Peroneal Tendon Tear  The peroneal tendons are strong bands of tissue that are on the outside of your ankle. They connect a bone in your foot to the muscles that let you turn your feet outward and stand on your toes. A peroneal tendon tear (rupture) can include a partial or complete tear of the tendon. This injury can interfere with your ability to straighten your foot or turn your foot to the outside. What are the causes? This condition may be caused by a long-term injury, or it may occur suddenly. This injury may be caused by:  Doing repeated ankle movements.  Placing sudden stress on the peroneal tendons, which stretches them farther or faster than they are meant to stretch.  Having certain medical problems.  Taking certain medicines for a long time. What increases the risk? The following factors may make you more likely to develop this condition:  Participating in activities that: ? Require sudden and quick tightening (contractions) of the muscles of the lower leg. These include jumping and quick starts. ? Involve kicking with the outer part of the foot, such as martial arts.  Having a weakened tendon from: ? A misshapen groove in the bottom of the heel bone where the peroneal tendons sit. ? Poor foot strength and flexibility. ? A past ankle sprain. ? A past or untreated peroneal tendon injury. ? A medical problem that decreases blood supply to the peroneal tendons.  Failing to warm up properly before activities.  Having shots to treat swelling and inflammation (corticosteroid injections) in or near the peroneal tendon.  Using certain antibiotics. What are the signs or symptoms? Symptoms of this condition include:  A snapping or popping sound behind the outer part of your ankle. You may also have a feeling that something is torn in your ankle.  Pain when moving your foot up and down.  A clicking feeling when moving your foot up and down.  Inability to stand on the toes  or the ball of your foot, or weakness when trying to stand on the toes or the ball of the foot.  Pain, swelling, and bruising around the injured area. How is this diagnosed? This condition may be diagnosed based on:  Your medical history.  Your symptoms.  A physical exam.  Imaging studies. These may include X-rays, an MRI, or an ultrasound. How is this treated? This condition may be treated with:  Applying ice, resting, and taking medicines.  Wearing a splint or a brace to support your ankle and keep it still.  Having surgery. This is done in severe cases.  Doing physical therapy to increase your strength and flexibility. Follow these instructions at home: If you have a splint or brace:  Wear it as told by your health care provider. Remove it only as told by your health care provider.  Loosen it f your toes tingle, become numb, or turn cold and blue.  Check the skin around it every day. Tell your health care provider about any concerns.  Keep it clean and dry.  If the splint or brace is not waterproof: ? Do not let it get wet. ? Cover it with a watertight covering when you take a bath or shower. Managing pain, stiffness, and swelling   If directed, put ice on the injured area. ? If you have a removable splint or brace, remove it as told by your health care provider. ? Put ice in a plastic bag. ? Place a towel between your skin and the  bag. ? Leave the ice on for 20 minutes, 2-3 times a day.  Move your toes often to reduce stiffness and swelling.  Raise (elevate) the injured area above the level of your heart while you are sitting or lying down. Activity  Do not use the injured ankle to support your body weight until your health care provider says that you can. Use crutches as told by your health care provider.  Return to your normal activities as told by your health care provider. Ask your health care provider what activities are safe for you.  Ask your health  care provider when it is safe to drive if you have a splint or brace on your ankle. General instructions  Take over-the-counter and prescription medicines only as told by your health care provider.  Do not use any products that contain nicotine or tobacco, such as cigarettes, e-cigarettes, and chewing tobacco. These can delay healing. If you need help quitting, ask your health care provider.  Keep all follow-up visits as told by your health care provider. This is important. How is this prevented?  Warm up and stretch before being active.  Cool down and stretch after being active.  Give your body time to rest between periods of activity.  Maintain physical fitness, including strength and flexibility. Contact a health care provider if:  Your pain gets worse even after treatment.  Your foot or ankle feels numb or cold, or looks blue. Summary  A peroneal tendon tear (rupture) can include a partial or complete tear to the tendons that are on the outside of your ankle.  Symptoms include a sound like a snap or a pop behind the outer part of your ankle, or a feeling that something is torn in your ankle. You may also have pain, swelling, and bruising of your ankle.  This condition is treated with rest, ice, medicines, and surgery.  Contact a health care provider if your pain gets worse, or your ankle feels numb or cold, or looks blue. This information is not intended to replace advice given to you by your health care provider. Make sure you discuss any questions you have with your health care provider. Document Released: 07/22/2005 Document Revised: 09/24/2018 Document Reviewed: 09/24/2018 Elsevier Patient Education  [place]retson Neuralgia  [place] Lathe neuralgia is foot pain that affects the ball of the foot and the area near the toes. Morton neuralgia occurs when part of a nerve in the foot (digital nerve) is under too much pressure (compressed). When this happens over a long  period of time, the nerve can thicken (neuroma) and cause pain. Pain usually occurs between the third and fourth toes.  Morton neuralgia can come and go but may get worse over time. What are the causes? This condition is caused by doing the same things over and over with your foot, such as:  Activities such as running or jumping.  Wearing shoes that are too tight. What increases the risk? You may be at higher risk for Morton neuralgia if you:  Are female.  Wear high heels.  Wear shoes that are narrow or tight.  Do activities that repeatedly stretch your toes, such as: ? Running. ? [place]. ? Long-distance walking. What are the signs or symptoms? The first symptom of Morton neuralgia is pain that spreads from the ball of the foot to the toes. It may feel like you are walking on a marble. Pain usually gets worse with walking and goes away at night. Other symptoms  may include numbness and cramping of your toes. Both feet are equally affected, but rarely at the same time. How is this diagnosed? This condition is diagnosed based on your symptoms, your medical history, and a physical exam. Your health care provider may:  Squeeze your foot just behind your toe.  Ask you to move your toes to check for pain.  Ask about your physical activity level. You also may have imaging tests, such as an X-ray, ultrasound, or MRI. How is this treated? Treatment depends on how severe your condition is and what causes it. Treatment may involve:  Wearing different shoes that are not too tight, are low-heeled, and provide good support. For some people, this is the only treatment needed.  Wearing an over-the-counter or custom supportive pad (orthotic) under the front of your foot.  Getting injections of numbing medicine and anti-inflammatory medicine (steroid) in the nerve.  Having surgery to remove part of the thickened nerve. Follow these instructions at home: Managing pain, stiffness, and swelling    Massage your foot as needed.  Wear orthotics as told by your health care provider.  If directed, put ice on your foot: ? Put ice in a plastic bag. ? Place a towel between your skin and the bag. ? Leave the ice on for 20 minutes, 2-3 times a day.  Avoid activities that cause pain or make pain worse. If you play sports, ask your health care provider when it is safe for you to return to sports.  Raise (elevate) your foot above the level of your heart while lying down and, when possible, while sitting. General instructions  Take over-the-counter and prescription medicines only as told by your health care provider.  Do not drive or use heavy machinery while taking prescription pain medicine.  Wear shoes that: ? Have soft soles. ? Have a wide toe area. ? Provide arch support. ? Do not pinch or squeeze your feet. ? Have room for your orthotics, if applicable.  Keep all follow-up visits as told by your health care provider. This is important. Contact a health care provider if:  Your symptoms get worse or do not get better with treatment and home care. Summary  Morton neuralgia is foot pain that affects the ball of the foot and the area near the toes. Pain usually occurs between the third and fourth toes, gets worse with walking, and goes away at night.  Morton neuralgia occurs when part of a nerve in the foot (digital nerve) is under too much pressure. When this happens over a long period of time, the nerve can thicken (neuroma) and cause pain.  This condition is caused by doing the same things over and over with your foot, such as running or jumping, wearing shoes that are too tight, or wearing high heels.  Treatment may involve wearing low-heeled shoes that are not too tight, wearing a supportive pad (orthotic) under the front of your foot, getting injections in the nerve, or having surgery to remove part of the thickened nerve. This information is not intended to replace advice  given to you by your health care provider. Make sure you discuss any questions you have with your health care provider. Document Released: 10/28/2000 Document Revised: 08/05/2017 Document Reviewed: 08/05/2017 Elsevier Patient Education  2020 [employer].

## 2019-05-10 NOTE — Progress Notes (Signed)
Subjective:  Patient ID: Brooke Eaton, female    DOB: 1975/11/23,  MRN: [id number]  Chief Complaint  Patient presents with  . Foot Pain    pt is here for foot pain in the left foot, pt also states that it is possible plantar fasciitis    43 y.o. female presents with the above complaint. Hx confirmed with patient.  She states that she has pain on the outside of her left foot and as well as in the fourth interspace.  She states that the pain has subsided with any conservative therapy.  She has been trying to stay off of it but it has not really helped and is worse when she ambulates.  Review of Systems: Negative except as noted in the HPI. Denies N/V/F/Ch.  Past Medical History:  Diagnosis Date  . Acid reflux   . Allergy   . Anxiety   . Anxiety and depression   . Asthma   . Depression   . Fibroid   . Fibromyalgia   . Heart murmur   . Irritable bowel syndrome   . Migraines   . Plantar fasciitis   . PONV (postoperative nausea and vomiting)   . Post-traumatic stress disorder   . PTSD (post-traumatic stress disorder)   . Ruptured cyst of ovary 2001  . Ruptured cyst of ovary   . Substance abuse ([place])     Current Outpatient Medications:  .  albuterol (ACCUNEB) 0.63 MG/3ML nebulizer solution, Take 1 ampule by nebulization as needed for wheezing., Disp: , Rfl:  .  ALBUTEROL IN, Inhale into the lungs as needed., Disp: , Rfl:  .  celecoxib (CELEBREX) 100 MG capsule, Take by mouth., Disp: , Rfl:  .  DULoxetine (CYMBALTA) 20 MG capsule, Take by mouth., Disp: , Rfl:  .  esomeprazole (NEXIUM) 40 MG capsule, Take 1 capsule (40 mg total) by mouth 2 (two) times daily before a meal., Disp: 180 capsule, Rfl: 3 .  gabapentin (NEURONTIN) 100 MG capsule, Take by mouth., Disp: , Rfl:  .  lidocaine (XYLOCAINE) 2 % solution, Use as directed 15 mLs in the mouth or throat every 6 (six) hours as needed for mouth pain (heartburn flare/upper stomach pain)., Disp: 100 mL, Rfl: 1 .  naproxen (NAPROSYN) 500  MG tablet, , Disp: , Rfl:  .  ondansetron (ZOFRAN ODT) 4 MG disintegrating tablet, Take 1 tablet (4 mg total) by mouth every 8 (eight) hours as needed for nausea or vomiting., Disp: 20 tablet, Rfl: 2 .  ondansetron (ZOFRAN) 4 MG tablet, Take 1 tablet (4 mg total) by mouth every 8 (eight) hours as needed for nausea or vomiting., Disp: 90 tablet, Rfl: 3 .  pregabalin (LYRICA) 75 MG capsule, Take 150 mg by mouth 3 (three) times daily. , Disp: , Rfl:  .  tiZANidine (ZANAFLEX) 4 MG tablet, , Disp: , Rfl:  .  traZODone (DESYREL) 50 MG tablet, Take 50 mg by mouth at bedtime., Disp: , Rfl:  .  triamcinolone cream (KENALOG) 0.5 %, As needed, Disp: , Rfl:  .  trolamine salicylate (ASPERCREME) 10 % cream, Apply to painful areas up to 3x/day as needed for pain, Disp: , Rfl:  .  venlafaxine XR (EFFEXOR-XR) 150 MG 24 hr capsule, Take 150 mg by mouth daily with breakfast., Disp: , Rfl:  .  Vitamin D, Ergocalciferol, (DRISDOL) 1.25 MG (50000 UT) CAPS capsule, , Disp: , Rfl:   Social History   Tobacco Use  Smoking Status Never Smoker  Smokeless Tobacco Never Used  Allergies  Allergen Reactions  . Doxycycline Nausea And Vomiting    Cannot tolerate oral doxycycline   . Flagyl [Metronidazole] Itching  . Latex Itching  . Penicillins Itching    Has patient had a PCN reaction causing immediate rash, facial/tongue/throat swelling, SOB or lightheadedness with hypotension: NO Has patient had a PCN reaction causing severe rash involving mucus membranes or skin necrosis: No Has patient had a PCN reaction that required hospitalization No Has patient had a PCN reaction occurring within the last 10 years: No If all of the above answers are "NO", then may proceed with Cephalosporin use.    Objective:  There were no vitals filed for this visit. There is no height or weight on file to calculate BMI. Constitutional no acute distress, alert/oriented x3 and appropriate mood and affect  Vascular Dorsalis pedis  pulse: present Posterior tibial pulse: present and  Hair pattern: normal Warmth: no warmth  Neurologic  fourth interspace Mulder click noted of the left foot. pain on lateral squeeze of both the metatarsal head.  Negative Tinel sign of the tarsal tunnel.  Dermatologic Skin normal Nails normal nails without lesions  Orthopedic: Swelling: none and minimal Tenderness: severe at the left peroneal tendon insertion on the fifth metatarsal base Deformity: Bilateral flatfoot deformity noted moderate in nature. Examination of the feet reveals warm, good capillary refill.    Assessment:   1. Peroneal tendinitis, left   2. Plantar neuroma of left foot   3. Pain in left foot    Plan:  Patient was evaluated and treated and all questions answered.  Left foot peroneal tendinitis at the insertion of the fifth metatarsal base -A steroid injection consisting of 1 cc of Kenalog 10, half cc of half percent Marcaine plain, 1/2 cc of 1% lidocaine plain was injected around the tendon not within the tendon.  No complications noted -Cam boot was dispensed -Left foot plantar neuroma of the fourth interspace -Nerve block consisting of 1 cc of half percent Marcaine plain and 1 cc of 1% lidocaine plain was injected near the fourth interspace at the head of fourth and fifth metatarsal.  No complications noted.   Return in about 4 weeks (around 06/07/2019).

## 2019-05-11 ENCOUNTER — Ambulatory Visit: Payer: Medicaid Other | Admitting: Podiatry

## 2019-05-26 ENCOUNTER — Encounter: Payer: Self-pay | Admitting: Obstetrics and Gynecology

## 2019-05-26 ENCOUNTER — Other Ambulatory Visit: Payer: Self-pay

## 2019-05-26 ENCOUNTER — Ambulatory Visit (INDEPENDENT_AMBULATORY_CARE_PROVIDER_SITE_OTHER): Payer: Medicaid Other | Admitting: Obstetrics and Gynecology

## 2019-05-26 VITALS — BP 115/82 | HR 82 | Ht 66.0 in | Wt 217.0 lb

## 2019-05-26 DIAGNOSIS — N911 Secondary amenorrhea: Secondary | ICD-10-CM | POA: Diagnosis not present

## 2019-05-26 MED ORDER — MEDROXYPROGESTERONE ACETATE 10 MG PO TABS: 10.0000 mg | ORAL_TABLET | Freq: Every day | ORAL | 0 refills | Status: DC

## 2019-05-26 NOTE — Patient Instructions (Signed)
Secondary Amenorrhea  Secondary amenorrhea occurs when a female who was previously having menstrual periods has not had them for 3-6 months. A menstrual period is the monthly shedding of the lining of the uterus. Menstruation involves the passing of blood, tissue, fluid, and mucus through the vagina. The flow of blood usually occurs during 3-7 consecutive days each month. This condition has many causes. In many cases, treating the underlying cause will return menstrual periods back to a normal cycle. What are the causes? The most common cause of this condition is pregnancy. Other causes include:  Malnutrition.  Cirrhosis of the liver.  Conditions of the blood.  Diabetes.  Epilepsy.  Chronic kidney disease.  Polycystic ovary disease.  Stress or anxiety.  A hormonal imbalance.  Ovarian failure.  Medicines.  Extreme obesity.  Cystic fibrosis.  Low body weight or drastic weight loss.  Early menopause.  Removal of the ovaries or uterus.  Contraceptive pills, patches, or vaginal rings.  Cushing syndrome.  Thyroid problems. What increases the risk? You are more likely to develop this condition if:  You have a family history of this condition.  You have an eating disorder.  You do extreme athletic training.  You have a chronic disease.  You abuse substances such as alcohol or cigarettes. What are the signs or symptoms? The main symptom of this condition is a lack of menstrual periods for 3-6 months. How is this diagnosed? This condition may be diagnosed based on:  Your medical history.  A physical exam.  A pelvic exam to check for problems with your reproductive organs.  A procedure to examine the uterus.  A measurement of your body mass index (BMI).  Tests, such as: ? Blood tests that measure certain hormones in your body and rule out pregnancy. ? Urine tests. ? Imaging tests, such as an ultrasound, CT scan, or MRI. How is this treated? Treatment  for this condition depends on the cause of the amenorrhea. It may involve:  Correcting dietary problems.  Treating underlying conditions.  Medicines.  Lifestyle changes.  Surgery. If the condition cannot be corrected, it is sometimes possible to trigger menstrual periods with medicines. Follow these instructions at home: Lifestyle  Maintain a healthy diet. Ask to meet with a registered dietitian for nutrition counseling and meal planning.  Maintain a healthy weight. Talk to your health care provider before trying any new diet or exercise plan.  Exercise at least 30 minutes 5 or more days each week. Exercising includes brisk walking, yard work, biking, running, swimming, and team sports like basketball and soccer. Ask your health care provider which exercises are safe for you.  Get enough sleep. Plan your sleep time to allow for 7-9 hours of sleep each night.  Learn to manage stress. Explore relaxation techniques such as meditation, journaling, yoga, or tai chi. General instructions  Be aware of changes in your menstrual cycle. Keep a record of when you have your menstrual period. Note the date your period starts, how long it lasts, and any problems you experience.  Take over-the-counter and prescription medicines only as told by your health care provider.  Keep all follow-up visits as told by your health care provider. This is important. Contact a health care provider if:  Your periods do not return to normal after treatment. Summary  Secondary amenorrhea is when a female who was previously having menstrual periods has not gotten her period for 3-6 months.  This condition has many causes. In many cases, treating the underlying   cause will return menstrual periods back to a normal cycle.  Talk to your health care provider if your periods do not return to normal after treatment. This information is not intended to replace advice given to you by your health care provider. Make  sure you discuss any questions you have with your health care provider. Document Released: 09/02/2006 Document Revised: 01/01/2018 Document Reviewed: 10/10/2016 Elsevier Patient Education  2020 Brooke Eaton have received a prescription for 10 days of Provera to cause you to have a menstrual period.  Normally that happens approximately 2 to 3 days after you complete the 10 days of medication.  Occasionally they will begin to have bleeding before the end of the 10 days.  If you have completed 7 or more days of the Provera before you begin to have a full-blown.  You may discontinue the Provera and allow the menstrual period to develop.

## 2019-05-26 NOTE — Progress Notes (Signed)
Patient ID: Brooke Eaton, female   DOB: [birth year], 43 y.o.   MRN: [id number]   [facility] Visit  @DATE @            Patient name: Brooke Eaton MRN [id number]  Date of birth: 09-24-1975  CC & HPI:  Brooke Eaton is a 43 y.o. female presenting today for irregular bleeding.She hadn't a period in 5-6 months and now has some pink spotting. Is not on any birth control, has had spotting twice this moth and twice last month. Has issues with cysts in the past which would cause her to have a discharge and spotting. 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   ROS:  ROS   Pertinent History Reviewed:   Reviewed: Significant for ablation Medical         Past Medical History:  Diagnosis Date  . Acid reflux   . Allergy   . Anxiety   . Anxiety and depression   . Asthma   . Depression   . Fibroid   . Fibromyalgia   . Heart murmur   . Irritable bowel syndrome   . Migraines   . Plantar fasciitis   . PONV (postoperative nausea and vomiting)   . Post-traumatic stress disorder   . PTSD (post-traumatic stress disorder)   . Ruptured cyst of ovary 2001  . Ruptured cyst of ovary   . Substance abuse [facility])                               Surgical Hx:    Past Surgical History:  Procedure Laterality Date  . ADENOIDECTOMY    . COLONOSCOPY N/A 11/07/2016   Procedure: COLONOSCOPY;  Surgeon: Danie Binder, MD;  Location: [facility];  Service: Endoscopy;  Laterality: N/A;  1:30pm-rescheduled to 4/5 at 10:30am per [place]  . DILATION AND CURETTAGE OF UTERUS    . DILITATION & CURRETTAGE/HYSTROSCOPY WITH NOVASURE ABLATION N/A 10/01/2016   Procedure: DILATATION & CURETTAGE/HYSTEROSCOPY WITH NOVASURE ABLATION AND EXCISION OF VULVAR/GLUTEAL  CYST;  Surgeon: Jonnie Kind, MD;  Location: [facility] OR[facility];  Service: Gynecology;  Laterality: N/A;  . ESOPHAGOGASTRODUODENOSCOPY N/A 11/07/2016   Procedure: ESOPHAGOGASTRODUODENOSCOPY (EGD);  Surgeon: Danie Binder, MD;   Location: [facility];  Service: Endoscopy;  Laterality: N/A;  . SAVORY DILATION N/A 11/07/2016   Procedure: SAVORY DILATION;  Surgeon: Danie Binder, MD;  Location: [facility];  Service: Endoscopy;  Laterality: N/A;  . TONSILLECTOMY    . TUBAL LIGATION  2016  . WISDOM TOOTH EXTRACTION     Medications: Reviewed & Updated - see associated section                       Current Outpatient Medications:  .  albuterol (ACCUNEB) 0.63 MG/3ML nebulizer solution, Take 1 ampule by nebulization as needed for wheezing., Disp: , Rfl:  .  ALBUTEROL IN, Inhale into the lungs as needed., Disp: , Rfl:  .  celecoxib (CELEBREX) 100 MG capsule, Take by mouth., Disp: , Rfl:  .  DULoxetine (CYMBALTA) 20 MG capsule, Take by mouth., Disp: , Rfl:  .  esomeprazole (NEXIUM) 40 MG capsule, Take 1 capsule (40 mg total) by mouth 2 (two) times daily before a meal., Disp: 180 capsule, Rfl: 3 .  gabapentin (NEURONTIN) 100 MG capsule, Take by mouth., Disp: , Rfl:  .  lidocaine (  XYLOCAINE) 2 % solution, Use as directed 15 mLs in the mouth or throat every 6 (six) hours as needed for mouth pain (heartburn flare/upper stomach pain)., Disp: 100 mL, Rfl: 1 .  naproxen (NAPROSYN) 500 MG tablet, , Disp: , Rfl:  .  ondansetron (ZOFRAN ODT) 4 MG disintegrating tablet, Take 1 tablet (4 mg total) by mouth every 8 (eight) hours as needed for nausea or vomiting., Disp: 20 tablet, Rfl: 2 .  ondansetron (ZOFRAN) 4 MG tablet, Take 1 tablet (4 mg total) by mouth every 8 (eight) hours as needed for nausea or vomiting., Disp: 90 tablet, Rfl: 3 .  pregabalin (LYRICA) 75 MG capsule, Take 150 mg by mouth 3 (three) times daily. , Disp: , Rfl:  .  tiZANidine (ZANAFLEX) 4 MG tablet, , Disp: , Rfl:  .  traZODone (DESYREL) 50 MG tablet, Take 50 mg by mouth at bedtime., Disp: , Rfl:  .  triamcinolone cream (KENALOG) 0.5 %, As needed, Disp: , Rfl:  .  trolamine salicylate (ASPERCREME) 10 % cream, Apply to painful areas up to 3x/day as needed for pain,  Disp: , Rfl:  .  venlafaxine XR (EFFEXOR-XR) 150 MG 24 hr capsule, Take 150 mg by mouth daily with breakfast., Disp: , Rfl:  .  Vitamin D, Ergocalciferol, (DRISDOL) 1.25 MG (50000 UT) CAPS capsule, , Disp: , Rfl:    Social History: Reviewed -  reports that she has never smoked. She has never used smokeless tobacco.  Objective Findings:  Vitals: There were no vitals taken for this visit.  PHYSICAL EXAMINATION General appearance - alert, well appearing, and in no distress Mental status - alert, oriented to person, place, and time, normal mood, behavior, speech, dress, motor activity, and thought processes, affect appropriate to mood  PELVIC Vagina -normal whitish mucous no purulence,  Cervix - normal, some small dark old blood Uterus - anterior speculum bimanual non-tender   Assessment & Plan:   A:  1.  Metrorrhagia  P:  1.  Provera x10 days 2. F/u televisit   By signing my name below, I, [name], attest that this documentation has been prepared under the direction and in the presence of Jonnie Kind, MD. Electronically Signed: [place]. 05/26/19. 10:46 AM.  I personally performed the services described in this documentation, which was SCRIBED in my presence. The recorded information has been reviewed and considered accurate. It has been edited as necessary during review. Jonnie Kind, MD

## 2019-06-09 ENCOUNTER — Other Ambulatory Visit: Payer: Self-pay

## 2019-06-09 ENCOUNTER — Ambulatory Visit: Payer: Medicaid Other | Admitting: Podiatry

## 2019-06-09 DIAGNOSIS — M7672 Peroneal tendinitis, left leg: Secondary | ICD-10-CM

## 2019-06-09 DIAGNOSIS — G5762 Lesion of plantar nerve, left lower limb: Secondary | ICD-10-CM

## 2019-06-09 DIAGNOSIS — M79672 Pain in left foot: Secondary | ICD-10-CM

## 2019-06-09 DIAGNOSIS — M778 Other enthesopathies, not elsewhere classified: Secondary | ICD-10-CM | POA: Diagnosis not present

## 2019-06-12 ENCOUNTER — Encounter: Payer: Self-pay | Admitting: Podiatry

## 2019-06-12 NOTE — Progress Notes (Signed)
Subjective:  Patient ID: Brooke Eaton, female    DOB: June 25, 1976,  MRN: [id number]  Chief Complaint  Patient presents with  . Plantar Fasciitis    pt states that she is feeling better, but is also having some pain on the lateral side of her left foot    43 y.o. female presents with the above complaint.  Patient is here today for follow-up from a left plantar fourth interspace neuroma as well as left peroneal tendinitis.  Patient states that her fourth interspace neuroma pain has completely resolved.  She has not been experiencing any pain in that area.  She states that her pain to the left fifth metatarsal base at the insertion of peroneal tendon is still hurting.  I gave her an injection to the fifth metatarsal base for capsulitis last time which has helped decrease the amount of pain.  Patient states that greater than 50% reduction in pain was noted.  Patient has been wearing her cam boot which has also helped with the pain.  She denies any other acute complaints.   Review of Systems: Negative except as noted in the HPI. Denies N/V/F/Ch.  Past Medical History:  Diagnosis Date  . Acid reflux   . Allergy   . Anxiety   . Anxiety and depression   . Asthma   . Depression   . Fibroid   . Fibromyalgia   . Heart murmur   . Irritable bowel syndrome   . Migraines   . Plantar fasciitis   . PONV (postoperative nausea and vomiting)   . Post-traumatic stress disorder   . PTSD (post-traumatic stress disorder)   . Ruptured cyst of ovary 2001  . Ruptured cyst of ovary   . Substance abuse ([place])     Current Outpatient Medications:  .  albuterol (ACCUNEB) 0.63 MG/3ML nebulizer solution, Take 1 ampule by nebulization as needed for wheezing., Disp: , Rfl:  .  ALBUTEROL IN, Inhale into the lungs as needed., Disp: , Rfl:  .  DULoxetine (CYMBALTA) 20 MG capsule, Take by mouth., Disp: , Rfl:  .  esomeprazole (NEXIUM) 40 MG capsule, Take 1 capsule (40 mg total) by mouth 2 (two) times daily before a  meal., Disp: 180 capsule, Rfl: 3 .  gabapentin (NEURONTIN) 100 MG capsule, Take by mouth., Disp: , Rfl:  .  lidocaine (XYLOCAINE) 2 % solution, Use as directed 15 mLs in the mouth or throat every 6 (six) hours as needed for mouth pain (heartburn flare/upper stomach pain)., Disp: 100 mL, Rfl: 1 .  medroxyPROGESTERone (PROVERA) 10 MG tablet, Take 1 tablet (10 mg total) by mouth daily., Disp: 10 tablet, Rfl: 0 .  naproxen (NAPROSYN) 500 MG tablet, , Disp: , Rfl:  .  ondansetron (ZOFRAN ODT) 4 MG disintegrating tablet, Take 1 tablet (4 mg total) by mouth every 8 (eight) hours as needed for nausea or vomiting., Disp: 20 tablet, Rfl: 2 .  ondansetron (ZOFRAN) 4 MG tablet, Take 1 tablet (4 mg total) by mouth every 8 (eight) hours as needed for nausea or vomiting., Disp: 90 tablet, Rfl: 3 .  pregabalin (LYRICA) 75 MG capsule, Take 150 mg by mouth 3 (three) times daily. , Disp: , Rfl:  .  tiZANidine (ZANAFLEX) 4 MG tablet, , Disp: , Rfl:  .  traZODone (DESYREL) 50 MG tablet, Take 50 mg by mouth at bedtime., Disp: , Rfl:  .  triamcinolone cream (KENALOG) 0.5 %, As needed, Disp: , Rfl:  .  trolamine salicylate (ASPERCREME) 10 % cream, Apply  to painful areas up to 3x/day as needed for pain, Disp: , Rfl:  .  venlafaxine XR (EFFEXOR-XR) 150 MG 24 hr capsule, Take 150 mg by mouth daily with breakfast., Disp: , Rfl:  .  Vitamin D, Ergocalciferol, (DRISDOL) 1.25 MG (50000 UT) CAPS capsule, , Disp: , Rfl:   Social History   Tobacco Use  Smoking Status Never Smoker  Smokeless Tobacco Never Used    Allergies  Allergen Reactions  . Doxycycline Nausea And Vomiting    Cannot tolerate oral doxycycline   . Flagyl [Metronidazole] Itching  . Latex Itching  . Penicillins Itching    Has patient had a PCN reaction causing immediate rash, facial/tongue/throat swelling, SOB or lightheadedness with hypotension: NO Has patient had a PCN reaction causing severe rash involving mucus membranes or skin necrosis: No Has  patient had a PCN reaction that required hospitalization No Has patient had a PCN reaction occurring within the last 10 years: No If all of the above answers are "NO", then may proceed with Cephalosporin use.    Objective:  There were no vitals filed for this visit. There is no height or weight on file to calculate BMI. Constitutional Well developed. Well nourished.  Vascular Dorsalis pedis pulses palpable bilaterally. Posterior tibial pulses palpable bilaterally. Capillary refill normal to all digits.  No cyanosis or clubbing noted. Pedal hair growth normal.  Neurologic Normal speech. Oriented to person, place, and time. Epicritic sensation to light touch grossly present bilaterally.  Dermatologic Nails well groomed and normal in appearance. No open wounds. No skin lesions.  Orthopedic:  Pain on palpation to the left fifth metatarsal base.  Mild pain along the course of the peroneal tendon.  Pain on eversion of the foot active and passive.  No pain on plantar flexion dorsiflexion or inversion of the foot.  Left fourth interspace neuroma symptoms have resolved.  Negative Mulder click negative pain on palpation to the fourth interspace.   Radiographs: None Assessment:  No diagnosis found. Plan:  Patient was evaluated and treated and all questions answered.  Left peroneal tendinitis/capsulitis -Given that patient had greater than 50% of the symptoms resolved, I believe the patient will benefit from another injection at the fifth metatarsal base to completely resolve her pain. -Patient will continue to use a cam boot as needed to address her pain.  If her pain starts acting up I explained to the patient that she can go right back in the cam boot for couple weeks to decrease the amount of pain and then transition back to regular sneakers. -A steroid injection was performed at left fifth metatarsal base using 1% plain Lidocaine and 10 mg of Kenalog. This was well tolerated.  Left  fourth interspace neuroma -Resolved with injections.    No follow-ups on file.

## 2019-06-25 ENCOUNTER — Ambulatory Visit: Payer: Medicaid Other | Admitting: Obstetrics and Gynecology

## 2019-06-29 ENCOUNTER — Other Ambulatory Visit: Payer: Self-pay | Admitting: Internal Medicine

## 2019-06-29 DIAGNOSIS — Z1231 Encounter for screening mammogram for malignant neoplasm of breast: Secondary | ICD-10-CM

## 2019-07-05 ENCOUNTER — Other Ambulatory Visit: Payer: Self-pay

## 2019-07-05 ENCOUNTER — Ambulatory Visit
Admission: RE | Admit: 2019-07-05 | Discharge: 2019-07-05 | Disposition: A | Discharge status: Home / Self Care | Payer: Medicaid Other | Source: Ambulatory Visit | Attending: Internal Medicine | Admitting: Internal Medicine

## 2019-07-05 DIAGNOSIS — Z1231 Encounter for screening mammogram for malignant neoplasm of breast: Secondary | ICD-10-CM

## 2019-07-09 ENCOUNTER — Ambulatory Visit (INDEPENDENT_AMBULATORY_CARE_PROVIDER_SITE_OTHER): Payer: Medicaid Other | Admitting: Podiatry

## 2019-07-09 ENCOUNTER — Encounter

## 2019-07-09 ENCOUNTER — Other Ambulatory Visit: Payer: Self-pay

## 2019-07-09 DIAGNOSIS — L600 Ingrowing nail: Secondary | ICD-10-CM | POA: Diagnosis not present

## 2019-07-09 DIAGNOSIS — M79674 Pain in right toe(s): Secondary | ICD-10-CM

## 2019-07-09 NOTE — Patient Instructions (Signed)

## 2019-07-12 ENCOUNTER — Encounter: Payer: Self-pay | Admitting: Podiatry

## 2019-07-12 NOTE — Progress Notes (Signed)
Subjective:  Patient ID: Brooke Eaton, female    DOB: [birth year],  MRN: [id number]  Chief Complaint  Patient presents with  . Nail Problem    R Hallux - Medial. Pt stated, "I had ingrown nails in 2009, and I had to have surgery". No drainage, but pt has pain that wakes her up at night.  . Follow-up    L Foot - Pt stated, "I am doing much better".    43 y.o. female presents with the above complaint.  Patient states that it has been painful to the right medial side for a long time.  She states that she had surgery done before but has not helped at all.  She states that she is doing much better on the left side.  However her right medial ingrown has is constantly bothering her is worse with pressure is worse when ambulating.  She has not tried any acute treatment.  She has tried soaking her feet which has not helped.  She denies any other acute complaints.   Review of Systems: Negative except as noted in the HPI. Denies N/V/F/Ch.  Past Medical History:  Diagnosis Date  . Acid reflux   . Allergy   . Anxiety   . Anxiety and depression   . Asthma   . Depression   . Fibroid   . Fibromyalgia   . Heart murmur   . Irritable bowel syndrome   . Migraines   . Plantar fasciitis   . PONV (postoperative nausea and vomiting)   . Post-traumatic stress disorder   . PTSD (post-traumatic stress disorder)   . Ruptured cyst of ovary 2001  . Ruptured cyst of ovary   . Substance abuse ([place])     Current Outpatient Medications:  .  albuterol (ACCUNEB) 0.63 MG/3ML nebulizer solution, Take 1 ampule by nebulization as needed for wheezing., Disp: , Rfl:  .  ALBUTEROL IN, Inhale into the lungs as needed., Disp: , Rfl:  .  DULoxetine (CYMBALTA) 20 MG capsule, Take by mouth., Disp: , Rfl:  .  esomeprazole (NEXIUM) 40 MG capsule, Take 1 capsule (40 mg total) by mouth 2 (two) times daily before a meal., Disp: 180 capsule, Rfl: 3 .  gabapentin (NEURONTIN) 100 MG capsule, Take by mouth., Disp: , Rfl:  .   lidocaine (XYLOCAINE) 2 % solution, Use as directed 15 mLs in the mouth or throat every 6 (six) hours as needed for mouth pain (heartburn flare/upper stomach pain)., Disp: 100 mL, Rfl: 1 .  medroxyPROGESTERone (PROVERA) 10 MG tablet, Take 1 tablet (10 mg total) by mouth daily., Disp: 10 tablet, Rfl: 0 .  naproxen (NAPROSYN) 500 MG tablet, , Disp: , Rfl:  .  ondansetron (ZOFRAN ODT) 4 MG disintegrating tablet, Take 1 tablet (4 mg total) by mouth every 8 (eight) hours as needed for nausea or vomiting., Disp: 20 tablet, Rfl: 2 .  ondansetron (ZOFRAN) 4 MG tablet, Take 1 tablet (4 mg total) by mouth every 8 (eight) hours as needed for nausea or vomiting., Disp: 90 tablet, Rfl: 3 .  pregabalin (LYRICA) 75 MG capsule, Take 150 mg by mouth 3 (three) times daily. , Disp: , Rfl:  .  tiZANidine (ZANAFLEX) 4 MG tablet, , Disp: , Rfl:  .  traZODone (DESYREL) 50 MG tablet, Take 50 mg by mouth at bedtime., Disp: , Rfl:  .  triamcinolone cream (KENALOG) 0.5 %, As needed, Disp: , Rfl:  .  trolamine salicylate (ASPERCREME) 10 % cream, Apply to painful areas up to 3x/day  as needed for pain, Disp: , Rfl:  .  venlafaxine XR (EFFEXOR-XR) 150 MG 24 hr capsule, Take 150 mg by mouth daily with breakfast., Disp: , Rfl:  .  Vitamin D, Ergocalciferol, (DRISDOL) 1.25 MG (50000 UT) CAPS capsule, , Disp: , Rfl:   Social History   Tobacco Use  Smoking Status Never Smoker  Smokeless Tobacco Never Used    Allergies  Allergen Reactions  . Doxycycline Nausea And Vomiting    Cannot tolerate oral doxycycline   . Flagyl [Metronidazole] Itching  . Latex Itching  . Penicillins Itching    Has patient had a PCN reaction causing immediate rash, facial/tongue/throat swelling, SOB or lightheadedness with hypotension: NO Has patient had a PCN reaction causing severe rash involving mucus membranes or skin necrosis: No Has patient had a PCN reaction that required hospitalization No Has patient had a PCN reaction occurring within the  last 10 years: No If all of the above answers are "NO", then may proceed with Cephalosporin use.    Objective:  There were no vitals filed for this visit. There is no height or weight on file to calculate BMI. Constitutional Well developed. Well nourished.  Vascular Dorsalis pedis pulses palpable bilaterally. Posterior tibial pulses palpable bilaterally. Capillary refill normal to all digits.  No cyanosis or clubbing noted. Pedal hair growth normal.  Neurologic Normal speech. Oriented to person, place, and time. Epicritic sensation to light touch grossly present bilaterally.  Dermatologic Painful ingrowing nail at medial nail borders of the hallux nail right. No other open wounds. No skin lesions.  Orthopedic: Normal joint ROM without pain or crepitus bilaterally. No visible deformities. No bony tenderness.   Radiographs: None Assessment:  No diagnosis found. Plan:  Patient was evaluated and treated and all questions answered.  Ingrown Nail, right -Patient elects to proceed with minor surgery to remove ingrown toenail removal today. Consent reviewed and signed by patient. -Ingrown nail excised. See procedure note. -Educated on post-procedure care including soaking. Written instructions provided and reviewed. -Patient to follow up in 2 weeks for nail check.  Procedure: Excision of Ingrown Toenail Location: Right 1st toe medial nail borders. Anesthesia: Lidocaine 1% plain; 1.5 mL and Marcaine 0.5% plain; 1.5 mL, digital block. Skin Prep: Betadine. Dressing: Silvadene; telfa; dry, sterile, compression dressing. Technique: Following skin prep, the toe was exsanguinated and a tourniquet was secured at the base of the toe. The affected nail border was freed, split with a nail splitter, and excised. Chemical matrixectomy was then performed with phenol and irrigated out with alcohol. The tourniquet was then removed and sterile dressing applied. Disposition: Patient tolerated  procedure well. Patient to return in 2 weeks for follow-up.   No follow-ups on file.

## 2019-09-14 ENCOUNTER — Ambulatory Visit (INDEPENDENT_AMBULATORY_CARE_PROVIDER_SITE_OTHER): Payer: Medicaid Other | Admitting: Obstetrics and Gynecology

## 2019-09-14 ENCOUNTER — Other Ambulatory Visit: Payer: Self-pay

## 2019-09-14 ENCOUNTER — Encounter: Payer: Self-pay | Admitting: Obstetrics and Gynecology

## 2019-09-14 VITALS — BP 130/91 | HR 81 | Ht 66.0 in | Wt 239.4 lb

## 2019-09-14 DIAGNOSIS — N858 Other specified noninflammatory disorders of uterus: Secondary | ICD-10-CM | POA: Diagnosis not present

## 2019-09-14 DIAGNOSIS — N83209 Unspecified ovarian cyst, unspecified side: Secondary | ICD-10-CM

## 2019-09-14 NOTE — Progress Notes (Signed)
Patient ID: Brooke Eaton, female   DOB: Jan 15, 1976, 44 y.o.   MRN: [id number]    [facility] Visit  @DATE @            Patient name: Brooke Eaton MRN [id number]  Date of birth: 04-Jul-1976  CC & HPI:  Brooke Eaton is a 44 y.o. female presenting today for discussion of cyst on uterus. 2.9 cm cystic lesion, non concerning. She brought her MRI results form January 2021 with her to discuss. She has an additional MRI of her pelvis for f/u with her rheumatologist.   ROS:  ROS +cystic lesion on uterus -pelvic pain  All systems are negative except as noted in the HPI and PMH.   Pertinent History Reviewed:   Reviewed: Significant for Medical         Past Medical History:  Diagnosis Date  . Acid reflux   . Allergy   . Anxiety   . Anxiety and depression   . Asthma   . Depression   . Fibroid   . Fibromyalgia   . Heart murmur   . Irritable bowel syndrome   . Migraines   . Plantar fasciitis   . PONV (postoperative nausea and vomiting)   . Post-traumatic stress disorder   . PTSD (post-traumatic stress disorder)   . Ruptured cyst of ovary 2001  . Ruptured cyst of ovary   . Substance abuse [facility])                               Surgical Hx:    Past Surgical History:  Procedure Laterality Date  . ADENOIDECTOMY    . COLONOSCOPY N/A 11/07/2016   Procedure: COLONOSCOPY;  Surgeon: Danie Binder, MD;  [facility];  Service: Endoscopy;  Laterality: N/A;  1:30pm-rescheduled to 4/5 at 10:30am per Ginger  . DILATION AND CURETTAGE OF UTERUS    . DILITATION & CURRETTAGE/HYSTROSCOPY WITH NOVASURE ABLATION N/A 10/01/2016   Procedure: DILATATION & CURETTAGE/HYSTEROSCOPY WITH NOVASURE ABLATION AND EXCISION OF VULVAR/GLUTEAL  CYST;  Surgeon: Jonnie Kind, MD;  Location: [facility];  Service: Gynecology;  Laterality: N/A;  . ESOPHAGOGASTRODUODENOSCOPY N/A 11/07/2016   Procedure: ESOPHAGOGASTRODUODENOSCOPY (EGD);  Surgeon: Danie Binder, MD;  [facility];  Service: Endoscopy;   Laterality: N/A;  . SAVORY DILATION N/A 11/07/2016   Procedure: SAVORY DILATION;  Surgeon: Danie Binder, MD;  [facility];  Service: Endoscopy;  Laterality: N/A;  . TONSILLECTOMY    . TUBAL LIGATION  2016  . WISDOM TOOTH EXTRACTION     Medications: Reviewed & Updated - see associated section                       Current Outpatient Medications:  .  albuterol (ACCUNEB) 0.63 MG/3ML nebulizer solution, Take 1 ampule by nebulization as needed for wheezing., Disp: , Rfl:  .  ALBUTEROL IN, Inhale into the lungs as needed., Disp: , Rfl:  .  DULoxetine (CYMBALTA) 20 MG capsule, Take by mouth., Disp: , Rfl:  .  esomeprazole (NEXIUM) 40 MG capsule, Take 1 capsule (40 mg total) by mouth 2 (two) times daily before a meal., Disp: 180 capsule, Rfl: 3 .  gabapentin (NEURONTIN) 100 MG capsule, Take by mouth., Disp: , Rfl:  .  lidocaine (XYLOCAINE) 2 % solution, Use as directed 15 mLs in the mouth or throat every 6 (six) hours as needed for mouth pain (heartburn  flare/upper stomach pain)., Disp: 100 mL, Rfl: 1 .  medroxyPROGESTERone (PROVERA) 10 MG tablet, Take 1 tablet (10 mg total) by mouth daily., Disp: 10 tablet, Rfl: 0 .  naproxen (NAPROSYN) 500 MG tablet, , Disp: , Rfl:  .  ondansetron (ZOFRAN ODT) 4 MG disintegrating tablet, Take 1 tablet (4 mg total) by mouth every 8 (eight) hours as needed for nausea or vomiting., Disp: 20 tablet, Rfl: 2 .  ondansetron (ZOFRAN) 4 MG tablet, Take 1 tablet (4 mg total) by mouth every 8 (eight) hours as needed for nausea or vomiting., Disp: 90 tablet, Rfl: 3 .  pregabalin (LYRICA) 75 MG capsule, Take 150 mg by mouth 3 (three) times daily. , Disp: , Rfl:  .  tiZANidine (ZANAFLEX) 4 MG tablet, , Disp: , Rfl:  .  traZODone (DESYREL) 50 MG tablet, Take 50 mg by mouth at bedtime., Disp: , Rfl:  .  triamcinolone cream (KENALOG) 0.5 %, As needed, Disp: , Rfl:  .  trolamine salicylate (ASPERCREME) 10 % cream, Apply to painful areas up to 3x/day as needed for pain,  Disp: , Rfl:  .  venlafaxine XR (EFFEXOR-XR) 150 MG 24 hr capsule, Take 150 mg by mouth daily with breakfast., Disp: , Rfl:  .  Vitamin D, Ergocalciferol, (DRISDOL) 1.25 MG (50000 UT) CAPS capsule, , Disp: , Rfl:    Social History: Reviewed -  reports that she has never smoked. She has never used smokeless tobacco.  Objective Findings:  Vitals: There were no vitals taken for this visit.  PHYSICAL EXAMINATION General appearance - alert, well appearing, and in no distress Mental status - normal mood, behavior, speech, dress, motor activity, and thought processes, affect appropriate to mood  PELVIC Deferred  Assessment & Plan:   A:  1.  2.9 cm cystic lesion behind  Uterus, incidental MRI finding, non concerning  P:  1. 6 week March f/u TV u/s    By signing my name below, I, [name], attest that this documentation has been prepared under the direction and in the presence of Jonnie Kind, MD. Electronically Signed: [place]. 09/14/19. 11:52 AM.  I personally performed the services described in this documentation, which was SCRIBED in my presence. The recorded information has been reviewed and considered accurate. It has been edited as necessary during review. Jonnie Kind, MD

## 2019-10-12 ENCOUNTER — Ambulatory Visit (INDEPENDENT_AMBULATORY_CARE_PROVIDER_SITE_OTHER): Payer: Medicaid Other | Admitting: Nurse Practitioner

## 2019-10-12 ENCOUNTER — Other Ambulatory Visit: Payer: Self-pay

## 2019-10-12 ENCOUNTER — Encounter: Payer: Self-pay | Admitting: Nurse Practitioner

## 2019-10-12 VITALS — BP 126/85 | HR 84 | Temp 96.9°F | Ht 66.0 in | Wt 242.0 lb

## 2019-10-12 DIAGNOSIS — K5900 Constipation, unspecified: Secondary | ICD-10-CM

## 2019-10-12 DIAGNOSIS — K219 Gastro-esophageal reflux disease without esophagitis: Secondary | ICD-10-CM

## 2019-10-12 DIAGNOSIS — R1013 Epigastric pain: Secondary | ICD-10-CM

## 2019-10-12 NOTE — Patient Instructions (Signed)
Your health issues we discussed today were:   GERD (heartburn/reflux) with upper abdominal pain: 1. I am glad you are doing better! 2. Continue taking your Nexium twice a day 3. If you are able to lose some weight, this would help with your reflux symptoms and could allow [place] to come down on or potentially stop your acid blocker 4. Further information about the weight loss clinic as below.  Constipation: 1. Continue your dietary changes to help move your bowels regularly. 2. Make sure you are drinking enough water every day 3. Eat 4-5 servings of fruits and/or vegetables a day 4. Choose whole-grain products rather than white/bleach products (for example whole-grain bread versus white bread) 5. Call us if you have any worsening or severe symptoms  Desired weight loss: 1. Discussed with your PCP your reluctance to take dietary supplements 2. See if they are able to refer you to the [facility]" in [place] 3. The weight clinic in [place] phone number is [phone], if you would like to call and get more information  Overall I recommend:  1. Continue your other current medications 2. Return for follow-up in 1 year 3. Call us if you have any questions or concerns   ---------------------------------------------------------------  COVID-19 Vaccine Information can be found at: [email or web address].co.uk For questions related to vaccine distribution or appointments, please email [email or web address] or call [phone].   ---------------------------------------------------------------   At [facility] we value your feedback. You may receive a survey about your visit today. Please share your experience as we strive to create trusting relationships with our patients to provide genuine, compassionate, quality care.  We appreciate your understanding and patience as we review any  laboratory studies, imaging, and other diagnostic tests that are ordered as we care for you. Our office policy is 5 business days for review of these results, and any emergent or urgent results are addressed in a timely manner for your best interest. If you do not hear from our office in 1 week, please contact us.   We also encourage the use of MyChart, which contains your medical information for your review as well. If you are not enrolled in this feature, an access code is on this after visit summary for your convenience. Thank you for allowing [place] to be involved in your care.  It was great to see you today!  I hope you have a great day!!

## 2019-10-12 NOTE — Progress Notes (Signed)
Referring Provider: [facility] Primary Care Physician:  [facility], [facility] Primary GI:  Dr. Oneida Alar  Chief Complaint  Patient presents with  . Gastroesophageal Reflux    doing ok    HPI:   Brooke Eaton is a 44 y.o. female who presents for follow-up on GERD and epigastric pain.  The patient was last seen in our office 04/13/2019 for the same as well as non-intractable nausea and vomiting.  Noted history of chronic GERD previous trial of various PPIs including Prevacid, Nexium, omeprazole which failed or had adverse effects.  History of IBS diarrhea type.  Previous Schatzki's ring status post dilation in 2018.  Colonoscopy up-to-date.  Noted stress is a major trigger of GERD.  CT of the abdomen and pelvis completed 02/24/2019 found no findings to explain epigastric pain, nausea, vomiting.  When we relayed results she noted she was doing well as long she uses 2% milk or lactose-free milk.  At her last visit she had switched milk back to the kind she previously drank, still on PPI.  No overt GI complaints.  Recommended continue Nexium (refill sent) and Zofran as needed (refill sent).  Take Nexium twice a day.  Follow-up in 6 months.  Call for any worsening or severe symptoms.  Today she states she's doing ok overall. She is accompanied by her young daughter (temp checked and normal, wearing a mask). She's doing better with her GERD symptoms since Nexium increased to twice a day. She has not had any episodes since we last saw her. She did have some constipation but is eating better foods to keep bowels regular. Denies overt abdominal pain, N/V, hematochezia, melena, fever, chills, unintentional weight loss. Denies URI or flu-like symptoms. Denies loss of sense of taste or smell. She did have pneumonia, was tested for COVID-19 which was negative. Denies chest pain, dyspnea, dizziness, lightheadedness, syncope, near syncope. Denies any other upper or lower GI symptoms.  Past Medical History:    Diagnosis Date  . Acid reflux   . Allergy   . Anxiety   . Anxiety and depression   . Asthma   . Depression   . Fibroid   . Fibromyalgia   . Heart murmur   . Irritable bowel syndrome   . Migraines   . 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 Plantar fasciitis   . PONV (postoperative nausea and vomiting)   . Post-traumatic stress disorder   . PTSD (post-traumatic stress disorder)   . Ruptured cyst of ovary 2001  . Ruptured cyst of ovary   . Substance abuse [facility])     Past Surgical History:  Procedure Laterality Date  . ADENOIDECTOMY    . COLONOSCOPY N/A 11/07/2016   Procedure: COLONOSCOPY;  Surgeon: Danie Binder, MD;  [facility];  Service: Endoscopy;  Laterality: N/A;  1:30pm-rescheduled to 4/5 at 10:30am per Ginger  . DILATION AND CURETTAGE OF UTERUS    . DILITATION & CURRETTAGE/HYSTROSCOPY WITH NOVASURE ABLATION N/A 10/01/2016   Procedure: DILATATION & CURETTAGE/HYSTEROSCOPY WITH NOVASURE ABLATION AND EXCISION OF VULVAR/GLUTEAL  CYST;  Surgeon: Jonnie Kind, MD;  Location: [facility];  Service: Gynecology;  Laterality: N/A;  . ESOPHAGOGASTRODUODENOSCOPY N/A 11/07/2016   Procedure: ESOPHAGOGASTRODUODENOSCOPY (EGD);  Surgeon: Danie Binder, MD;  [facility];  Service: Endoscopy;  Laterality: N/A;  . SAVORY DILATION N/A 11/07/2016   Procedure: SAVORY DILATION;  Surgeon: Danie Binder, MD;  [facility];  Service: Endoscopy;  Laterality: N/A;  . TONSILLECTOMY    .  TUBAL LIGATION  2016  . WISDOM TOOTH EXTRACTION      Current Outpatient Medications  Medication Sig Dispense Refill  . albuterol (ACCUNEB) 0.63 MG/3ML nebulizer solution Take 1 ampule by nebulization as needed for wheezing.    . ALBUTEROL IN Inhale into the lungs as needed.    . cholecalciferol (VITAMIN D3) 25 MCG (1000 UNIT) tablet Take 1,000 Units by mouth daily.    Marland Kitchen ELDERBERRY PO Take by mouth.    . esomeprazole (NEXIUM) 40 MG capsule Take 1 capsule (40 mg total) by mouth 2 (two) times daily  before a meal. 180 capsule 3  . naproxen (NAPROSYN) 500 MG tablet Take 500 mg by mouth daily.     . ondansetron (ZOFRAN ODT) 4 MG disintegrating tablet Take 1 tablet (4 mg total) by mouth every 8 (eight) hours as needed for nausea or vomiting. 20 tablet 2  . ondansetron (ZOFRAN) 4 MG tablet Take 1 tablet (4 mg total) by mouth every 8 (eight) hours as needed for nausea or vomiting. 90 tablet 3  . pregabalin (LYRICA) 75 MG capsule Take 150 mg by mouth 3 (three) times daily.     Marland Kitchen tiZANidine (ZANAFLEX) 4 MG tablet as needed.     . traZODone (DESYREL) 50 MG tablet Take 50 mg by mouth at bedtime.    . triamcinolone cream (KENALOG) 0.5 % As needed    . venlafaxine XR (EFFEXOR-XR) 150 MG 24 hr capsule Take 150 mg by mouth daily with breakfast.     No current facility-administered medications for this visit.    Allergies as of 10/12/2019 - Review Complete 10/12/2019  Allergen Reaction Noted  . Doxycycline Nausea And Vomiting 10/27/2011  . Flagyl [metronidazole] Itching 01/17/2012  . Latex Itching 10/27/2011  . Penicillins Itching 10/27/2011    Family History  Problem Relation Age of Onset  . Emphysema Maternal Grandmother   . Gout Maternal Grandmother   . Diabetes Maternal Grandmother   . Gout Father   . Alcohol abuse Father   . Arthritis Father   . COPD Father   . Depression Father   . Drug abuse Father   . Hyperlipidemia Father   . Hypertension Father   . Gout Mother   . Arthritis Mother        RA  . Diabetes Mother   . Depression Mother   . Heart disease Mother   . Hyperlipidemia Mother   . Hypertension Mother   . Vision loss Mother   . Thyroid disease Maternal Aunt   . Diabetes Maternal Uncle   . Breast cancer Other   . Breast cancer Paternal Aunt 4  . Breast cancer Paternal Aunt 58  . Colon cancer Neg Hx   . Inflammatory bowel disease Neg Hx   . Celiac disease Neg Hx     Social History   Socioeconomic History  . Marital status: Single    Spouse name: n/a  .  Number of children: 1  . Years of education: 87  . Highest education level: Not on file  Occupational History  . Not on file  Tobacco Use  . Smoking status: Never Smoker  . Smokeless tobacco: Never Used  Substance and Sexual Activity  . Alcohol use: Not Currently    Comment: WINE OCCASIONAL  . Drug use: Yes    Types: Marijuana    Comment: daily  . Sexual activity: Yes    Birth control/protection: Surgical    Comment: tubal and ablation  Other Topics Concern  .  Not on file  Social History Narrative   Single parent   One daughter - almost 2   Social Determinants of Health   Financial Resource Strain:   . Difficulty of Paying Living Expenses: Not on file  Food Insecurity:   . Worried About Charity fundraiser in the Last Year: Not on file  . Ran Out of Food in the Last Year: Not on file  Transportation Needs:   . Lack of Transportation (Medical): Not on file  . Lack of Transportation (Non-Medical): Not on file  Physical Activity:   . Days of Exercise per Week: Not on file  . Minutes of Exercise per Session: Not on file  Stress:   . Feeling of Stress : Not on file  Social Connections:   . Frequency of Communication with Friends and Family: Not on file  . Frequency of Social Gatherings with Friends and Family: Not on file  . Attends Religious Services: Not on file  . Active Member of Clubs or Organizations: Not on file  . Attends Archivist Meetings: Not on file  . Marital Status: Not on file    Review of Systems: General: Negative for anorexia, weight loss, fever, chills, fatigue, weakness. ENT: Negative for hoarseness, difficulty swallowing. CV: Negative for chest pain, angina, palpitations, peripheral edema.  Respiratory: Negative for dyspnea at rest, cough, sputum, wheezing.  GI: See history of present illness. Endo: Negative for unusual weight change.  Heme: Negative for bruising or bleeding. Allergy: Negative for rash or hives.   Physical Exam: BP  126/85   Pulse 84   Temp (!) 96.9 F (36.1 C) (Temporal)   Ht 5\' 6"  (1.676 m)   Wt 242 lb (109.8 kg)   BMI 39.06 kg/m  General:   Alert and oriented. Pleasant and cooperative. Well-nourished and well-developed.  Eyes:  Without icterus, sclera clear and conjunctiva pink.  Ears:  Normal auditory acuity. Cardiovascular:  S1, S2 present without murmurs appreciated. Extremities without clubbing or edema. Respiratory:  Clear to auscultation bilaterally. No wheezes, rales, or rhonchi. No distress.  Gastrointestinal:  +BS, soft, non-tender and non-distended. No HSM noted. No guarding or rebound. No masses appreciated.  Rectal:  Deferred  Musculoskalatal:  Symmetrical without gross deformities. Neurologic:  Alert and oriented x4;  grossly normal neurologically. Psych:  Alert and cooperative. Normal mood and affect. Heme/Lymph/Immune: No excessive bruising noted.    10/12/2019 10:12 AM   Disclaimer: This note was dictated with voice recognition software. Similar sounding words can inadvertently be transcribed and may not be corrected upon review.

## 2019-10-12 NOTE — Assessment & Plan Note (Signed)
The patient was previously diagnosed with constipation based on abdominal imaging in the emergency department and given medications to help her clear her bowels.  She is not keen to take medications.  She has been making dietary changes which have helped her move her bowels regularly.  I recommended adequate water intake, 4-5 servings of fibers foods such as fruits and vegetables, choosing whole grains rather than processed/[place]li[place] grains.  If she needs assistance she can try an over-the-counter fiber supplement.  Otherwise, continue current medications and follow-up in 1 year.

## 2019-10-12 NOTE — Assessment & Plan Note (Signed)
Significantly improved, essentially resolved on improved GERD management as per above.  Recommend she monitor and notify us of any worsening symptoms, otherwise follow-up in 1 year.

## 2019-10-12 NOTE — Assessment & Plan Note (Signed)
GERD symptoms doing much better on twice daily Nexium.  She has not had any flares or breakthrough since we last saw her and increased her medication.  She is working on weight loss with her primary care, but does not want to take weight loss supplements.  I recommended she talk to them about referral to the [facility] health weight management clinic.  Weight reduction would likely improve her GERD symptoms and could result in her being able to come down on or off of her PPI.  I will provide further information about that clinic in her AVS.  Follow-up in 1 year.

## 2019-10-15 ENCOUNTER — Other Ambulatory Visit: Payer: Self-pay | Admitting: Obstetrics and Gynecology

## 2019-10-15 DIAGNOSIS — Z8742 Personal history of other diseases of the female genital tract: Secondary | ICD-10-CM

## 2019-10-18 ENCOUNTER — Other Ambulatory Visit: Payer: Medicaid Other

## 2019-10-19 ENCOUNTER — Other Ambulatory Visit: Payer: Self-pay

## 2019-10-19 ENCOUNTER — Ambulatory Visit: Payer: Medicaid Other | Admitting: Obstetrics and Gynecology

## 2019-10-19 ENCOUNTER — Ambulatory Visit (INDEPENDENT_AMBULATORY_CARE_PROVIDER_SITE_OTHER): Payer: Medicaid Other

## 2019-10-19 ENCOUNTER — Encounter: Payer: Self-pay | Admitting: Obstetrics and Gynecology

## 2019-10-19 VITALS — BP 114/71 | HR 87 | Ht 66.0 in | Wt 240.6 lb

## 2019-10-19 DIAGNOSIS — Z8742 Personal history of other diseases of the female genital tract: Secondary | ICD-10-CM

## 2019-10-19 DIAGNOSIS — N858 Other specified noninflammatory disorders of uterus: Secondary | ICD-10-CM | POA: Diagnosis not present

## 2019-10-19 DIAGNOSIS — R188 Other ascites: Secondary | ICD-10-CM | POA: Insufficient documentation

## 2019-10-19 NOTE — Progress Notes (Signed)
PELVIC US TA/TV: heterogeneous retroverted uterus with anterior fundal linear striations,mult.small posterior myometrial cysts adjacent to endometrium (? Adenomyosis),normal ovaries,ovaries appear mobile,small amount of simple right adnexal fluid,EEC 3.5 mm,no pain during ultrasound

## 2019-10-19 NOTE — Progress Notes (Signed)
Patient ID: Audray Dargenio, female   DOB: August 20, 1975, 44 y.o.   MRN: [id number]    [facility] Visit  @DATE @            Patient name: Brooke Eaton MRN [id number]  Date of birth: 1976-05-06  CC & HPI:  Brooke Eaton is a 44 y.o. female presenting today for a transvaginal US of the 2.9 cm cystic lesion behind her uterus noted on recent CT.Marland Kitchen She reports that she does not have menstrual periods since having her ablation. The patient denies fever, chills or any other symptoms or complaints at this time. TV u;s was nontender.   ROS:  ROS - fever - chills All systems are negative except as noted in the HPI and PMH.   Pertinent History Reviewed:   Reviewed:  Medical         Past Medical History:  Diagnosis Date  . Acid reflux   . Allergy   . Anxiety   . Anxiety and depression   . Asthma   . Depression   . Fibroid   . Fibromyalgia   . Heart murmur   . Irritable bowel syndrome   . Migraines   . 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 Plantar fasciitis   . PONV (postoperative nausea and vomiting)   . Post-traumatic stress disorder   . PTSD (post-traumatic stress disorder)   . Ruptured cyst of ovary 2001  . Ruptured cyst of ovary   . Substance abuse [facility])                               Surgical Hx:    Past Surgical History:  Procedure Laterality Date  . ADENOIDECTOMY    . COLONOSCOPY N/A 11/07/2016   Procedure: COLONOSCOPY;  Surgeon: Danie Binder, MD;  Location: [facility];  Service: Endoscopy;  Laterality: N/A;  1:30pm-rescheduled to 4/5 at 10:30am per Ginger  . DILATION AND CURETTAGE OF UTERUS    . DILITATION & CURRETTAGE/HYSTROSCOPY WITH NOVASURE ABLATION N/A 10/01/2016   Procedure: DILATATION & CURETTAGE/HYSTEROSCOPY WITH NOVASURE ABLATION AND EXCISION OF VULVAR/GLUTEAL  CYST;  Surgeon: Jonnie Kind, MD;  Location: [facility] ORS;  Service: Gynecology;  Laterality: N/A;  . ESOPHAGOGASTRODUODENOSCOPY N/A 11/07/2016   Procedure: ESOPHAGOGASTRODUODENOSCOPY (EGD);  Surgeon: Danie Binder, MD;   Location: [facility];  Service: Endoscopy;  Laterality: N/A;  . SAVORY DILATION N/A 11/07/2016   Procedure: SAVORY DILATION;  Surgeon: Danie Binder, MD;  Location: [facility];  Service: Endoscopy;  Laterality: N/A;  . TONSILLECTOMY    . TUBAL LIGATION  2016  . WISDOM TOOTH EXTRACTION     Medications: Reviewed & Updated - see associated section                       Current Outpatient Medications:  .  albuterol (ACCUNEB) 0.63 MG/3ML nebulizer solution, Take 1 ampule by nebulization as needed for wheezing., Disp: , Rfl:  .  ALBUTEROL IN, Inhale into the lungs as needed., Disp: , Rfl:  .  cholecalciferol (VITAMIN D3) 25 MCG (1000 UNIT) tablet, Take 1,000 Units by mouth daily., Disp: , Rfl:  .  ELDERBERRY PO, Take by mouth., Disp: , Rfl:  .  esomeprazole (NEXIUM) 40 MG capsule, Take 1 capsule (40 mg total) by mouth 2 (two) times daily before a meal., Disp: 180 capsule, Rfl: 3 .  naproxen (NAPROSYN) 500 MG tablet, Take 500 mg by  mouth daily. , Disp: , Rfl:  .  ondansetron (ZOFRAN ODT) 4 MG disintegrating tablet, Take 1 tablet (4 mg total) by mouth every 8 (eight) hours as needed for nausea or vomiting., Disp: 20 tablet, Rfl: 2 .  ondansetron (ZOFRAN) 4 MG tablet, Take 1 tablet (4 mg total) by mouth every 8 (eight) hours as needed for nausea or vomiting., Disp: 90 tablet, Rfl: 3 .  pregabalin (LYRICA) 75 MG capsule, Take 150 mg by mouth 3 (three) times daily. , Disp: , Rfl:  .  tiZANidine (ZANAFLEX) 4 MG tablet, as needed. , Disp: , Rfl:  .  traZODone (DESYREL) 50 MG tablet, Take 50 mg by mouth at bedtime., Disp: , Rfl:  .  triamcinolone cream (KENALOG) 0.5 %, As needed, Disp: , Rfl:  .  venlafaxine XR (EFFEXOR-XR) 150 MG 24 hr capsule, Take 150 mg by mouth daily with breakfast., Disp: , Rfl:    Social History: Reviewed -  reports that she has never smoked. She has never used smokeless tobacco.  Objective Findings:  Vitals: Blood pressure 114/71, pulse 87, height 5\' 6"  (1.676 m), weight  240 lb 9.6 oz (109.1 kg).  PHYSICAL EXAMINATION General appearance - alert, well appearing, and in no distress, oriented to person, place, and time and overweight Mental status - alert, oriented to person, place, and time, normal mood, behavior, speech, dress, motor activity, and thought processes, affect appropriate to mood  PELVIC DEFERRED GYNECOLOGIC SONOGRAM   Brooke Eaton is a 44 y.o. [id number] No LMP recorded. Patient has had an ablation. for a pelvic sonogram for cyst seen on MRI.  Uterus                      5.7 x 4 x 4.1 cm, Total uterine volume 49 cc, heterogeneous retroverted uterus with anterior fundal linear striations,mult.small posterior myometrial cysts adjacent to endometrium (? Adenomyosis)  Endometrium          3.5 mm, symmetrical, WNL  Right ovary             2.1 X 1.3 X 2.7 cm,wnl  Left ovary                3.7 x 2.1 x 2.3 cm, wnl  small amount of simple right adnexal fluid  Technician Comments:   PELVIC US TA/TV: heterogeneous retroverted uterus with anterior fundal linear striations,mult.small posterior myometrial cysts adjacent to endometrium (? Adenomyosis),normal ovaries,ovaries appear mobile,small amount of simple right adnexal fluid,EEC 3.5 mm,no pain during ultrasound    [employer] 10/19/2019 2:55 PM Assessment & Plan:   A:  1. Normal pelvic exam 2. Resolved pelvic "cyst", physiologic pelvic fluid only.  P:  1.  Follow up prn or in 3 years  By signing my name below, I, De Burrs, attest that this documentation has been prepared under the direction and in the presence of Jonnie Kind, MD. Electronically Signed: De Burrs, Medical Scribe. 10/19/19. 3:05 PM.  I personally performed the services described in this documentation, which was SCRIBED in my presence. The recorded information has been reviewed and considered accurate. It has been edited as necessary during review. Jonnie Kind, MD

## 2020-02-16 ENCOUNTER — Encounter: Payer: Self-pay | Admitting: Obstetrics and Gynecology

## 2020-02-16 ENCOUNTER — Ambulatory Visit (INDEPENDENT_AMBULATORY_CARE_PROVIDER_SITE_OTHER): Payer: Medicaid Other | Admitting: Obstetrics and Gynecology

## 2020-02-16 VITALS — BP 120/76 | HR 90 | Ht 66.0 in | Wt 238.6 lb

## 2020-02-16 DIAGNOSIS — N764 Abscess of vulva: Secondary | ICD-10-CM

## 2020-02-16 DIAGNOSIS — L739 Follicular disorder, unspecified: Secondary | ICD-10-CM | POA: Insufficient documentation

## 2020-02-16 MED ORDER — SULFAMETHOXAZOLE-TRIMETHOPRIM 400-80 MG PO TABS: 1.0000 | ORAL_TABLET | Freq: Two times a day (BID) | ORAL | 0 refills | Status: DC

## 2020-02-16 NOTE — Progress Notes (Signed)
Patient ID: Brooke Eaton, female   DOB: 1975/11/18, 44 y.o.   MRN: [id number]  Incision & Drainage:  INCISION AND DRAINAGE PROCEDURE NOTE: Patient identification was confirmed and verbal consent was obtained. Pelvic exam revealed boil on the left side even with the posterior fourchette.  Site anesthetized with 3 cc 1% lidocaine without epinephrine, incision made over site and central core of scar tissue was removed. Left open. EBL minimal to 10 cc.  Pt tolerated procedure well without complications.  Instructions for care discussed verbally with patient and additional instructions for homecare and f/u.  Plan: 1. Rx Septra  By signing my name below, I, De Burrs, attest that this documentation has been prepared under the direction and in the presence of Jonnie Kind, MD. Electronically Signed: De Burrs, Medical Scribe. 02/16/20. 2:33 PM.  I personally performed the services described in this documentation, which was SCRIBED in my presence. The recorded information has been reviewed and considered accurate. It has been edited as necessary during review. Jonnie Kind, MD

## 2020-08-18 ENCOUNTER — Other Ambulatory Visit: Payer: Self-pay | Admitting: Internal Medicine

## 2020-08-18 DIAGNOSIS — Z1231 Encounter for screening mammogram for malignant neoplasm of breast: Secondary | ICD-10-CM

## 2020-09-19 ENCOUNTER — Other Ambulatory Visit: Payer: Self-pay | Admitting: Internal Medicine

## 2020-09-20 LAB — CBC
HCT: 36.3 % (ref 35.0–45.0)
Hemoglobin: 12.2 g/dL (ref 11.7–15.5)
MCH: 29.8 pg (ref 27.0–33.0)
MCHC: 33.6 g/dL (ref 32.0–36.0)
MCV: 88.8 fL (ref 80.0–100.0)
MPV: 9.8 fL (ref 7.5–12.5)
Platelets: 366 10*3/uL (ref 140–400)
RBC: 4.09 10*6/uL (ref 3.80–5.10)
RDW: 14.8 % (ref 11.0–15.0)
WBC: 6.7 10*3/uL (ref 3.8–10.8)

## 2020-09-20 LAB — COMPLETE METABOLIC PANEL WITH GFR
AG Ratio: 1.3 (calc) (ref 1.0–2.5)
ALT: 9 U/L (ref 6–29)
AST: 13 U/L (ref 10–30)
Albumin: 3.7 g/dL (ref 3.6–5.1)
Alkaline phosphatase (APISO): 75 U/L (ref 31–125)
BUN: 12 mg/dL (ref 7–25)
CO2: 24 mmol/L (ref 20–32)
Calcium: 8.6 mg/dL (ref 8.6–10.2)
Chloride: 108 mmol/L (ref 98–110)
Creat: 0.84 mg/dL (ref 0.50–1.10)
GFR, Est African American: 99 mL/min/{1.73_m2} (ref >=60)
GFR, Est Non African American: 85 mL/min/{1.73_m2} (ref >=60)
Globulin: 2.8 g/dL (calc) (ref 1.9–3.7)
Glucose, Bld: 107 mg/dL — ABNORMAL HIGH (ref 65–99)
Potassium: 3.9 mmol/L (ref 3.5–5.3)
Sodium: 142 mmol/L (ref 135–146)
Total Bilirubin: 0.2 mg/dL (ref 0.2–1.2)
Total Protein: 6.5 g/dL (ref 6.1–8.1)

## 2020-09-20 LAB — LIPID PANEL
Cholesterol: 201 mg/dL — ABNORMAL HIGH (ref <200)
HDL: 40 mg/dL — ABNORMAL LOW (ref >=50)
LDL Cholesterol (Calc): 138 mg/dL (calc) — ABNORMAL HIGH
Non-HDL Cholesterol (Calc): 161 mg/dL (calc) — ABNORMAL HIGH (ref <130)
Total CHOL/HDL Ratio: 5 (calc) — ABNORMAL HIGH (ref <5.0)
Triglycerides: 114 mg/dL (ref <150)

## 2020-09-20 LAB — HIV ANTIBODY (ROUTINE TESTING W REFLEX): HIV 1&2 Ab, 4th Generation: NONREACTIVE

## 2020-09-20 LAB — RPR: RPR Ser Ql: NONREACTIVE

## 2020-09-20 LAB — TSH: TSH: 0.74 mIU/L

## 2020-09-20 LAB — VITAMIN D 25 HYDROXY (VIT D DEFICIENCY, FRACTURES): Vit D, 25-Hydroxy: 10 ng/mL — ABNORMAL LOW (ref 30–100)

## 2020-09-25 ENCOUNTER — Encounter: Payer: Self-pay | Admitting: Internal Medicine

## 2020-09-26 ENCOUNTER — Ambulatory Visit: Payer: Medicaid Other

## 2020-09-29 ENCOUNTER — Ambulatory Visit: Payer: Medicaid Other

## 2020-10-18 ENCOUNTER — Ambulatory Visit: Payer: Medicaid Other | Admitting: Advanced Practice Midwife

## 2020-10-18 ENCOUNTER — Encounter: Payer: Self-pay | Admitting: Advanced Practice Midwife

## 2020-10-18 ENCOUNTER — Other Ambulatory Visit (HOSPITAL_COMMUNITY)
Admission: RE | Admit: 2020-10-18 | Discharge: 2020-10-18 | Disposition: A | Discharge status: Home / Self Care | Payer: Medicaid Other | Source: Ambulatory Visit | Attending: Advanced Practice Midwife | Admitting: Advanced Practice Midwife

## 2020-10-18 ENCOUNTER — Other Ambulatory Visit: Payer: Self-pay

## 2020-10-18 VITALS — BP 123/83 | HR 88 | Ht 66.0 in | Wt 226.5 lb

## 2020-10-18 DIAGNOSIS — Z1231 Encounter for screening mammogram for malignant neoplasm of breast: Secondary | ICD-10-CM

## 2020-10-18 DIAGNOSIS — Z01419 Encounter for gynecological examination (general) (routine) without abnormal findings: Secondary | ICD-10-CM | POA: Diagnosis present

## 2020-10-18 NOTE — Progress Notes (Signed)
[place] EXAMINATION Patient name: Brooke Eaton MRN [id number]  Date of birth: 09-28-1975 Chief Complaint:   Gynecologic Exam  History of Present Illness:   Brooke Eaton is a 45 y.o. [id number] African American female being seen today for a routine well-woman exam.  Current complaints: doing well; no cycles since endometrial ablation  Depression screen [facility] 2/9 10/18/2020 01/13/2018 12/18/2017 11/14/2017 10/23/2017  Decreased Interest 0 0 0 2 1  Down, Depressed, Hopeless 2 0 0 1 1  PHQ - 2 Score 2 0 0 3 2  Altered sleeping 1 - - 3 3  Tired, decreased energy 2 - - 3 1  Change in appetite 1 - - 1 0  Feeling bad or failure about yourself  3 - - 3 2  Trouble concentrating 0 - - 1 2  Moving slowly or fidgety/restless 2 - - 1 0  Suicidal thoughts 0 - - 1 1  PHQ-9 Score 11 - - 16 11  Difficult doing work/chores - - - Extremely dIfficult Somewhat difficult     PCP: [facility], Dr Jeanie Cooks     does not desire labs No LMP recorded. Patient has had an ablation. The current method of family planning is tubal ligation.  Last pap June 2019. Results were: NILM w/ HRHPV negative. H/O abnormal pap: no Last mammogram: Nov 2020. Results were: normal. Family h/o breast cancer: yes ; pat aunts x 2 dx at age 33 Last colonoscopy: never. Family h/o colorectal cancer: no Review of Systems:   Pertinent items are noted in HPI Denies any headaches, blurred vision, fatigue, shortness of breath, chest pain, abdominal pain, abnormal vaginal discharge/itching/odor/irritation, problems with periods, bowel movements, urination, or intercourse unless otherwise stated above. Pertinent History Reviewed:  Reviewed past medical,surgical, social and family history.  Reviewed problem list, medications and allergies. Physical Assessment:   Vitals:   10/18/20 1020  BP: 123/83  Pulse: 88  Weight: 226 lb 8 oz (102.7 kg)  Height: 5\' 6"  (1.676 m)  Body mass index is 36.56 kg/m.        Physical Examination:   General  appearance - well appearing, and in no distress  Mental status - alert, oriented to person, place, and time  Psych:  She has a normal mood and affect  Skin - warm and dry, normal color, no suspicious lesions noted  Chest - effort normal, all lung fields clear to auscultation bilaterally  Heart - normal rate and regular rhythm  Neck:  midline trachea, no thyromegaly or nodules  Breasts - breasts appear normal, no suspicious masses, no skin or nipple changes or  axillary nodes  Abdomen - soft, nontender, nondistended, no masses or organomegaly  Pelvic - VULVA: normal appearing vulva with no masses, tenderness or lesions  VAGINA: normal appearing vagina with normal color and discharge, no lesions  CERVIX: normal appearing cervix without discharge or lesions, no CMT  Thin prep pap is done with HR HPV cotesting  UTERUS: uterus is felt to be normal size, shape, consistency and nontender   ADNEXA: No adnexal masses or tenderness noted.  Extremities:  No swelling or varicosities noted  Chaperone: Celene Squibb    No results found for this or any previous visit (from the past 24 hour(s)).  Assessment & Plan:  1) Well-Woman Exam   Labs/procedures today: Pap  Mammogram: screening mammo ordered; pt to call [facility] to set up, or sooner if problems Colonoscopy: @ 45yo, or sooner if problems  Orders Placed This Encounter  Procedures  .  MM 3D SCREEN BREAST BILATERAL    Meds: No orders of the defined types were placed in this encounter.   Follow-up: Return in about 1 year (around 10/18/2021) for Physical.  [place] 10/18/2020 10:58 AM

## 2020-10-20 LAB — CYTOLOGY - PAP
Adequacy: ABSENT
Chlamydia: NEGATIVE
Comment: NEGATIVE
Comment: NEGATIVE
Comment: NORMAL
Diagnosis: NEGATIVE
High risk HPV: NEGATIVE
Neisseria Gonorrhea: NEGATIVE

## 2020-11-15 ENCOUNTER — Encounter: Payer: Self-pay | Admitting: Podiatry

## 2020-11-15 ENCOUNTER — Other Ambulatory Visit: Payer: Self-pay

## 2020-11-15 ENCOUNTER — Ambulatory Visit (INDEPENDENT_AMBULATORY_CARE_PROVIDER_SITE_OTHER): Payer: Medicaid Other

## 2020-11-15 ENCOUNTER — Ambulatory Visit (INDEPENDENT_AMBULATORY_CARE_PROVIDER_SITE_OTHER): Payer: Medicaid Other | Admitting: Podiatry

## 2020-11-15 DIAGNOSIS — S93491A Sprain of other ligament of right ankle, initial encounter: Secondary | ICD-10-CM

## 2020-11-15 NOTE — Progress Notes (Signed)
Subjective:  Patient ID: Brooke Eaton, female    DOB: Oct 29, 1975,  MRN: [id number]  Chief Complaint  Patient presents with  . Foot Pain    Bilateral foot pain     45 y.o. female presents with the above complaint.  Patient presents with new complaint of right ankle sprain.  Patient states that she fell off of a step while getting a new suit and twisted her right ankle.  Patient had boots on that prevented her from possibly further injuring the ankle however is painful to walk has gotten progressively worse.  Patient was following up with her primary care physician for which to have taken x-rays however I will not able to see the x-rays.  She denies any other acute complaints she has not seen anyone else in terms of foot and ankle specialist prior to seeing me.  Overall she is doing much better.   Review of Systems: Negative except as noted in the HPI. Denies N/V/F/Ch.  Past Medical History:  Diagnosis Date  . Acid reflux   . Allergy   . Anxiety   . Anxiety and depression   . Arthritis   . Asthma   . Depression   . Fibroid   . Fibromyalgia   . Fibromyalgia   . Heart murmur   . Irritable bowel syndrome   . Migraines   . 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 Plantar fasciitis   . PONV (postoperative nausea and vomiting)   . Post-traumatic stress disorder   . PTSD (post-traumatic stress disorder)   . Ruptured cyst of ovary 2001  . Ruptured cyst of ovary   . Substance abuse ([place])     Current Outpatient Medications:  .  albuterol (ACCUNEB) 0.63 MG/3ML nebulizer solution, Take 1 ampule by nebulization as needed for wheezing., Disp: , Rfl:  .  ALBUTEROL IN, Inhale into the lungs as needed., Disp: , Rfl:  .  cholecalciferol (VITAMIN D3) 25 MCG (1000 UNIT) tablet, Take 1,000 Units by mouth daily., Disp: , Rfl:  .  ELDERBERRY PO, Take by mouth., Disp: , Rfl:  .  esomeprazole (NEXIUM) 40 MG capsule, Take 1 capsule (40 mg total) by mouth 2 (two) times daily before a meal., Disp: 180 capsule, Rfl:  3 .  naproxen (NAPROSYN) 500 MG tablet, Take 500 mg by mouth daily. , Disp: , Rfl:  .  ondansetron (ZOFRAN ODT) 4 MG disintegrating tablet, Take 1 tablet (4 mg total) by mouth every 8 (eight) hours as needed for nausea or vomiting., Disp: 20 tablet, Rfl: 2 .  pregabalin (LYRICA) 75 MG capsule, Take 150 mg by mouth 3 (three) times daily. , Disp: , Rfl:  .  sulfamethoxazole-trimethoprim (BACTRIM) 400-80 MG tablet, Take 1 tablet by mouth 2 (two) times daily. Twice daily for folliculitis, Disp: 14 tablet, Rfl: 0 .  tiZANidine (ZANAFLEX) 4 MG tablet, as needed. , Disp: , Rfl:  .  traZODone (DESYREL) 50 MG tablet, Take 50 mg by mouth at bedtime., Disp: , Rfl:  .  triamcinolone cream (KENALOG) 0.5 %, As needed, Disp: , Rfl:  .  venlafaxine XR (EFFEXOR-XR) 150 MG 24 hr capsule, Take 150 mg by mouth daily with breakfast., Disp: , Rfl:   Social History   Tobacco Use  Smoking Status Never Smoker  Smokeless Tobacco Never Used    Allergies  Allergen Reactions  . Doxycycline Nausea And Vomiting    Cannot tolerate oral doxycycline   . Flagyl [Metronidazole] Itching  . Latex Itching  . Penicillins Itching  Has patient had a PCN reaction causing immediate rash, facial/tongue/throat swelling, SOB or lightheadedness with hypotension: NO Has patient had a PCN reaction causing severe rash involving mucus membranes or skin necrosis: No Has patient had a PCN reaction that required hospitalization No Has patient had a PCN reaction occurring within the last 10 years: No If all of the above answers are "NO", then may proceed with Cephalosporin use.    Objective:  There were no vitals filed for this visit. There is no height or weight on file to calculate BMI. Constitutional Well developed. Well nourished.  Vascular Dorsalis pedis pulses palpable bilaterally. Posterior tibial pulses palpable bilaterally. Capillary refill normal to all digits.  No cyanosis or clubbing noted. Pedal hair growth normal.   Neurologic Normal speech. Oriented to person, place, and time. Epicritic sensation to light touch grossly present bilaterally.  Dermatologic Nails well groomed and normal in appearance. No open wounds. No skin lesions.  Orthopedic:  Pain on palpation right lateral ankle.  Pain with resisted dorsiflexion eversion pain with plantarflexion inversion of the foot.  No pain at the Achilles tendon, posterior tibial tendon.  Mild pain along the peroneal tendons.  Pain at the ATFL ligament.  Swelling noted.  Nonpitting.   Radiographs: 3 views of skeletally mature adult right foot: No fractures noted.  Mild edema noted on the ankle joint.  No osseous break noted.  No other osseous abnormality noted.  Mild midfoot Oster osteoarthritis noted. Assessment:   1. Sprain of anterior talofibular ligament of right ankle, initial encounter    Plan:  Patient was evaluated and treated and all questions answered.  Right ankle sprain moderate -I explained the patient the etiology of ankle sprain versus treatment options were discussed.  Given the amount of pain she is having I have asked her to place her self in the cam boot.  She already has a cam boot at home she will go immediately at home and place her self back in the boot.  If there is no resolve meant of pain will discuss getting an MRI for possible ATFL ligament tear versus steroid injection.  Patient states understanding. -Patient was placed back in the cam boot  No follow-ups on file.

## 2020-11-23 ENCOUNTER — Other Ambulatory Visit: Payer: Self-pay

## 2020-11-23 ENCOUNTER — Ambulatory Visit (INDEPENDENT_AMBULATORY_CARE_PROVIDER_SITE_OTHER): Payer: Medicaid Other | Admitting: Podiatry

## 2020-11-23 DIAGNOSIS — L603 Nail dystrophy: Secondary | ICD-10-CM

## 2020-11-23 DIAGNOSIS — L600 Ingrowing nail: Secondary | ICD-10-CM

## 2020-11-24 ENCOUNTER — Encounter: Payer: Self-pay | Admitting: Podiatry

## 2020-11-24 NOTE — Progress Notes (Signed)
Subjective:  Patient ID: Brooke Eaton, female    DOB: 03-18-1976,  MRN: [id number]  Chief Complaint  Patient presents with  . Nail Problem    Left hallux nail problem     45 y.o. female presents with the above complaint.  Patient presents with complaint of left lateral border ingrown.  Patient states is painful to touch.  She had a previous right side removed in the past.  She would like to have it removed on the left side as well.  She states is painful to touch is sore she has not been able to wear certain shoes.  She denies any other acute complaints.   Review of Systems: Negative except as noted in the HPI. Denies N/V/F/Ch.  Past Medical History:  Diagnosis Date  . Acid reflux   . Allergy   . Anxiety   . Anxiety and depression   . Arthritis   . Asthma   . Depression   . Fibroid   . Fibromyalgia   . Fibromyalgia   . Heart murmur   . Irritable bowel syndrome   . Migraines   . 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 Plantar fasciitis   . PONV (postoperative nausea and vomiting)   . Post-traumatic stress disorder   . PTSD (post-traumatic stress disorder)   . Ruptured cyst of ovary 2001  . Ruptured cyst of ovary   . Substance abuse ([place])     Current Outpatient Medications:  .  albuterol (ACCUNEB) 0.63 MG/3ML nebulizer solution, Take 1 ampule by nebulization as needed for wheezing., Disp: , Rfl:  .  ALBUTEROL IN, Inhale into the lungs as needed., Disp: , Rfl:  .  cholecalciferol (VITAMIN D3) 25 MCG (1000 UNIT) tablet, Take 1,000 Units by mouth daily., Disp: , Rfl:  .  ELDERBERRY PO, Take by mouth., Disp: , Rfl:  .  esomeprazole (NEXIUM) 40 MG capsule, Take 1 capsule (40 mg total) by mouth 2 (two) times daily before a meal., Disp: 180 capsule, Rfl: 3 .  naproxen (NAPROSYN) 500 MG tablet, Take 500 mg by mouth daily. , Disp: , Rfl:  .  ondansetron (ZOFRAN ODT) 4 MG disintegrating tablet, Take 1 tablet (4 mg total) by mouth every 8 (eight) hours as needed for nausea or vomiting., Disp: 20  tablet, Rfl: 2 .  pregabalin (LYRICA) 75 MG capsule, Take 150 mg by mouth 3 (three) times daily. , Disp: , Rfl:  .  sulfamethoxazole-trimethoprim (BACTRIM) 400-80 MG tablet, Take 1 tablet by mouth 2 (two) times daily. Twice daily for folliculitis, Disp: 14 tablet, Rfl: 0 .  tiZANidine (ZANAFLEX) 4 MG tablet, as needed. , Disp: , Rfl:  .  traZODone (DESYREL) 50 MG tablet, Take 50 mg by mouth at bedtime., Disp: , Rfl:  .  triamcinolone cream (KENALOG) 0.5 %, As needed, Disp: , Rfl:  .  venlafaxine XR (EFFEXOR-XR) 150 MG 24 hr capsule, Take 150 mg by mouth daily with breakfast., Disp: , Rfl:   Social History   Tobacco Use  Smoking Status Never Smoker  Smokeless Tobacco Never Used    Allergies  Allergen Reactions  . Doxycycline Nausea And Vomiting    Cannot tolerate oral doxycycline   . Flagyl [Metronidazole] Itching  . Latex Itching  . Penicillins Itching    Has patient had a PCN reaction causing immediate rash, facial/tongue/throat swelling, SOB or lightheadedness with hypotension: NO Has patient had a PCN reaction causing severe rash involving mucus membranes or skin necrosis: No Has patient had a PCN reaction  that required hospitalization No Has patient had a PCN reaction occurring within the last 10 years: No If all of the above answers are "NO", then may proceed with Cephalosporin use.    Objective:  There were no vitals filed for this visit. There is no height or weight on file to calculate BMI. Constitutional Well developed. Well nourished.  Vascular Dorsalis pedis pulses palpable bilaterally. Posterior tibial pulses palpable bilaterally. Capillary refill normal to all digits.  No cyanosis or clubbing noted. Pedal hair growth normal.  Neurologic Normal speech. Oriented to person, place, and time. Epicritic sensation to light touch grossly present bilaterally.  Dermatologic Painful ingrowing nail at lateral nail borders of the hallux nail left. No other open wounds. No  skin lesions.  Orthopedic: Normal joint ROM without pain or crepitus bilaterally. No visible deformities. No bony tenderness.   Radiographs: None Assessment:   1. Nail dystrophy   2. Ingrown left big toenail    Plan:  Patient was evaluated and treated and all questions answered.  Ingrown Nail, left with underlying nail dystrophy -Patient elects to proceed with minor surgery to remove ingrown toenail removal today. Consent reviewed and signed by patient. -Ingrown nail excised. See procedure note. -Educated on post-procedure care including soaking. Written instructions provided and reviewed. -Patient to follow up in 2 weeks for nail check.  Procedure: Excision of Ingrown Toenail Location: Left 1st toe lateral nail borders. Anesthesia: Lidocaine 1% plain; 1.5 mL and Marcaine 0.5% plain; 1.5 mL, digital block. Skin Prep: Betadine. Dressing: Silvadene; telfa; dry, sterile, compression dressing. Technique: Following skin prep, the toe was exsanguinated and a tourniquet was secured at the base of the toe. The affected nail border was freed, split with a nail splitter, and excised. Chemical matrixectomy was then performed with phenol and irrigated out with alcohol. The tourniquet was then removed and sterile dressing applied. Disposition: Patient tolerated procedure well. Patient to return in 2 weeks for follow-up.   No follow-ups on file.

## 2020-12-13 ENCOUNTER — Ambulatory Visit: Payer: Medicaid Other | Admitting: Podiatry

## 2020-12-20 ENCOUNTER — Ambulatory Visit: Payer: Medicaid Other | Admitting: Podiatry

## 2020-12-27 ENCOUNTER — Encounter: Payer: Self-pay | Admitting: Women's Health

## 2020-12-27 ENCOUNTER — Ambulatory Visit (INDEPENDENT_AMBULATORY_CARE_PROVIDER_SITE_OTHER): Payer: Medicaid Other | Admitting: Women's Health

## 2020-12-27 ENCOUNTER — Other Ambulatory Visit: Payer: Self-pay

## 2020-12-27 ENCOUNTER — Other Ambulatory Visit (HOSPITAL_COMMUNITY)
Admission: RE | Admit: 2020-12-27 | Discharge: 2020-12-27 | Disposition: A | Discharge status: Home / Self Care | Payer: Medicaid Other | Source: Ambulatory Visit | Attending: Obstetrics & Gynecology | Admitting: Obstetrics & Gynecology

## 2020-12-27 VITALS — BP 119/80 | HR 81 | Ht 66.0 in | Wt 230.0 lb

## 2020-12-27 DIAGNOSIS — N9089 Other specified noninflammatory disorders of vulva and perineum: Secondary | ICD-10-CM | POA: Diagnosis not present

## 2020-12-27 DIAGNOSIS — B372 Candidiasis of skin and nail: Secondary | ICD-10-CM

## 2020-12-27 NOTE — Progress Notes (Signed)
   GYN VISIT Patient name: Brooke Eaton MRN [id number]  Date of birth: 12-19-1975 Chief Complaint:   No chief complaint on file.  History of Present Illness:   Brooke Eaton is a 45 y.o. [id number] African American female being seen today for vulvar irritation noted today after shower. Denies abnormal discharge, itching/odor. Not sexually active in >16yr.       Depression screen [facility] 2/9 10/18/2020 01/13/2018 12/18/2017 11/14/2017 10/23/2017  Decreased Interest 0 0 0 2 1  Down, Depressed, Hopeless 2 0 0 1 1  PHQ - 2 Score 2 0 0 3 2  Altered sleeping 1 - - 3 3  Tired, decreased energy 2 - - 3 1  Change in appetite 1 - - 1 0  Feeling bad or failure about yourself  3 - - 3 2  Trouble concentrating 0 - - 1 2  Moving slowly or fidgety/restless 2 - - 1 0  Suicidal thoughts 0 - - 1 1  PHQ-9 Score 11 - - 16 11  Difficult doing work/chores - - - Extremely dIfficult Somewhat difficult    No LMP recorded. Patient has had an ablation. The current method of family planning is abstinence.  Last pap 10/18/20. Results were: NILM w/ HRHPV negative Review of Systems:   Pertinent items are noted in HPI Denies fever/chills, dizziness, headaches, visual disturbances, fatigue, shortness of breath, chest pain, abdominal pain, vomiting, abnormal vaginal discharge/itching/odor/irritation, problems with periods, bowel movements, urination, or intercourse unless otherwise stated above.  Pertinent History Reviewed:  Reviewed past medical,surgical, social, obstetrical and family history.  Reviewed problem list, medications and allergies. Physical Assessment:   Vitals:   12/27/20 1552  BP: 119/80  Pulse: 81  Weight: 230 lb (104.3 kg)  Height: 5\' 6"  (1.676 m)  Body mass index is 37.12 kg/m.       Physical Examination:   General appearance: alert, well appearing, and in no distress  Mental status: alert, oriented to person, place, and time  Skin: warm & dry   Cardiovascular: normal heart rate noted  Respiratory:  normal respiratory effort, no distress  Abdomen: soft, non-tender   Pelvic: VULVA: erythematous entire vulva and into Rt thigh crease c/w yeast- painted w/ gentian violet, VAGINA: normal appearing vagina with normal color and discharge, no lesions, CERVIX: normal appearing cervix without discharge or lesions  Extremities: no edema   Chaperone: Latisha Cresenzo    No results found for this or any previous visit (from the past 24 hour(s)).  Assessment & Plan:  1) Vulvar skin candida> painted w/ gentian violet, CV swab sent  Meds: No orders of the defined types were placed in this encounter.   No orders of the defined types were placed in this encounter.   No follow-ups on file.  [place], [facility] 12/27/2020 4:01 PM

## 2020-12-28 ENCOUNTER — Other Ambulatory Visit: Payer: Self-pay | Admitting: Internal Medicine

## 2020-12-29 LAB — CERVICOVAGINAL ANCILLARY ONLY
Bacterial Vaginitis (gardnerella): NEGATIVE
Candida Glabrata: NEGATIVE
Candida Vaginitis: POSITIVE — AB
Chlamydia: NEGATIVE
Comment: NEGATIVE
Comment: NEGATIVE
Comment: NEGATIVE
Comment: NEGATIVE
Comment: NEGATIVE
Comment: NORMAL
Neisseria Gonorrhea: NEGATIVE
Trichomonas: NEGATIVE

## 2020-12-29 LAB — C. TRACHOMATIS/N. GONORRHOEAE RNA
C. trachomatis RNA, TMA: NOT DETECTED
N. gonorrhoeae RNA, TMA: NOT DETECTED

## 2020-12-29 MED ORDER — FLUCONAZOLE 150 MG PO TABS: 150.0000 mg | ORAL_TABLET | Freq: Once | ORAL | 0 refills | Status: AC

## 2020-12-29 NOTE — Addendum Note (Signed)
Addended by: Roma Schanz on: 12/29/2020 02:18 PM   Modules accepted: Orders

## 2021-01-18 ENCOUNTER — Encounter: Payer: Self-pay | Admitting: Internal Medicine

## 2021-01-18 ENCOUNTER — Telehealth: Payer: Self-pay

## 2021-01-18 MED ORDER — FAMOTIDINE 40 MG PO TABS: 40.0000 mg | ORAL_TABLET | Freq: Every day | ORAL | 0 refills | Status: DC

## 2021-01-18 MED ORDER — ESOMEPRAZOLE MAGNESIUM 40 MG PO CPDR: 40.0000 mg | DELAYED_RELEASE_CAPSULE | Freq: Two times a day (BID) | ORAL | 3 refills | Status: DC

## 2021-01-18 NOTE — Telephone Encounter (Signed)
She should not be on more then Nexium 40mg  BID before breakfast and before evening meal. I will send in new RX.   Will also send in RX for RX for pepcid 40mg  at lunch.  Looks like letter went out today for her appointment to be switched October.  Lets add her to the cancellation list as well.

## 2021-01-18 NOTE — Telephone Encounter (Signed)
Pt called office, she has been out of Nexium for 5 days but pharmacy will not give her anymore until 01/27/21. Said they don't have med updated in their system since dose was increased. She is requesting rx for Nexium 40mg  BID 90 day supply be sent to [facility].

## 2021-01-18 NOTE — Addendum Note (Signed)
Addended by: Mahala Menghini on: 01/18/2021 03:21 PM   Modules accepted: Orders

## 2021-01-18 NOTE — Telephone Encounter (Signed)
I returned the pt's call and was advised by her that the pharmacy need a new Rx written for increase of her Nexium. I asked her how she was taking the pills she states she takes 2 pills twice a day. I advised the pt she was suppose to be taking 1 pill twice a day. She stated to me that one pill twice a day wasn't helping. I advised her that she would need a office visit to be re-evaluated because there must be something going on with her. She advised she has a appt for August but I advised her it was October 28th and she said no one told her that her appt had been changed. I also advised her that she could purchase some OTC meds to help until she gets Rx filled and the pt stated that was not an option to pay out of pocket. Please advise

## 2021-01-19 ENCOUNTER — Ambulatory Visit
Admission: RE | Admit: 2021-01-19 | Discharge: 2021-01-19 | Disposition: A | Discharge status: Home / Self Care | Payer: Medicaid Other | Source: Ambulatory Visit | Attending: Internal Medicine | Admitting: Internal Medicine

## 2021-01-19 ENCOUNTER — Other Ambulatory Visit: Payer: Self-pay

## 2021-01-19 DIAGNOSIS — Z1231 Encounter for screening mammogram for malignant neoplasm of breast: Secondary | ICD-10-CM

## 2021-01-19 NOTE — Telephone Encounter (Signed)
Phoned the pt and her mailbox was full. 

## 2021-01-22 ENCOUNTER — Other Ambulatory Visit: Payer: Self-pay | Admitting: Internal Medicine

## 2021-01-22 DIAGNOSIS — R928 Other abnormal and inconclusive findings on diagnostic imaging of breast: Secondary | ICD-10-CM

## 2021-01-22 NOTE — Telephone Encounter (Signed)
Phoned the pt and her mailbox was full. 

## 2021-01-23 NOTE — Telephone Encounter (Signed)
Letter sent out to pt to contact the office regarding medication.

## 2021-01-30 ENCOUNTER — Telehealth: Payer: Self-pay | Admitting: Internal Medicine

## 2021-01-30 NOTE — Telephone Encounter (Signed)
Spoke to pt.  She said that she went and picked up RX for Nexium.  She said that Pepcid was not working.  Asked pt if she would like to see if there was an alternative medication that she could take at her lunch meal.  Pt declined and said she wanted to leave everything like it is just taking the Nexium twice a day.  Said that she was happy with Nexium but wanted [place] to know she is not going to take Pepcid any longer because it made her throw up.  Routing to Neil Crouch, [place] as Juluis Rainier.

## 2021-01-30 NOTE — Telephone Encounter (Signed)
PATIENT CALLED AND SAID THE REFLUX MEDICATION WE CHANGED HER TO IS NOT WORKING AND SHE WANTS THE PRESCRIPTION SHE USED TO TAKE SENT IN

## 2021-02-01 NOTE — Telephone Encounter (Signed)
Noted. She has upcoming OV.

## 2021-02-14 ENCOUNTER — Other Ambulatory Visit: Payer: Medicaid Other

## 2021-03-02 ENCOUNTER — Ambulatory Visit
Admission: RE | Admit: 2021-03-02 | Discharge: 2021-03-02 | Disposition: A | Discharge status: Home / Self Care | Payer: Medicaid Other | Source: Ambulatory Visit | Attending: Internal Medicine | Admitting: Internal Medicine

## 2021-03-02 ENCOUNTER — Other Ambulatory Visit: Payer: Self-pay

## 2021-03-02 DIAGNOSIS — R928 Other abnormal and inconclusive findings on diagnostic imaging of breast: Secondary | ICD-10-CM

## 2021-03-05 ENCOUNTER — Ambulatory Visit: Payer: Medicaid Other | Admitting: Gastroenterology

## 2021-05-01 ENCOUNTER — Other Ambulatory Visit (HOSPITAL_COMMUNITY)
Admission: RE | Admit: 2021-05-01 | Discharge: 2021-05-01 | Disposition: A | Discharge status: Home / Self Care | Payer: Medicaid Other | Source: Ambulatory Visit | Attending: Obstetrics & Gynecology | Admitting: Obstetrics & Gynecology

## 2021-05-01 ENCOUNTER — Other Ambulatory Visit (INDEPENDENT_AMBULATORY_CARE_PROVIDER_SITE_OTHER): Payer: Medicaid Other

## 2021-05-01 ENCOUNTER — Other Ambulatory Visit: Payer: Self-pay

## 2021-05-01 DIAGNOSIS — Z113 Encounter for screening for infections with a predominantly sexual mode of transmission: Secondary | ICD-10-CM | POA: Insufficient documentation

## 2021-05-01 NOTE — Progress Notes (Signed)
   NURSE VISIT- VAGINITIS/STD  SUBJECTIVE:  Brooke Eaton is a 45 y.o. [id number] GYN patient female here for a vaginal swab for vaginitis screening, STD screen following partner infidelity.  She reports the following symptoms: none. Denies abnormal vaginal bleeding, significant pelvic pain, fever, or UTI symptoms.  OBJECTIVE:  There were no vitals taken for this visit.  Appears well, in no apparent distress  ASSESSMENT: Vaginal swab for  vaginitis & STD screening  PLAN: Self-collected vaginal probe for Gonorrhea, Chlamydia, Trichomonas, Bacterial Vaginosis, Yeast sent to lab Treatment: to be determined once results are received Follow-up as needed if symptoms persist/worsen, or new symptoms develop  Jadalee Westcott A Kashawna Manzer  05/01/2021 10:40 AM

## 2021-05-01 NOTE — Progress Notes (Signed)
Chart reviewed for nurse visit. Agree with plan of care.  Estill Dooms, NP 05/01/2021 12:10 PM

## 2021-05-02 ENCOUNTER — Other Ambulatory Visit: Payer: Self-pay | Admitting: Adult Health

## 2021-05-02 LAB — HEPATITIS C ANTIBODY: Hep C Virus Ab: <0.1 s/co ratio (ref 0.0–0.9)

## 2021-05-02 LAB — CERVICOVAGINAL ANCILLARY ONLY
Bacterial Vaginitis (gardnerella): POSITIVE — AB
Candida Glabrata: NEGATIVE
Candida Vaginitis: NEGATIVE
Chlamydia: NEGATIVE
Comment: NEGATIVE
Comment: NEGATIVE
Comment: NEGATIVE
Comment: NEGATIVE
Comment: NEGATIVE
Comment: NORMAL
Neisseria Gonorrhea: NEGATIVE
Trichomonas: NEGATIVE

## 2021-05-02 LAB — HIV ANTIBODY (ROUTINE TESTING W REFLEX): HIV Screen 4th Generation wRfx: NONREACTIVE

## 2021-05-02 LAB — RPR: RPR Ser Ql: NONREACTIVE

## 2021-05-02 MED ORDER — CLINDAMYCIN HCL 300 MG PO CAPS: 300.0000 mg | ORAL_CAPSULE | Freq: Three times a day (TID) | ORAL | 0 refills | Status: DC

## 2021-05-02 NOTE — Progress Notes (Signed)
+  BV on vaginal swab rx clindamycin 300 mg 1 tid 7 days

## 2021-06-01 ENCOUNTER — Ambulatory Visit: Payer: Medicaid Other | Admitting: Gastroenterology

## 2021-06-12 ENCOUNTER — Encounter: Payer: Self-pay | Admitting: Internal Medicine

## 2021-07-06 ENCOUNTER — Ambulatory Visit: Payer: Medicaid Other | Admitting: Gastroenterology

## 2021-07-25 NOTE — Progress Notes (Signed)
Referring Provider: Pa, [facility] Primary Care Physician:  Nolene Ebbs, MD Primary GI Physician: Dr. Abbey Chatters  Chief Complaint  Patient presents with   Gastroesophageal Reflux    F/u doing okay    HPI:   Brooke Eaton is a 45 y.o. female presenting today for follow-up.  She has history of GERD, Schatzki's ring in 2018 s/p dilation, epigastric pain, IBS-D previously.  Colonoscopy in April 2018 with redundant colon, pancolonic diverticulosis, internal hemorrhoids, negative random colon biopsies, recommended repeat in 10 years.  EGD also completed April 2018 with nonobstructing Schatzki's ring s/p dilation, mild gastritis biopsied, large duodenal diverticulum involving ampulla, s/p duodenal biopsies.  Duodenal biopsies benign, gastric biopsies with chronic inflammation, intestinal metaplasia, no H. pylori.  Prior CT A/P July 2020 with no findings to explain epigastric pain, nausea, vomiting.  She reported improvement in her symptoms at that time with drinking 2% milk or lactose-free milk.  Regarding GERD management, she has previous trials of various PPIs including Prevacid (caused diarrhea), Nexium (helpful), omeprazole (not helpful).  Last seen in our office 10/12/2019.  Reported improvement in GERD symptoms since increasing Nexium to twice daily.  She did have some constipation, but this was improved with diet.  Denied nausea, vomiting, abdominal pain, overt GI bleeding.  Advised to continue her current medications, encouraged weight loss which she was working on with her PCP, and follow-up in 1 year.  Today: GERD is well controlled with Nexium 40 mg daily.  Denies abdominal pain, nausea, vomiting.  She has noticed some trouble with the action of swallowing over the last year.  Feels like she has to move her head a certain way and forced herself to swallow.  This can occur when eating, drinking, or without anything in her mouth and swallowing her saliva.  One she swallows, food and  liquids will go down fine. No sore throat or swelling.  Denies unintentional weight loss, brbpr, or melena.  Bowels are moving well.  Past Medical History:  Diagnosis Date   Acid reflux    Allergy    Anxiety    Anxiety and depression    Arthritis    Asthma    Depression    Fibroid    Fibromyalgia    Fibromyalgia    Heart murmur    Irritable bowel syndrome    Migraines    Osteitis pubis (HCC)    Plantar fasciitis    PONV (postoperative nausea and vomiting)    Post-traumatic stress disorder    PTSD (post-traumatic stress disorder)    Ruptured cyst of ovary 2001   Ruptured cyst of ovary    Substance abuse ([place])     Past Surgical History:  Procedure Laterality Date   ADENOIDECTOMY     COLONOSCOPY N/A 11/07/2016   Surgeon: Danie Binder, MD; edundant colon, pancolonic diverticulosis, internal hemorrhoids, negative random colon biopsies, recommended repeat in 10 years.   DILATION AND CURETTAGE OF UTERUS     DILITATION & CURRETTAGE/HYSTROSCOPY WITH NOVASURE ABLATION N/A 10/01/2016   Procedure: DILATATION & CURETTAGE/HYSTEROSCOPY WITH NOVASURE ABLATION AND EXCISION OF VULVAR/GLUTEAL  CYST;  Surgeon: Jonnie Kind, MD;  Location: [facility];  Service: Gynecology;  Laterality: N/A;   ESOPHAGOGASTRODUODENOSCOPY N/A 11/07/2016   Surgeon: Danie Binder, MD;  nonobstructing Schatzki's ring s/p dilation, mild gastritis biopsied, large duodenal diverticulum involving ampulla, s/p duodenal biopsies.  Duodenal biopsies benign, gastric biopsies with chronic inflammation, intestinal metaplasia, no H. pylori.   SAVORY DILATION N/A 11/07/2016   Procedure:  SAVORY DILATION;  Surgeon: Danie Binder, MD;  Location: [facility];  Service: Endoscopy;  Laterality: N/A;   TONSILLECTOMY     TUBAL LIGATION  2016   WISDOM TOOTH EXTRACTION      Current Outpatient Medications  Medication Sig Dispense Refill   albuterol (ACCUNEB) 0.63 MG/3ML nebulizer solution Take 1 ampule by nebulization as needed  for wheezing.     ALBUTEROL IN Inhale into the lungs as needed.     pregabalin (LYRICA) 75 MG capsule Take 150 mg by mouth 3 (three) times daily.      tiZANidine (ZANAFLEX) 4 MG tablet as needed.      traZODone (DESYREL) 50 MG tablet Take 50 mg by mouth at bedtime as needed.     triamcinolone cream (KENALOG) 0.5 % As needed     venlafaxine XR (EFFEXOR-XR) 150 MG 24 hr capsule Take 150 mg by mouth daily with breakfast. As needed     esomeprazole (NEXIUM) 40 MG capsule Take 1 capsule (40 mg total) by mouth daily before breakfast. 90 capsule 3   No current facility-administered medications for this visit.    Allergies as of 07/26/2021 - Review Complete 07/26/2021  Allergen Reaction Noted   Doxycycline Nausea And Vomiting 10/27/2011   Flagyl [metronidazole] Itching 01/17/2012   Latex Itching 10/27/2011   Penicillins Itching 10/27/2011    Family History  Problem Relation Age of Onset   Emphysema Maternal Grandmother    Gout Maternal Grandmother    Diabetes Maternal Grandmother    Gout Father    Alcohol abuse Father    Arthritis Father    COPD Father    Depression Father    Drug abuse Father    Hyperlipidemia Father    Hypertension Father    Gout Mother    Arthritis Mother        RA   Diabetes Mother    Depression Mother    Heart disease Mother    Hyperlipidemia Mother    Hypertension Mother    Vision loss Mother    Thyroid disease Maternal Aunt    Diabetes Maternal Uncle    Breast cancer Other    Breast cancer Paternal Aunt 21   Breast cancer Paternal Aunt 27   Colon cancer Neg Hx    Inflammatory bowel disease Neg Hx    Celiac disease Neg Hx     Social History   Socioeconomic History   Marital status: Single    Spouse name: n/a   Number of children: 1   Years of education: 13   Highest education level: Not on file  Occupational History   Not on file  Tobacco Use   Smoking status: Never   Smokeless tobacco: Never  Vaping Use   Vaping Use: Never used   Substance and Sexual Activity   Alcohol use: Not Currently    Comment: WINE OCCASIONAL   Drug use: Yes    Types: Marijuana    Comment: daily   Sexual activity: Not Currently    Birth control/protection: Surgical    Comment: tubal and ablation  Other Topics Concern   Not on file  Social History Narrative   Single parent   One daughter - almost 2   Social Determinants of Health   Financial Resource Strain: High Risk   Difficulty of Paying Living Expenses: Very hard  Food Insecurity: No Food Insecurity   Worried About Charity fundraiser in the Last Year: Never true   Arboriculturist in  the Last Year: Never true  Transportation Needs: No Transportation Needs   Lack of Transportation (Medical): No   Lack of Transportation (Non-Medical): No  Physical Activity: Inactive   Days of Exercise per Week: 0 days   Minutes of Exercise per Session: 0 min  Stress: Stress Concern Present   Feeling of Stress : To some extent  Social Connections: Socially Isolated   Frequency of Communication with Friends and Family: More than three times a week   Frequency of Social Gatherings with Friends and Family: More than three times a week   Attends Religious Services: Never   Marine scientist or Organizations: No   Attends Music therapist: Never   Marital Status: Never married    Review of Systems: Gen: Denies fever, chills, cold or flulike symptoms, presyncope, syncope. CV: Denies chest pain, palpitations. Resp: Denies dyspnea or cough. GI: See HPI Heme: See HPI  Physical Exam: BP 124/87    Pulse 80    Temp (!) 97.1 F (36.2 C)    Ht 5\' 6"  (1.676 m)    Wt 238 lb 12.8 oz (108.3 kg)    BMI 38.54 kg/m  General:   Alert and oriented. No distress noted. Pleasant and cooperative.  Head:  Normocephalic and atraumatic. Eyes:  Conjuctiva clear without scleral icterus. Mouth: Oropharynx without erythema, edema, masses, or asymmetry, s/p tonsillectomy.  Neck: No appreciable  lymphadenopathy or thyromegaly.  Heart:  S1, S2 present without murmurs appreciated. Lungs:  Clear to auscultation bilaterally. No wheezes, rales, or rhonchi. No distress.  Abdomen:  +BS, soft, non-tender and non-distended. No rebound or guarding. No HSM or masses noted. Msk:  Symmetrical without gross deformities. Normal posture. Extremities:  Without edema. Neurologic:  Alert and  oriented x4 Psych:  Normal mood and affect.    Assessment: 45 year old female with history of GERD, Schatzki's ring in 2018 s/p dilation, suspected IBS/D previously, but symptoms resolved and now with normal bowel habits, presenting today for 1 year follow-up on GERD.  Also discussed dysphagia.  GERD: Well-controlled on Nexium 40 mg daily.    Dysphagia: Symptoms seem more consistent with oropharyngeal dysphagia.  Patient describes trouble with the action of swallowing, having to move her head a certain way to make herself swallow which can occur when eating, drinking, or when just swallowing her saliva. Symptoms started within the last year. Once she swallows, foods, liquids, and saliva go down without a problem.  She does have history of a Schatzki's ring that was dilated in 2018, but it does not sound like she is having esophageal dysphagia.  Denies unintentional weight loss, odynophagia, sore throat, or swelling.  On exam, she had no significant lymphadenopathy or thyromegaly, and no significant abnormalities in the oropharynx, s/p tonsillectomy.  We will plan for modified barium swallow with barium pill esophagram for further evaluation.   Plan: MBSS with BPE Continue Nexium 40 mg daily.  New prescription sent to pharmacy. Reinforced GERD diet/lifestyle.  Separate written instructions provided. Follow-up in 1 year or sooner if needed pending swallowing study results.   Aliene Altes, PA-C [facility] 07/26/2021

## 2021-07-26 ENCOUNTER — Other Ambulatory Visit: Payer: Self-pay

## 2021-07-26 ENCOUNTER — Encounter: Payer: Self-pay | Admitting: Gastroenterology

## 2021-07-26 ENCOUNTER — Ambulatory Visit: Payer: Medicaid Other | Admitting: Gastroenterology

## 2021-07-26 ENCOUNTER — Other Ambulatory Visit (HOSPITAL_COMMUNITY): Payer: Self-pay | Admitting: Specialist

## 2021-07-26 VITALS — BP 124/87 | HR 80 | Temp 97.1°F | Ht 66.0 in | Wt 238.8 lb

## 2021-07-26 DIAGNOSIS — K219 Gastro-esophageal reflux disease without esophagitis: Secondary | ICD-10-CM | POA: Diagnosis not present

## 2021-07-26 DIAGNOSIS — R1312 Dysphagia, oropharyngeal phase: Secondary | ICD-10-CM | POA: Diagnosis not present

## 2021-07-26 MED ORDER — ESOMEPRAZOLE MAGNESIUM 40 MG PO CPDR: 40.0000 mg | DELAYED_RELEASE_CAPSULE | Freq: Every day | ORAL | 3 refills | Status: DC

## 2021-07-26 NOTE — Patient Instructions (Addendum)
We will place a referral for you to have a swallowing study in the near future.   Continue taking Nexium 40 mg daily 30 minutes before breakfast.  I have sent a new prescription to your pharmacy for 40 mg capsules rather than 20 mg capsules, so you only need to take 1 each morning.  Follow a GERD diet:  Avoid fried, fatty, greasy, spicy, citrus foods. Avoid caffeine and carbonated beverages. Avoid chocolate. Try eating 4-6 small meals a day rather than 3 large meals. Do not eat within 3 hours of laying down. Prop head of bed up on wood or bricks to create a 6 inch incline.  We will plan to follow-up with you in 1 year or sooner if needed based on your swallowing study results.  Do not hesitate to call if you have any questions or concerns prior to next visit.   It was a pleasure to meet you today!  I hope you have a very Merry Christmas and Happy New Year!  Aliene Altes, PA-C [facility]

## 2021-08-07 ENCOUNTER — Other Ambulatory Visit: Payer: Self-pay | Admitting: *Deleted

## 2021-08-07 ENCOUNTER — Telehealth: Payer: Self-pay | Admitting: *Deleted

## 2021-08-07 DIAGNOSIS — R1312 Dysphagia, oropharyngeal phase: Secondary | ICD-10-CM

## 2021-08-07 NOTE — Telephone Encounter (Signed)
Called pt. She is aware of BPE appointment details. She voiced understanding.

## 2021-08-09 ENCOUNTER — Other Ambulatory Visit (HOSPITAL_COMMUNITY): Payer: Medicaid Other

## 2021-08-09 ENCOUNTER — Ambulatory Visit (HOSPITAL_COMMUNITY): Payer: Medicaid Other | Admitting: Speech Pathology

## 2021-08-16 ENCOUNTER — Ambulatory Visit (HOSPITAL_COMMUNITY): Payer: Medicare Other | Attending: Gastroenterology | Admitting: Speech Pathology

## 2021-08-16 ENCOUNTER — Ambulatory Visit (HOSPITAL_COMMUNITY)
Admission: RE | Admit: 2021-08-16 | Discharge: 2021-08-16 | Disposition: A | Discharge status: Home / Self Care | Payer: Medicare Other | Source: Ambulatory Visit | Attending: Gastroenterology | Admitting: Gastroenterology

## 2021-08-16 ENCOUNTER — Encounter (HOSPITAL_COMMUNITY): Payer: Self-pay | Admitting: Speech Pathology

## 2021-08-16 ENCOUNTER — Other Ambulatory Visit: Payer: Self-pay

## 2021-08-16 DIAGNOSIS — R1312 Dysphagia, oropharyngeal phase: Secondary | ICD-10-CM | POA: Insufficient documentation

## 2021-08-16 NOTE — Therapy (Signed)
[place], [place], [address] Phone: [phone]   Fax:  [phone]  Modified Barium Swallow  Patient Details  Name: Brooke Eaton MRN: [id number] Date of Birth: 1975-12-14 No data recorded  Encounter Date: 08/16/2021   End of Session - 08/16/21 1359     Visit Number 1    Number of Visits 1    Authorization Type Medicare    SLP Start Time 1140    SLP Stop Time  8588    SLP Time Calculation (min) 25 min    Activity Tolerance Patient tolerated treatment well             Past Medical History:  Diagnosis Date   Acid reflux    Allergy    Anxiety    Anxiety and depression    Arthritis    Asthma    Depression    Fibroid    Fibromyalgia    Fibromyalgia    Heart murmur    Irritable bowel syndrome    Migraines    Osteitis pubis (HCC)    Plantar fasciitis    PONV (postoperative nausea and vomiting)    Post-traumatic stress disorder    PTSD (post-traumatic stress disorder)    Ruptured cyst of ovary 2001   Ruptured cyst of ovary    Substance abuse ([place])     Past Surgical History:  Procedure Laterality Date   ADENOIDECTOMY     COLONOSCOPY N/A 11/07/2016   Surgeon: Danie Binder, MD; edundant colon, pancolonic diverticulosis, internal hemorrhoids, negative random colon biopsies, recommended repeat in 10 years.   DILATION AND CURETTAGE OF UTERUS     DILITATION & CURRETTAGE/HYSTROSCOPY WITH NOVASURE ABLATION N/A 10/01/2016   Procedure: DILATATION & CURETTAGE/HYSTEROSCOPY WITH NOVASURE ABLATION AND EXCISION OF VULVAR/GLUTEAL  CYST;  Surgeon: Jonnie Kind, MD;  Location: [facility];  Service: Gynecology;  Laterality: N/A;   ESOPHAGOGASTRODUODENOSCOPY N/A 11/07/2016   Surgeon: Danie Binder, MD;  nonobstructing Schatzki's ring s/p dilation, mild gastritis biopsied, large duodenal diverticulum involving ampulla, s/p duodenal biopsies.  Duodenal biopsies benign, gastric biopsies with chronic inflammation,  intestinal metaplasia, no H. pylori.   SAVORY DILATION N/A 11/07/2016   Procedure: SAVORY DILATION;  Surgeon: Danie Binder, MD;  Location: [facility];  Service: Endoscopy;  Laterality: N/A;   TONSILLECTOMY     TUBAL LIGATION  2016   WISDOM TOOTH EXTRACTION      There were no vitals filed for this visit.   Subjective Assessment - 08/16/21 1322     Subjective "I have to tip my head to swallow."    Special Tests MBSS    Currently in Pain? No/denies                 General - 08/16/21 1330       General Information   Date of Onset 07/26/21    HPI Brooke Eaton is a 46 year old female with history of GERD, Schatzki's ring in 2018 s/p dilation, suspected IBS/D previously, but symptoms resolved and now with normal bowel habits, presenting today for MBSS after referral by Aliene Altes, PA-C. Pt reports that she recently noticed that she has to turn her head a certain way when she swallows (with and without food/liquid) and worries that is has to do with her thyroid, as she has a family history of thyroid changes. Pt reports a history of reflux, but feels her symptoms are well controlled with Nexium 40 mg once per day.  Type of Study MBS-Modified Barium Swallow Study    Previous Swallow Assessment N/A    Diet Prior to this Study Regular;Thin liquids    Temperature Spikes Noted No    Respiratory Status Room air    History of Recent Intubation No    Behavior/Cognition Alert;Cooperative    Oral Cavity Assessment Within Functional Limits    Oral Care Completed by SLP No    Oral Cavity - Dentition Other (Comment)   has upper partial   Vision Functional for self feeding    Self-Feeding Abilities Able to feed self    Patient Positioning Upright in chair    Baseline Vocal Quality Normal    Volitional Cough Strong    Volitional Swallow Able to elicit    Anatomy Within functional limits    Pharyngeal Secretions Not observed secondary MBS                Oral  Preparation/Oral Phase - 08/16/21 1348       Oral Preparation/Oral Phase   Oral Phase Impaired      Oral - Thin   Oral - Thin Teaspoon Within functional limits    Oral - Thin Cup Within functional limits    Oral - Thin Straw Within functional limits      Oral - Solids   Oral - Puree Within functional limits    Oral - Mechanical Soft Piecemeal swallowing    Oral - Regular Piecemeal swallowing    Oral - Pill Within functional limits      Electrical stimulation - Oral Phase   Was Electrical Stimulation Used No              Pharyngeal Phase - 08/16/21 1349       Pharyngeal Phase   Pharyngeal Phase Within functional limits              Cricopharyngeal Phase - 08/16/21 1350       Cervical Esophageal Phase   Cervical Esophageal Phase Within functional limits      Cervical Esophageal Phase - Comment   Other Esophageal Phase Observations brief stasis of barium tablet in the distal esophagus, which cleared with liquid wash; Pt reported pharyngeal globus sensation when this occurred.              Plan - 08/16/21 1400     Clinical Impression Statement Pt presents with normal oropharyngeal swallow in setting of upper and lower partials (Pt reports that she lost several teeth due to severe reflux) resulting in piecemeal deglutition of solids. Pt does exhibit an "effortful" swallow on most swallows, however function and physiology appear WNL. Swallow trigger is timely, no penetration/aspiration, or residuals noted. Esophageal sweep revealed brief stasis of barium tablet in the distal esophagus, but cleared with a second sip of liquid. When this occurred, Pt reported globus sensation in her pharynx, however SLP suspects this was due to referred symptoms/discomfort. It is possible that Pt's report of having to move her head a certain way when she swallows is due to referred esophageal symptoms, due to presences of upper partial, or could be attributed to anatomical and/or  neurological changes that are beyond the scope of this assessment. SLP suggested that Pt pursue an ENT consult if symptoms persist. Recommend regular textures and thin liquids with reflux precautions (provided in written form). No further SLP services recommended at this time. Pt scheduled for BPE after this assessment.    Consulted and Agree with Plan of Care Patient  Patient will benefit from skilled therapeutic intervention in order to improve the following deficits and impairments:   Dysphagia, oropharyngeal phase     Recommendations/Treatment - 08/16/21 1351       Swallow Evaluation Recommendations   Recommended Consults Consider ENT evaluation   If Pt continues to report globus sensation in the pharynx and has other concerns   SLP Diet Recommendations Thin;Age appropriate regular    Liquid Administration via Cup;Straw    Medication Administration Whole meds with liquid    Supervision Patient able to self feed    Postural Changes Seated upright at 90 degrees;Remain upright for at least 30 minutes after feeds/meals              Prognosis - 08/16/21 1359       Prognosis   Prognosis for Safe Diet Advancement Good      Individuals Consulted   Consulted and Agree with Results and Recommendations Patient    Report Sent to  Referring physician             Problem List Patient Active Problem List   Diagnosis Date Noted   Oropharyngeal dysphagia 59/74/1638   Folliculitis of perineum 02/16/2020   Pelvic fluid collection 10/19/2019   Constipation 10/12/2019   Nausea with vomiting 11/18/2018   Epigastric pain 01/13/2018   Gastritis and gastroduodenitis 03/18/2017   Abdominal pain, epigastric 10/22/2016   Esophageal dysphagia 10/22/2016   Asthma 09/02/2016   Marijuana use, continuous 09/02/2016   Menorrhagia with regular cycle 08/16/2016   PMS (premenstrual syndrome) 01/17/2012   Anxiety and depression    Post-traumatic stress disorder    Irritable  bowel syndrome    GERD (gastroesophageal reflux disease)    Thank you,  Genene Churn, [place]  Genene Churn, [place] 08/16/2021, 3:12 PM  [place], [place], [address] Phone: [phone]   Fax:  [phone]  Name: Brooke Eaton MRN: [id number] Date of Birth: August 26, 1975

## 2021-08-17 ENCOUNTER — Ambulatory Visit (HOSPITAL_COMMUNITY): Payer: Medicare Other

## 2021-09-06 ENCOUNTER — Other Ambulatory Visit: Payer: Self-pay | Admitting: Internal Medicine

## 2021-09-07 LAB — COMPLETE METABOLIC PANEL WITH GFR
AG Ratio: 1.3 (calc) (ref 1.0–2.5)
ALT: 8 U/L (ref 6–29)
AST: 12 U/L (ref 10–30)
Albumin: 3.8 g/dL (ref 3.6–5.1)
Alkaline phosphatase (APISO): 70 U/L (ref 31–125)
BUN: 11 mg/dL (ref 7–25)
CO2: 25 mmol/L (ref 20–32)
Calcium: 8.9 mg/dL (ref 8.6–10.2)
Chloride: 106 mmol/L (ref 98–110)
Creat: 0.76 mg/dL (ref 0.50–0.99)
Globulin: 3 g/dL (calc) (ref 1.9–3.7)
Glucose, Bld: 78 mg/dL (ref 65–99)
Potassium: 4.3 mmol/L (ref 3.5–5.3)
Sodium: 140 mmol/L (ref 135–146)
Total Bilirubin: 0.2 mg/dL (ref 0.2–1.2)
Total Protein: 6.8 g/dL (ref 6.1–8.1)
eGFR: 99 mL/min/{1.73_m2} (ref >=60)

## 2021-09-07 LAB — CBC
HCT: 37.7 % (ref 35.0–45.0)
Hemoglobin: 12.2 g/dL (ref 11.7–15.5)
MCH: 28.8 pg (ref 27.0–33.0)
MCHC: 32.4 g/dL (ref 32.0–36.0)
MCV: 89.1 fL (ref 80.0–100.0)
MPV: 9.6 fL (ref 7.5–12.5)
Platelets: 375 10*3/uL (ref 140–400)
RBC: 4.23 10*6/uL (ref 3.80–5.10)
RDW: 14.1 % (ref 11.0–15.0)
WBC: 5.7 10*3/uL (ref 3.8–10.8)

## 2021-09-07 LAB — LIPID PANEL
Cholesterol: 186 mg/dL (ref <200)
HDL: 39 mg/dL — ABNORMAL LOW (ref >=50)
LDL Cholesterol (Calc): 127 mg/dL (calc) — ABNORMAL HIGH
Non-HDL Cholesterol (Calc): 147 mg/dL (calc) — ABNORMAL HIGH (ref <130)
Total CHOL/HDL Ratio: 4.8 (calc) (ref <5.0)
Triglycerides: 97 mg/dL (ref <150)

## 2021-09-07 LAB — TSH: TSH: 1.37 mIU/L

## 2021-09-07 LAB — VITAMIN D 25 HYDROXY (VIT D DEFICIENCY, FRACTURES): Vit D, 25-Hydroxy: 12 ng/mL — ABNORMAL LOW (ref 30–100)

## 2021-10-10 ENCOUNTER — Other Ambulatory Visit: Payer: Medicare Other

## 2021-10-10 ENCOUNTER — Encounter: Payer: Self-pay | Admitting: Obstetrics & Gynecology

## 2021-10-10 ENCOUNTER — Other Ambulatory Visit: Payer: Self-pay

## 2021-10-10 ENCOUNTER — Other Ambulatory Visit (HOSPITAL_COMMUNITY)
Admission: RE | Admit: 2021-10-10 | Discharge: 2021-10-10 | Disposition: A | Discharge status: Home / Self Care | Payer: Medicare Other | Source: Ambulatory Visit | Attending: Obstetrics & Gynecology | Admitting: Obstetrics & Gynecology

## 2021-10-10 ENCOUNTER — Ambulatory Visit (INDEPENDENT_AMBULATORY_CARE_PROVIDER_SITE_OTHER): Payer: Medicare Other | Admitting: Obstetrics & Gynecology

## 2021-10-10 VITALS — BP 110/68 | HR 83 | Ht 66.0 in | Wt 234.2 lb

## 2021-10-10 DIAGNOSIS — Z113 Encounter for screening for infections with a predominantly sexual mode of transmission: Secondary | ICD-10-CM

## 2021-10-10 DIAGNOSIS — R319 Hematuria, unspecified: Secondary | ICD-10-CM | POA: Diagnosis not present

## 2021-10-10 DIAGNOSIS — N898 Other specified noninflammatory disorders of vagina: Secondary | ICD-10-CM

## 2021-10-10 DIAGNOSIS — Z114 Encounter for screening for human immunodeficiency virus [HIV]: Secondary | ICD-10-CM | POA: Diagnosis not present

## 2021-10-10 LAB — POCT URINALYSIS DIPSTICK
Glucose, UA: NEGATIVE
Ketones, UA: NEGATIVE
Leukocytes, UA: NEGATIVE
Nitrite, UA: NEGATIVE
Protein, UA: NEGATIVE

## 2021-10-10 NOTE — Progress Notes (Signed)
? ?  GYN VISIT ?Patient name: Brooke Eaton MRN [id number]  Date of birth: June 09, 1976 ?Chief Complaint:   ?feels bloated in waist (Pain in stomach around waist; vaginal odor) ? ?History of Present Illness:   ?Brooke Eaton is a 45 y.o. G28P1021  female being seen today for the following concerns: ? ?Vaginal/Urinary irritation:  Notes pressure in lower part of back area for the past few days.  Denies vaginal itching discharge.  +Vaginal odor that started yesterday- just different.  Denies dysuria.  Some lower pelvic pain.  Desires STI screening. ? ?S/p ablation and tubal ligation-no period ?No LMP recorded. Patient has had an ablation. ? ?Depression screen [facility] 2/9 10/18/2020 01/13/2018 12/18/2017 11/14/2017 10/23/2017  ?Decreased Interest 0 0 0 2 1  ?Down, Depressed, Hopeless 2 0 0 1 1  ?PHQ - 2 Score 2 0 0 3 2  ?Altered sleeping 1 - - 3 3  ?Tired, decreased energy 2 - - 3 1  ?Change in appetite 1 - - 1 0  ?Feeling bad or failure about yourself  3 - - 3 2  ?Trouble concentrating 0 - - 1 2  ?Moving slowly or fidgety/restless 2 - - 1 0  ?Suicidal thoughts 0 - - 1 1  ?PHQ-9 Score 11 - - 16 11  ?Difficult doing work/chores - - - Extremely dIfficult Somewhat difficult  ? ? ? ?Review of Systems:   ?Pertinent items are noted in HPI ?Denies fever/chills, dizziness, headaches, visual disturbances, fatigue, shortness of breath, chest pain, abdominal pain, vomiting, bowel movements, urination, or intercourse unless otherwise stated above.  ?Pertinent History Reviewed:  ?Reviewed past medical,surgical, social, obstetrical and family history.  ?Reviewed problem list, medications and allergies. ?Physical Assessment:  ? ?Vitals:  ? 10/10/21 1102  ?BP: 110/68  ?Pulse: 83  ?Weight: 106.2 kg  ?Height: '5\' 6"'$  (1.676 m)  ?Body mass index is 37.8 kg/m?. ? ?     Physical Examination:  ? General appearance: alert, well appearing, and in no distress ? Psych: mood appropriate, normal affect ? Skin: warm & dry  ? Cardiovascular: normal heart rate  noted ? Respiratory: normal respiratory effort, no distress ? Abdomen: soft, non-tender  ? Pelvic: normal external genitalia, vulva, vagina, cervix, uterus and adnexa ? Extremities: no edema  ? ?Chaperone: Levy Pupa   ? ?Assessment & Plan:  ?1) Vaginal/urinary irritation ?-plan to r/o infection both urine and vaginitis panel ordered ?-STI screening ordered ?-further management pending results ? ? ?Orders Placed This Encounter  ?Procedures  ? Urine Culture  ? POCT Urinalysis Dipstick  ? ? ?No follow-ups on file. ? ? ?Janyth Pupa, DO ?Attending [place] ?[facility] ? ? ? ?

## 2021-10-11 LAB — CERVICOVAGINAL ANCILLARY ONLY
Bacterial Vaginitis (gardnerella): NEGATIVE
Candida Glabrata: NEGATIVE
Candida Vaginitis: NEGATIVE
Chlamydia: NEGATIVE
Comment: NEGATIVE
Comment: NEGATIVE
Comment: NEGATIVE
Comment: NEGATIVE
Comment: NEGATIVE
Comment: NORMAL
Neisseria Gonorrhea: NEGATIVE
Trichomonas: NEGATIVE

## 2021-10-11 LAB — RPR: RPR Ser Ql: NONREACTIVE

## 2021-10-11 LAB — HIV ANTIBODY (ROUTINE TESTING W REFLEX): HIV Screen 4th Generation wRfx: NONREACTIVE

## 2021-10-12 LAB — URINE CULTURE

## 2022-01-01 ENCOUNTER — Encounter (HOSPITAL_COMMUNITY): Payer: Self-pay

## 2022-01-01 ENCOUNTER — Other Ambulatory Visit: Payer: Self-pay

## 2022-01-01 ENCOUNTER — Emergency Department (HOSPITAL_COMMUNITY)
Admission: EM | Admit: 2022-01-01 | Discharge: 2022-01-01 | Disposition: A | Discharge status: Home / Self Care | Payer: Medicare Other | Attending: Emergency Medicine | Admitting: Emergency Medicine

## 2022-01-01 DIAGNOSIS — Z91040 Latex allergy status: Secondary | ICD-10-CM | POA: Diagnosis not present

## 2022-01-01 DIAGNOSIS — M5441 Lumbago with sciatica, right side: Secondary | ICD-10-CM | POA: Insufficient documentation

## 2022-01-01 MED ORDER — METHYLPREDNISOLONE SODIUM SUCC 125 MG IJ SOLR
125.0000 mg | Freq: Once | INTRAMUSCULAR | Status: AC
  Administered 2022-01-01: 125 mg via INTRAMUSCULAR
  Filled 2022-01-01: qty 2

## 2022-01-01 MED ORDER — KETOROLAC TROMETHAMINE 60 MG/2ML IM SOLN
30.0000 mg | Freq: Once | INTRAMUSCULAR | Status: AC
  Administered 2022-01-01: 30 mg via INTRAMUSCULAR
  Filled 2022-01-01: qty 2

## 2022-01-01 NOTE — Discharge Instructions (Addendum)
COntinue current medications.  Follow up with pain management as scheduled

## 2022-01-01 NOTE — ED Triage Notes (Signed)
Pt presents to ED with complaints of right sided back pain x 1 week. Pt denies urinary symptoms.

## 2022-01-01 NOTE — ED Provider Notes (Signed)
[facility] Provider Note   CSN: [id number] Arrival date & time: 01/01/22  0932     History  Chief Complaint  Patient presents with   Back Pain    Brooke Eaton is a 46 y.o. female.  Patient complains of an exacerbation of pain in her right low back and her right sciatic area.  Patient reports no relief with home medications.  Patient is followed by pain management for this pain. she has an appointment in July for evaluatio.  Pt denies any new injuries   The history is provided by the patient. No language interpreter was used.  Back Pain Location:  Lumbar spine and sacro-iliac joint Quality:  Aching Radiates to:  Does not radiate Pain severity:  Moderate Onset quality:  Gradual Duration:  1 week Timing:  Constant Progression:  Worsening Relieved by:  Nothing Worsened by:  Nothing Ineffective treatments:  None tried Associated symptoms: no numbness and no weakness       Home Medications Prior to Admission medications   Medication Sig Start Date End Date Taking? Authorizing Provider  albuterol (ACCUNEB) 0.63 MG/3ML nebulizer solution Take 1 ampule by nebulization as needed for wheezing.    [provider]  ALBUTEROL IN Inhale into the lungs as needed.    [provider]  esomeprazole (NEXIUM) 40 MG capsule Take 1 capsule (40 mg total) by mouth daily before breakfast. 07/26/21   Erenest Rasher, PA-C  pregabalin (LYRICA) 75 MG capsule Take 150 mg by mouth 3 (three) times daily.     [provider]  tiZANidine (ZANAFLEX) 4 MG tablet as needed.  03/10/19   [provider]  traZODone (DESYREL) 50 MG tablet Take 50 mg by mouth at bedtime as needed.    [provider]  triamcinolone cream (KENALOG) 0.5 % As needed 01/14/19   [provider]  venlafaxine XR (EFFEXOR-XR) 150 MG 24 hr capsule Take 150 mg by mouth daily with breakfast. As needed    [provider]      Allergies    Doxycycline,  Flagyl [metronidazole], Latex, and Penicillins    Review of Systems   Review of Systems  Musculoskeletal:  Positive for back pain.  Neurological:  Negative for weakness and numbness.  All other systems reviewed and are negative.  Physical Exam Updated Vital Signs BP 108/82 (BP Location: Right Arm)   Pulse 66   Temp 98.4 F (36.9 C) (Oral)   Resp 16   Ht '5\' 6"'$  (1.676 m)   Wt 104.3 kg   SpO2 100%   BMI 37.12 kg/m  Physical Exam Vitals reviewed.  Constitutional:      Appearance: Normal appearance.  Cardiovascular:     Rate and Rhythm: Normal rate.  Pulmonary:     Effort: Pulmonary effort is normal.  Musculoskeletal:        General: Normal range of motion.     Cervical back: Normal range of motion.     Comments: Tender right sciatic area right low back.  Pain with movement  nv and ns intact   Skin:    General: Skin is warm.  Neurological:     General: No focal deficit present.     Mental Status: She is alert.  Psychiatric:        Mood and Affect: Mood normal.    ED Results / Procedures / Treatments   Labs (all labs ordered are listed, but only abnormal results are displayed) Labs Reviewed - No data to  display  EKG None  Radiology No results found.  Procedures Procedures    Medications Ordered in ED Medications  ketorolac (TORADOL) injection 30 mg (30 mg Intramuscular Given 01/01/22 1136)  methylPREDNISolone sodium succinate (SOLU-MEDROL) 125 mg/2 mL injection 125 mg (125 mg Intramuscular Given 01/01/22 1136)    ED Course/ Medical Decision Making/ A&P                           Medical Decision Making Pt has choronic back pain.  P  Risk Risk Details: Pt given toradol and solumedrol IM.  Pt advised to follow up with pain management as scheduled            Final Clinical Impression(s) / ED Diagnoses Final diagnoses:  Acute low back pain with right-sided sciatica, unspecified back pain laterality    Rx / DC Orders ED Discharge Orders      None      An After Visit Summary was printed and given to the patient.    Fransico Meadow, [place] 01/01/22 1452    Noemi Chapel, MD 01/08/22 [phone]

## 2022-01-08 ENCOUNTER — Ambulatory Visit (INDEPENDENT_AMBULATORY_CARE_PROVIDER_SITE_OTHER): Payer: Medicare Other

## 2022-01-08 ENCOUNTER — Ambulatory Visit (INDEPENDENT_AMBULATORY_CARE_PROVIDER_SITE_OTHER): Payer: Medicare Other | Admitting: Podiatry

## 2022-01-08 DIAGNOSIS — M722 Plantar fascial fibromatosis: Secondary | ICD-10-CM

## 2022-01-08 DIAGNOSIS — M25373 Other instability, unspecified ankle: Secondary | ICD-10-CM

## 2022-01-08 DIAGNOSIS — L603 Nail dystrophy: Secondary | ICD-10-CM | POA: Diagnosis not present

## 2022-01-08 NOTE — Patient Instructions (Signed)
Look for urea 40% gel and apply to the thickened dry skin / calluses. This can be bought online such as [employer].     Chronic Ankle Instability & Rehab Chronic Ankle Instability Chronic ankle instability is a condition that makes the ankle weak and more likely to give way. The condition is common among athletes, especially those with prior ankle ligament injury. Ligaments are strong tissues that connectbones to each other. What are the causes?  This condition is caused by multiple ankle sprains that have not healedproperly, leaving the ankle ligaments loose or damaged. What increases the risk? This condition is more likely to develop in people who participate in sports in which there is a risk of spraining an ankle. These sports include: Cross-country trail running. Basketball. Baseball. Tennis. Football. Soccer. What are the signs or symptoms? Symptoms of this condition include: Rolling your ankle repeatedly. Swelling. Pain. Bruising. Tenderness. Feeling wobbly or unsteady on your foot. Difficulty walking on uneven surfaces or in the dark. How is this diagnosed? This condition may be diagnosed based on: Your symptoms. Your medical history. A physical exam. Your health care provider will check your balance, strength, and range of motion. He or she will also check your injured ankle against your healthy ankle. Imaging tests, such as: An X-ray. A CT scan. An MRI. An ultrasound. How is this treated? Treatment for this condition may include: Wearing a removable boot, brace, or splint. Wearing supportive shoes or shoe inserts. Applying ice to the ankle to reduce swelling. Taking anti-inflammatory pain medicine. Doing exercises (physical therapy). Not putting any body weight, or putting only limited body weight, on your ankle for several days. Gradually returning to full activity. Surgery to repair damaged ligaments. Usually, surgery is needed only if the condition is severe  or if othertreatments do not work. Follow these instructions at home: If you have a boot, brace, or splint: Wear it as told by your health care provider. Remove it only as told by your health care provider. Loosen it if your toes tingle, become numb, or turn cold and blue. Keep it clean. If it is not waterproof: Do not let it get wet. Cover it with a watertight covering when you take a bath or a shower. Ask your health care provider when it is safe to drive with a boot, brace, or splint on your foot. Managing pain, stiffness, and swelling  If directed, put ice on the injured area. If you have a removable boot, brace, or splint, remove it as told by your health care provider. Put ice in a plastic bag. Place a towel between your skin and the bag. Leave the ice on for 20 minutes, 2-3 times a day. Move your toes, foot, and ankle often to reduce stiffness and swelling. Raise (elevate) the injured area above the level of your heart while you are sitting or lying down.  Activity Return to your normal activities as told by your health care provider. Ask your health care provider what activities are safe for you. Do not put your full body weight on your ankle until your health care provider says that you can. Do not do any activities that make pain or swelling worse. Do exercises as told by your health care provider. General instructions Take over-the-counter and prescription medicines only as told by your health care provider. Wear supportive shoes or inserts as told by your health care provider. Keep all follow-up visits as told by your health care provider. This is important. How is this  prevented? Wear supportive footwear that is appropriate for your athletic activity. Avoid athletic activities that cause pain or swelling in your ankle. See your health care provider if you have an ankle sprain that causes pain and swelling for more than 2-4 weeks. Do ankle range-of-motion and  strengthening exercises as told by your health care provider before beginning any athletic activity. If you start a new athletic activity, start gradually to build up your strength and flexibility. Contact a health care provider if: Your condition is not getting better after 2-4 weeks of treatment. You cannot put body weight on your ankle without feeling more pain. Summary Chronic ankle instability is a condition that makes the ankle weak and more likely to give way. This condition is caused by multiple ankle sprains that have not healed properly, leaving the ankle ligaments loose or damaged. Treatment includes wearing a boot, brace, or splint, taking medicines for pain and inflammation, and using ice on the affected area. Follow your health care provider's instructions for caring for your ankle during recovery. Contact your health care provider if your ankle does not get better in 2-4 weeks, or if you cannot put weight on your ankle without feeling more pain. This information is not intended to replace advice given to you by your health care provider. Make sure you discuss any questions you have with your healthcare provider. Document Revised: 05/05/2018 Document Reviewed: 05/05/2018 Elsevier Patient Education  2022 [place] your health care provider which exercises are safe for you. Do exercises exactly as told by your health care provider and adjust them as directed. It is normal to feel mild stretching, pulling, tightness, or discomfort as you do these exercises. Stop right away if you feel sudden pain or your pain gets worse. Do not begin these exercises until told by your health care provider. Strengthening exercises These exercises build strength and endurance in your ankle. Endurance is theability to use your muscles for a long time, even after they get tired. Ankle eversion Sit on the floor with your legs straight out in front of you. Loop a rubber exercise band around the  ball of your left / right foot. The ball of your foot is on the walking surface, right under your toes. Hold the ends of the band in your hands, or secure the band to a stable object. Slowly push your foot outward, away from your other leg (eversion). Hold this position for 10 seconds. Slowly return your foot to the starting position. Repeat 10 times. Complete this exercise 2 times a day. Heel walking  This exercise strengthens the muscles that push the ankle backward to point your toes toward your knee (ankle dorsiflexors). Walk on your heels for 10 ft. Keep your toes as high as possible. Repeat 10 times. Complete this exercise 2  times per day. Toe walking  This exercise strengthens the muscles that push the ankle forward and your toes downward (ankle plantar flexors). Walk on your toes for 10 ft. Keep your heels as high as possible. Repeat 10  times. Complete this exercise 2  times per day. Balance exercises These exercises improve or maintain your balance. Balance is important inimproving ankle stability and preventing falls. Tandem walking Do this exercise in a hallway or room that is at least 10 ft (3 m) long. Stand with one foot directly in front of the other (tandem). You can use the walls to help you balance if needed, but try not to use them for  support. Slowly lift your back foot and place it directly in front of your other foot. Continue to walk in this heel-to-toe way for 10 ft. Repeat 10  times. Complete this exercise 2  times a day. Single leg stand Without shoes, stand near a railing or in a doorway. You may hold on to the railing or door frame as needed for balance. Stand on your left / right foot. Keep your big toe down on the floor and try to keep your arch lifted. If this is too easy, you can try doing it while you do one of these actions: Keep your eyes closed. Stand on a pillow. Throw a ball against a wall and catch it when it returns. Hold this position for 10  seconds. Repeat 10 times. Complete this exercise 2 times a day. Ankle inversion and ankle eversion This exercise is also called foot rotation with a balance board. It uses a balance board to rotate the foot and ankle inward (inversion) and outward (eversion). Ask your health care provider where you can get a balance board or how you can make one. Stand on a non-carpeted surface near a countertop or wall. Step onto the balance board so your feet are hip width apart. Keep your feet in place, and keep your upper body and hips steady. Using only your feet and ankles to move the board, do the following exercises: Tip the board from side to side as far as you can, alternating between tipping to the left and tipping to the right. Tip the board so it silently taps the floor. Do not let the board forcefully hit the floor. From time to time, pause to hold a steady midway position, with neither the right nor the left sides touching the ground. Tip the board side to side so the board does not hit the floor at all. From time to time, pause to hold a steady midway position. Repeat 10 times, pausing from time to time to hold a steady position.Complete this exercise 2 times a day. Ankle plantar flexion and ankle dorsiflexion This exercise is also called foot flexion with a balance board. It uses a balance board to push the foot downward and away from the leg (plantar flexion) or upward and toward the leg (dorsiflexion). Ask your health care provider where you can get a balance board or how you can make one. Stand on a non-carpeted surface near a countertop or wall. Step onto the balance board so your feet are hip width apart. Keep your feet in place, and keep your upper body and hips steady. Using only your feet and ankles, do the following exercises: Tip the board forward and backward so the board silently taps the floor. Do not let the board forcefully hit the floor. From time to time, pause to hold a steady  position midway between touching the floor in front and touching the floor in back. Tip the board forward and backward so the board does not hit the floor at all. From time to time, pause to hold a steady position. Repeat 10 times, pausing from time to time to hold a steady position.Complete this exercise 2 times a day. This information is not intended to replace advice given to you by your health care provider. Make sure you discuss any questions you have with your healthcare provider. Document Revised: 11/12/2018 Document Reviewed: 05/05/2018 Elsevier Patient Education  2022 [employer].

## 2022-01-14 NOTE — Progress Notes (Signed)
  Subjective:  Patient ID: Brooke Eaton, female    DOB: 05/06/1976,  MRN: [id number]  Chief Complaint  Patient presents with   Foot Pain    Bilateral heel pain, treated for plantar fasciitis in the past    46 y.o. female presents with the above complaint. History confirmed with patient.  She has had ingrown's taken out before.  The right symptoms still hurts.  Its fairly thick.  The nailbed is sensitive.  She also has pain towards the outside of the ankle feels like the roll and and she has pain in the foot.  Objective:  Physical Exam: warm, good capillary refill, no trophic changes or ulcerative lesions, normal DP and PT pulses, normal sensory exam, and hypermobility and calcaneal inversion, she has previous ingrown nail removal with sensitivity the nailbed..   Radiographs: Multiple views x-ray of both feet: no fracture, dislocation, swelling or degenerative changes noted Assessment:   1. Plantar fasciitis, bilateral      Plan:  Patient was evaluated and treated and all questions answered.  Discussed etiology and treatment options of ankle instability.  She would benefit from physical therapy which I recommended.  Physical therapy prescription will be sent to [facility] in [place].  For nails am not sure that there is much I can offer that would improve her current condition.  We did discuss using urea gel which may help with some of the thickness and hardness of the nails.  She will get this over-the-counter online.  Return in about 8 weeks (around 03/05/2022) for recheck ankle stability.

## 2022-03-05 ENCOUNTER — Ambulatory Visit: Payer: Medicare Other | Admitting: Podiatry

## 2022-04-24 ENCOUNTER — Other Ambulatory Visit: Payer: Self-pay | Admitting: Internal Medicine

## 2022-04-24 DIAGNOSIS — Z1231 Encounter for screening mammogram for malignant neoplasm of breast: Secondary | ICD-10-CM

## 2022-05-01 ENCOUNTER — Other Ambulatory Visit (HOSPITAL_COMMUNITY)
Admission: RE | Admit: 2022-05-01 | Discharge: 2022-05-01 | Disposition: A | Discharge status: Home / Self Care | Payer: Medicare Other | Source: Ambulatory Visit | Attending: Adult Health | Admitting: Adult Health

## 2022-05-01 ENCOUNTER — Ambulatory Visit (INDEPENDENT_AMBULATORY_CARE_PROVIDER_SITE_OTHER): Payer: Medicare Other | Admitting: Adult Health

## 2022-05-01 ENCOUNTER — Encounter: Payer: Self-pay | Admitting: Adult Health

## 2022-05-01 VITALS — BP 115/84 | HR 69 | Ht 66.0 in | Wt 239.5 lb

## 2022-05-01 DIAGNOSIS — L900 Lichen sclerosus et atrophicus: Secondary | ICD-10-CM

## 2022-05-01 DIAGNOSIS — N898 Other specified noninflammatory disorders of vagina: Secondary | ICD-10-CM

## 2022-05-01 MED ORDER — CLOBETASOL PROPIONATE 0.05 % EX CREA: TOPICAL_CREAM | CUTANEOUS | 0 refills | Status: DC

## 2022-05-01 NOTE — Progress Notes (Signed)
  Subjective:     Patient ID: Brooke Eaton, female   DOB: Jun 10, 1976, 46 y.o.   MRN: [id number]  HPI Braiden is a 46 year old black female, single, [id number], in complaining of vaginal irritation x 1 week, burns when pee hits tissues.     Component Value Date/Time   DIAGPAP  10/18/2020 1018    - Negative for intraepithelial lesion or malignancy (NILM)   DIAGPAP  01/13/2018 0000    NEGATIVE FOR INTRAEPITHELIAL LESIONS OR MALIGNANCY.   [place] Negative 10/18/2020 1018   ADEQPAP  10/18/2020 1018    Satisfactory for evaluation; transformation zone component ABSENT.   ADEQPAP  01/13/2018 0000    Satisfactory for evaluation  endocervical/transformation zone component PRESENT.   PCP is Dr Jeanie Cooks  Review of Systems +vaginal irritation x 1 week, burns when pee hits tissues Denies any pain with sex Reviewed past medical,surgical, social and family history. Reviewed medications and allergies.     Objective:   Physical Exam BP 115/84 (BP Location: Left Arm, Patient Position: Sitting, Cuff Size: Large)   Pulse 69   Ht '5\' 6"'$  (1.676 m)   Wt 239 lb 8 oz (108.6 kg)   BMI 38.66 kg/m     Skin warm and dry.Pelvic: external genitalia is normal in appearance no lesions, vagina: pink and moist, has thick skin changes at introitus, she said it is tender here and she tore with delivery,urethra has no lesions or masses noted, cervix:smooth and bulbous, uterus: normal size, shape and contour, non tender, no masses felt, adnexa: no masses or tenderness noted. Bladder is non tender and no masses felt. CV swab obtained. Fall risk is low  Upstream - 05/01/22 1039       Pregnancy Intention Screening   Does the patient want to become pregnant in the next year? No    Does the patient's partner want to become pregnant in the next year? No    Would the patient like to discuss contraceptive options today? No      Contraception Wrap Up   Current Method Female Sterilization    End Method Female Sterilization             Examination chaperoned by Levy Pupa LPN Assessment:     1. Vaginal irritation CV swab sent for GC/CHL,trich,BV and yeast  2. Lichen sclerosus Has white area at introitus Will rx temovate Meds ordered this encounter  Medications   clobetasol cream (TEMOVATE) 0.05 %    Sig: Use bid to affected area for 2 weeks then 2 x weekly    Dispense:  30 g    Refill:  0    Order Specific Question:   Supervising Provider    Answer:   Florian Buff [[id number]]       Plan:     Will recheck in 4 weeks to see if better, if not may get punch biopsy

## 2022-05-02 LAB — CERVICOVAGINAL ANCILLARY ONLY
Bacterial Vaginitis (gardnerella): NEGATIVE
Candida Glabrata: NEGATIVE
Candida Vaginitis: NEGATIVE
Chlamydia: NEGATIVE
Comment: NEGATIVE
Comment: NEGATIVE
Comment: NEGATIVE
Comment: NEGATIVE
Comment: NEGATIVE
Comment: NORMAL
Neisseria Gonorrhea: NEGATIVE
Trichomonas: NEGATIVE

## 2022-05-28 ENCOUNTER — Ambulatory Visit
Admission: RE | Admit: 2022-05-28 | Discharge: 2022-05-28 | Disposition: A | Discharge status: Home / Self Care | Payer: Medicare Other | Source: Ambulatory Visit | Attending: Internal Medicine | Admitting: Internal Medicine

## 2022-05-28 DIAGNOSIS — Z1231 Encounter for screening mammogram for malignant neoplasm of breast: Secondary | ICD-10-CM

## 2022-05-29 ENCOUNTER — Encounter: Payer: Self-pay | Admitting: Adult Health

## 2022-05-29 ENCOUNTER — Ambulatory Visit (INDEPENDENT_AMBULATORY_CARE_PROVIDER_SITE_OTHER): Payer: Medicare Other | Admitting: Adult Health

## 2022-05-29 VITALS — BP 119/87 | HR 84 | Ht 66.0 in | Wt 234.5 lb

## 2022-05-29 DIAGNOSIS — N898 Other specified noninflammatory disorders of vagina: Secondary | ICD-10-CM

## 2022-05-29 DIAGNOSIS — L0292 Furuncle, unspecified: Secondary | ICD-10-CM | POA: Diagnosis not present

## 2022-05-29 DIAGNOSIS — S60861A Insect bite (nonvenomous) of right wrist, initial encounter: Secondary | ICD-10-CM

## 2022-05-29 DIAGNOSIS — L900 Lichen sclerosus et atrophicus: Secondary | ICD-10-CM | POA: Diagnosis not present

## 2022-05-29 DIAGNOSIS — W57XXXA Bitten or stung by nonvenomous insect and other nonvenomous arthropods, initial encounter: Secondary | ICD-10-CM

## 2022-05-29 MED ORDER — SULFAMETHOXAZOLE-TRIMETHOPRIM 800-160 MG PO TABS: 1.0000 | ORAL_TABLET | Freq: Two times a day (BID) | ORAL | 0 refills | Status: DC

## 2022-05-29 MED ORDER — FLUCONAZOLE 150 MG PO TABS: ORAL_TABLET | ORAL | 1 refills | Status: DC

## 2022-05-29 NOTE — Progress Notes (Signed)
  Subjective:     Patient ID: Brooke Eaton, female   DOB: 07-25-1976, 46 y.o.   MRN: [id number]  HPI Brooke Eaton is a 46 year old black female, single, [id number] in back in follow up on using temovate and says she still ha irritation and burns when pees. Has boil left inner thigh that she popped, and woke up with ?bite right inner wrist, had blister that she popped.     Component Value Date/Time   DIAGPAP  10/18/2020 1018    - Negative for intraepithelial lesion or malignancy (NILM)   DIAGPAP  01/13/2018 0000    NEGATIVE FOR INTRAEPITHELIAL LESIONS OR MALIGNANCY.   [place] Negative 10/18/2020 1018   ADEQPAP  10/18/2020 1018    Satisfactory for evaluation; transformation zone component ABSENT.   ADEQPAP  01/13/2018 0000    Satisfactory for evaluation  endocervical/transformation zone component PRESENT.   PCP is Dr Jeanie Cooks  Review of Systems Vaginal irritation persists Boil popped  ?insect bite inner wrist  Reviewed past medical,surgical, social and family history. Reviewed medications and allergies.     Objective:   Physical Exam BP 119/87 (BP Location: Left Arm, Patient Position: Sitting, Cuff Size: Normal)   Pulse 84   Ht '5\' 6"'$  (1.676 m)   Wt 234 lb 8 oz (106.4 kg)   BMI 37.85 kg/m     Skin warm and dry.Pelvic: external genitalia is normal in appearance no lesions, vagina: pink and moist, has thick skin changes at introitus, she said it is tender here and she tore with delivery, has boil left inner thigh that has popped, but still tender and about 1 cm in diameter. Has ruptured vesicle right inner wrist.    Upstream - 05/29/22 1126       Pregnancy Intention Screening   Does the patient want to become pregnant in the next year? No    Does the patient's partner want to become pregnant in the next year? No    Would the patient like to discuss contraceptive options today? No      Contraception Wrap Up   Current Method Female Sterilization    End Method Female Sterilization     Contraception Counseling Provided No              Examination chaperoned by Levy Pupa LPN  Assessment:     1. Lichen sclerosus Continue temovate 2 x weekly, has refills  Review handout on LS  2. Boil She has popped Will rx septra ds and diflucan Use warm compresses Meds ordered this encounter  Medications   fluconazole (DIFLUCAN) 150 MG tablet    Sig: Take 1 now and 1 in 3 days if needed    Dispense:  2 tablet    Refill:  1    Order Specific Question:   Supervising Provider    Answer:   Tania Ade H [[id number]]   sulfamethoxazole-trimethoprim (BACTRIM DS) 800-160 MG tablet    Sig: Take 1 tablet by mouth 2 (two) times daily. Take 1 bid    Dispense:  28 tablet    Refill:  0    Order Specific Question:   Supervising Provider    Answer:   Elonda Husky, LUTHER H [[id number]]     3. Insect bite of right wrist, initial encounter The septra ds should help But watch for changes  4. Vaginal irritation    Plan:     Return 06/12/22 for punch biopsy with Dr Nelda Marseille at introitus area

## 2022-06-12 ENCOUNTER — Encounter: Payer: Self-pay | Admitting: Internal Medicine

## 2022-06-14 ENCOUNTER — Encounter: Payer: Self-pay | Admitting: Obstetrics & Gynecology

## 2022-06-14 ENCOUNTER — Ambulatory Visit (INDEPENDENT_AMBULATORY_CARE_PROVIDER_SITE_OTHER): Payer: Medicare Other | Admitting: Obstetrics & Gynecology

## 2022-06-14 ENCOUNTER — Other Ambulatory Visit (HOSPITAL_COMMUNITY)
Admission: RE | Admit: 2022-06-14 | Discharge: 2022-06-14 | Disposition: A | Discharge status: Home / Self Care | Payer: Medicare Other | Source: Ambulatory Visit | Attending: Obstetrics & Gynecology | Admitting: Obstetrics & Gynecology

## 2022-06-14 VITALS — BP 125/81 | HR 75 | Ht 66.0 in | Wt 239.0 lb

## 2022-06-14 DIAGNOSIS — L900 Lichen sclerosus et atrophicus: Secondary | ICD-10-CM | POA: Insufficient documentation

## 2022-06-14 DIAGNOSIS — N9089 Other specified noninflammatory disorders of vulva and perineum: Secondary | ICD-10-CM | POA: Diagnosis present

## 2022-06-14 NOTE — Progress Notes (Signed)
GYN VISIT Patient name: Brooke Eaton MRN [id number]  Date of birth: [birth year] Chief Complaint:   Procedure (Punch biopsy)  History of Present Illness:   Brooke Eaton is a 46 y.o. [phone] female being seen today for follow up regarding the following:     LS-  Pt has been seen by Electa Sniff and treated with LS.  While the ointment does work there is a specific are tat continues to bother her.  When Anderson Malta looked she felt a biopsy was needed.  In review, notes that a small area will get irritated s/p BM or intercourse.  When she uses the temovate does relieve it, but it burns.  Denies vaginal discharge or odor.  Denies pelvic pain.    No LMP recorded. Patient has had an ablation.     10/18/2020   10:19 AM 01/13/2018    2:12 PM 12/18/2017    2:09 PM 11/14/2017   10:20 AM 10/23/2017    1:54 PM  Depression screen PHQ 2/9  Decreased Interest 0 0 0 2 1  Down, Depressed, Hopeless 2 0 0 1 1  PHQ - 2 Score 2 0 0 3 2  Altered sleeping '1   3 3  '$ Tired, decreased energy '2   3 1  '$ Change in appetite 1   1 0  Feeling bad or failure about yourself  '3   3 2  '$ Trouble concentrating 0   1 2  Moving slowly or fidgety/restless 2   1 0  Suicidal thoughts 0   1 1  PHQ-9 Score '11   16 11  '$ Difficult doing work/chores    Extremely dIfficult Somewhat difficult     Review of Systems:   Pertinent items are noted in HPI Denies fever/chills, dizziness, headaches, visual disturbances, fatigue, shortness of breath, chest pain, abdominal pain, vomiting, no problems with periods, bowel movements, or urination. Pertinent History Reviewed:  Reviewed past medical,surgical, social, obstetrical and family history.  Reviewed problem list, medications and allergies. Physical Assessment:   Vitals:   06/14/22 1053  BP: 125/81  Pulse: 75  Weight: 239 lb (108.4 kg)  Height: '5\' 6"'$  (1.676 m)  Body mass index is 38.58 kg/m.       Physical Examination:   General appearance: alert, well appearing, and in no  distress  Psych: mood appropriate, normal affect  Skin: warm & dry   Cardiovascular: normal heart rate noted  Respiratory: normal respiratory effort, no distress  Abdomen: soft, non-tender   Pelvic: VULVA: normal external labia majora bilaterally, shortened perineum noted.  At introital opening, symmetrical hyperpigmentation/darkening noted.  On left side slightly raised tender 68m area noted.  No other abnormalities appreciated  Extremities: no edema, no calf tenderness bilaterally  Chaperone: ACelene Squibb   VULVAR BIOPSY NOTE The indications for vulvar biopsy (rule out neoplasia, establish lichen sclerosus diagnosis) were reviewed.   Risks of the biopsy including pain, bleeding, infection, inadequate specimen, scarring and need for additional procedures  were discussed. The patient stated understanding and agreed to undergo procedure today. Consent was signed,  time out performed.  The patient's vulva was prepped with Betadine. 1% lidocaine was injected into the area. A 4-mm punch biopsy was done, biopsy tissue was picked up with sterile forceps and sterile scissors were used to excise the lesion.  Small bleeding was noted and hemostasis was achieved using silver nitrate sticks.  The patient tolerated the procedure well.  Assessment & Plan:  1) LS, Vulvar irritation -biopsy obtained as  above -reviewed conservative management and plan to continue with Clobetasol -Post-procedure instructions  (pelvic rest for one week) were given to the patient. The patient is to call with heavy bleeding, fever greater than 100.4, foul smelling vaginal discharge or other concerns. The patient will be return to clinic in two weeks for discussion of results.   No orders of the defined types were placed in this encounter.   Return for TBD- as scheduled.   Brooke Pupa, DO Attending [place], [facility] for [employer], [place]

## 2022-06-24 LAB — SURGICAL PATHOLOGY

## 2022-07-07 ENCOUNTER — Other Ambulatory Visit: Payer: Self-pay | Admitting: Gastroenterology

## 2022-07-07 DIAGNOSIS — K219 Gastro-esophageal reflux disease without esophagitis: Secondary | ICD-10-CM

## 2022-07-25 ENCOUNTER — Encounter: Payer: Self-pay | Admitting: Gastroenterology

## 2022-07-25 ENCOUNTER — Ambulatory Visit (INDEPENDENT_AMBULATORY_CARE_PROVIDER_SITE_OTHER): Payer: Medicare Other | Admitting: Gastroenterology

## 2022-07-25 VITALS — BP 124/86 | HR 92 | Temp 97.7°F | Ht 66.0 in | Wt 237.2 lb

## 2022-07-25 DIAGNOSIS — R112 Nausea with vomiting, unspecified: Secondary | ICD-10-CM

## 2022-07-25 DIAGNOSIS — K219 Gastro-esophageal reflux disease without esophagitis: Secondary | ICD-10-CM | POA: Diagnosis not present

## 2022-07-25 DIAGNOSIS — R1312 Dysphagia, oropharyngeal phase: Secondary | ICD-10-CM | POA: Diagnosis not present

## 2022-07-25 MED ORDER — ESOMEPRAZOLE MAGNESIUM 40 MG PO CPDR: 40.0000 mg | DELAYED_RELEASE_CAPSULE | Freq: Two times a day (BID) | ORAL | 1 refills | Status: DC

## 2022-07-25 MED ORDER — ONDANSETRON 4 MG PO TBDP: 4.0000 mg | ORAL_TABLET | Freq: Two times a day (BID) | ORAL | 1 refills | Status: DC

## 2022-07-25 NOTE — Patient Instructions (Addendum)
For your reflux: Start taking Nexium 40 mg twice daily, 30 minutes prior to breakfast and dinner.  I have sent a 90-day supply for you with refills. For breakthrough symptoms or ongoing nausea you may take famotidine (Pepcid) 20 mg once daily as needed. (This is over-the-counter).  Follow a GERD diet:  Avoid fried, fatty, greasy, spicy, citrus foods. Avoid caffeine and carbonated beverages. Avoid chocolate. Try eating 4-6 small meals a day rather than 3 large meals. Do not eat within 3 hours of laying down. Prop head of bed up on wood or bricks to create a 6 inch incline.  For your nausea: -I have sent in Zofran 4 mg for you to use under the tongue up to twice daily as needed for nausea.  On days that you wake up with morning nausea I would take this prior to taking your morning medications to avoid throwing them up.  If you are not nauseous there is no need to take the medication. -While you are nauseous is important to avoid any fried or fatty foods.  This includes snack foods.  I would recommend more bland foods such as plain chicken for protein, fruits, vegetables, toast, mashed potatoes, etc.  If you begin to have worsening dysphagia or nausea not able to be controlled with medication and dietary changes, we will consider repeating upper endoscopy for further evaluation.  It was a pleasure to meet you today!  I we have a wonderful Christmas and a happy new year!  We will plan to follow-up in 3-4 months, or sooner if needed.  It was a pleasure to see you today. I want to create trusting relationships with patients. If you receive a survey regarding your visit,  I greatly appreciate you taking time to fill this out on paper or through your MyChart. I value your feedback.  Venetia Night, MSN, FNP-BC, AGACNP-BC [facility]

## 2022-07-25 NOTE — Progress Notes (Addendum)
GI Office Note    Referring Provider: Nolene Ebbs, MD Primary Care Physician:  Nolene Ebbs, MD Primary Gastroenterologist: Elon Alas. Abbey Chatters, DO  Date:  07/25/2022  ID:  Brooke Eaton, DOB April 14, 1976, MRN [id number]   Chief Complaint   Chief Complaint  Patient presents with   Follow-up   History of Present Illness  Brooke Eaton is a 46 y.o. female with a history of GERD, Schatzki's ring in 2008 s/p dilation, epigastric pain, IBS-D home anxiety and depression, fibromyalgia, substance abuse, presenting today for follow-up.  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 pylori.  CT A/P July 2020 with no findings to explain epigastric pain, nausea, vomiting.  Prior improvement of epigastric pain with drinking 2% or lactose-free milk.  Previous notes have reported that she has tried various PPIs for GERD management including Nexium, omeprazole, and Prevacid.  Nexium noted to be most helpful.  Omeprazole not helpful at all and has had diarrhea with Prevacid.  Last seen in the office 07/26/2021.  Reportedly GERD well-controlled with Nexium 40 mg daily.  Denied abdominal pain, nausea, vomiting.  Reports some trouble with the actual swallowing over the last year feeling she does move her head a certain way and force herself to swallow.  Occurs with eating, drinking, symptoms even with swallowing Rehman saliva.  Once able to swallow food and liquids go down okay.  Denies any odynophagia, unintentional weight loss, BRBPR, melena.  Bowels moving well.  Advised modified barium swallow study with BPE.  Continue Nexium 40 mg daily, follow GERD  diet.  Follow-up in 1 year or sooner pending swallow results.  MBSS 08/16/2021: Normal oropharyngeal swallow in setting of upper and lower partials resulting in piecemeal deglutition of solids.  Exhibits an effortful swallow, swallows however function and physiology appear within normal limits.  Esophageal sweep revealed brief stasis of barium tablet in the distal esophagus but cleared with second sip of liquid.  This is when patient reported globus sensation in her pharynx.  Suspected that referred esophageal symptoms or etiology of patient feeling she needs to move her head in a certain way to swallow pop only secondary to upper partial or treatment to anatomical or neurological changes that are beyond the scope of assessment.  ENT consult recommended if symptoms persist.  Regular textures and thin liquids with reflux precautions recommended.  BPE 08/16/2021:  -No evidence of esophageal mass, stricture, or filling defect.   -Mild diffuse impairment of esophageal motility with incomplete clearance of barium by primary peristaltic waves.   -No evidence of hiatal hernia.  No reflux witnessed during exam. -ENT consult and additional EGD for empiric dilation of possible esophageal web offered however patient would like to see if symptoms worsened prior to proceeding.   Today: Has occasional issues with nausea. Wakes up with nausea at times. Sometimes will throw up her medicine. Has a few episodes a week and will have some dry heaves. Unsure if it is anxiety. Reflux is controlled on nexium 40 mg twice daily. Would like 90 day supply. Swallowing is better than previously. Not having any issues.   Tries not to take medications if she doesn't have to and denies any new medications related to nausea and yellow bile. Denies odynophagia. No hematemesis.  Has not been constipated. Has a BM 2-3 times a day and is soft. No straining. No melena or BRBPR.   The past few months she has not had a great appetite.  Prednisone gives her the urge to eat but otherwise not much of an appetite. She has been working toward trying to lose weight.    Current Outpatient Medications  Medication Sig Dispense Refill   ondansetron (ZOFRAN-ODT) 4 MG disintegrating tablet Take 1 tablet (4 mg total) by mouth 2 (two) times daily. 30 tablet 1   pregabalin (LYRICA) 75 MG capsule Take 150 mg by mouth 3 (three) times daily.      venlafaxine XR (EFFEXOR-XR) 150 MG 24 hr capsule Take 150 mg by mouth daily with breakfast. As needed     albuterol (ACCUNEB) 0.63 MG/3ML nebulizer solution Take 1 ampule by nebulization as needed for wheezing. (Patient not taking: Reported on 07/25/2022)     ALBUTEROL IN Inhale into the lungs as needed. (Patient not taking: Reported on 07/25/2022)     clobetasol cream (TEMOVATE) 0.05 % Use bid to affected area for 2 weeks then 2 x weekly (Patient not taking: Reported on 07/25/2022) 30 g 0   esomeprazole (NEXIUM) 40 MG capsule Take 1 capsule (40 mg total) by mouth 2 (two) times daily before a meal. 180 capsule 1   fluconazole (DIFLUCAN) 150 MG tablet Take 1 now and 1 in 3 days if needed (Patient not taking: Reported on 07/25/2022) 2 tablet 1   tiZANidine (ZANAFLEX) 4 MG tablet as needed.  (Patient not taking: Reported on 07/25/2022)     traZODone (DESYREL) 50 MG tablet Take 50 mg by mouth at bedtime as needed. (Patient not taking: Reported on 07/25/2022)     No current facility-administered medications for this visit.    Past Medical History:  Diagnosis Date   Acid reflux    Allergy    Anxiety    Anxiety and depression    Arthritis    Asthma    Depression    Fibroid    Fibromyalgia    Fibromyalgia    Heart murmur    Irritable bowel syndrome    Migraines    Osteitis pubis (HCC)    Plantar fasciitis    PONV (postoperative nausea and vomiting)    Post-traumatic stress disorder    PTSD (post-traumatic stress disorder)    Ruptured cyst of ovary 2001   Ruptured cyst of ovary    Substance  abuse ([place])     Past Surgical History:  Procedure Laterality Date   ADENOIDECTOMY     COLONOSCOPY N/A 11/07/2016   Surgeon: Danie Binder, MD; edundant colon, pancolonic diverticulosis, internal hemorrhoids, negative random colon biopsies, recommended repeat in 10 years.   DILATION AND CURETTAGE OF UTERUS     DILITATION & CURRETTAGE/HYSTROSCOPY WITH NOVASURE ABLATION N/A 10/01/2016   Procedure: DILATATION & CURETTAGE/HYSTEROSCOPY WITH NOVASURE ABLATION AND EXCISION OF VULVAR/GLUTEAL  CYST;  Surgeon: Jonnie Kind, MD;  Location: [facility];  Service: Gynecology;  Laterality: N/A;   ESOPHAGOGASTRODUODENOSCOPY N/A 11/07/2016   Surgeon: Danie Binder, MD;  nonobstructing Schatzki's ring s/p dilation, mild gastritis biopsied, large duodenal diverticulum involving ampulla, s/p duodenal biopsies.  Duodenal biopsies benign, gastric biopsies with chronic inflammation, intestinal metaplasia, no H. pylori.   SAVORY DILATION N/A 11/07/2016   Procedure: SAVORY DILATION;  Surgeon: Danie Binder, MD;  Location: [facility];  Service: Endoscopy;  Laterality: N/A;   TONSILLECTOMY     TUBAL LIGATION  2016  WISDOM TOOTH EXTRACTION      Family History  Problem Relation Age of Onset   Emphysema Maternal Grandmother    Gout Maternal Grandmother    Diabetes Maternal Grandmother    Gout Father    Alcohol abuse Father    Arthritis Father    COPD Father    Depression Father    Drug abuse Father    Hyperlipidemia Father    Hypertension Father    Gout Mother    Arthritis Mother        RA   Diabetes Mother    Depression Mother    Heart disease Mother    Hyperlipidemia Mother    Hypertension Mother    Vision loss Mother    Kidney failure Mother    Thyroid disease Maternal Aunt    Diabetes Maternal Uncle    Breast cancer Paternal Aunt 59   Breast cancer Paternal Aunt 28   Breast cancer Other    Colon cancer Neg Hx    Inflammatory bowel disease Neg Hx    Celiac disease Neg Hx     Allergies  as of 07/25/2022 - Review Complete 07/25/2022  Allergen Reaction Noted   Doxycycline Nausea And Vomiting 10/27/2011   Flagyl [metronidazole] Itching 01/17/2012   Latex Itching 10/27/2011   Penicillins Itching 10/27/2011    Social History   Socioeconomic History   Marital status: Single    Spouse name: n/a   Number of children: 1   Years of education: 13   Highest education level: Not on file  Occupational History   Not on file  Tobacco Use   Smoking status: Never   Smokeless tobacco: Never  Vaping Use   Vaping Use: Never used  Substance and Sexual Activity   Alcohol use: Not Currently    Comment: WINE OCCASIONAL   Drug use: Yes    Types: Marijuana    Comment: daily   Sexual activity: Yes    Birth control/protection: Surgical    Comment: tubal and ablation  Other Topics Concern   Not on file  Social History Narrative   Single parent   One daughter - almost 2   Social Determinants of Health   Financial Resource Strain: High Risk (10/18/2020)   Overall Financial Resource Strain (CARDIA)    Difficulty of Paying Living Expenses: Very hard  Food Insecurity: No Food Insecurity (10/18/2020)   Hunger Vital Sign    Worried About Running Out of Food in the Last Year: Never true    Ran Out of Food in the Last Year: Never true  Transportation Needs: No Transportation Needs (10/18/2020)   PRAPARE - Hydrologist (Medical): No    Lack of Transportation (Non-Medical): No  Physical Activity: Inactive (10/18/2020)   Exercise Vital Sign    Days of Exercise per Week: 0 days    Minutes of Exercise per Session: 0 min  Stress: Stress Concern Present (10/18/2020)   [place]    Feeling of Stress : To some extent  Social Connections: Socially Isolated (10/18/2020)   Social Connection and Isolation Panel [NHANES]    Frequency of Communication with Friends and Family: More than three times a week     Frequency of Social Gatherings with Friends and Family: More than three times a week    Attends Religious Services: Never    Marine scientist or Organizations: No    Attends Archivist Meetings: Never  Marital Status: Never married     Review of Systems   Gen: + lack of appetite, weight loss. Denies fever, chills, anorexia. Denies fatigue, weakness CV: Denies chest pain, palpitations, syncope, peripheral edema, and claudication. Resp: Denies dyspnea at rest, cough, wheezing, coughing up blood, and pleurisy. GI: See HPI Derm: Denies rash, itching, dry skin Psych: Denies depression, anxiety, memory loss, confusion. No homicidal or suicidal ideation.  Heme: Denies bruising, bleeding, and enlarged lymph nodes.   Physical Exam   BP 124/86 (BP Location: Right Arm, Patient Position: Sitting, Cuff Size: Normal)   Pulse 92   Temp 97.7 F (36.5 C) (Temporal)   Ht 5' 6"$  (1.676 m)   Wt 237 lb 3.2 oz (107.6 kg)   SpO2 98%   BMI 38.29 kg/m   General:   Alert and oriented. No distress noted. Pleasant and cooperative.  Head:  Normocephalic and atraumatic. Eyes:  Conjuctiva clear without scleral icterus. Mouth:  Oral mucosa pink and moist. Good dentition. No lesions. Lungs:  Clear to auscultation bilaterally. No wheezes, rales, or rhonchi. No distress.  Heart:  S1, S2 present without murmurs appreciated.  Abdomen:  +BS, soft, non-distended.  TTP to epigastric region. No rebound or guarding. No HSM or masses noted. Rectal: deferred Msk:  Symmetrical without gross deformities. Normal posture. Extremities:  Without edema. Neurologic:  Alert and  oriented x4 Psych:  Alert and cooperative. Normal mood and affect.  Assessment  Brooke Eaton is a 46 y.o. female with a history of GERD, Schatzki's ring in 2008 s/p dilation, epigastric pain, IBS-D home anxiety and depression, fibromyalgia, substance abuse, presenting today for follow-up.  GERD, Nausea: GERD used to be  well-controlled on Nexium 40 mg daily however has been having some worsening symptoms recently.  States she has been seen by her PCP who recommended taking it twice daily.  She has been doing this occasionally however has been running out of medications prior to her does need a refill given her prescription was not updated.  She has been experiencing morning nausea for a few months with occasional vomiting episodes.  At times she is vomiting up her medications.  Has had a lack of appetite as well, may only eat 1 meal per day.  Symptoms could be exacerbated by her anxiety however suspect largely that nausea is secondary to uncontrolled reflux.  I have increased her Nexium to 40 mg twice daily and give a 90-day supply per her request.  Also advised that she may use famotidine 20 mg once daily as needed for breakthrough and for ongoing nausea.  Short supply of Zofran given to assist with morning nausea until GERD can be better controlled.  Had tenderness to palpation to epigastric region today.  We discussed GERD diet/lifestyle modifications as well.  If symptoms continue despite Zofran and increasing Nexium, will consider repeating EGD for further evaluation.  Advised to attempt to eat all meals throughout the day and not overeat to avoid worsening symptoms.  Gastroparesis remains within the differential however less likely given history.  Dysphagia: History of Schatzki's ring dilated in 2018.  Underwent MBSS and BPE in January of this year with evidence of esophageal dysmotility without any stricture or mass.  MBSS with normal oropharyngeal swallow however symptoms possibly related to referred esophageal symptoms or potential other causes such as neurological.  ENT evaluation was recommended if symptoms persist.  She initially declined ENT referral or repeat EGD for possible empiric dilation unless symptoms worsened. Currently without much issue. Denies any  current issues with dysphagia.  PLAN   Nexium 40 mg  twice daily, 90 day supply sent Famotidine 20 mg daily as needed for breakthrough. GERD diet/lifestyle modifications Zofran 58m ODT twice daily as needed for nausea.  Consider repeat EGD for further evaluation if no improvement with increase in Nexium and addition of famotidine. Follow up in 3-4 months.    CVenetia Night MSN, FNP-BC, AGACNP-BC [facility]  I have reviewed the note and agree with the APP's assessment as described in this progress note   CJuanda CrumbleK. CAbbey Chatters D.O. Gastroenterology and Hepatology [facility]

## 2022-09-03 ENCOUNTER — Other Ambulatory Visit: Payer: Self-pay | Admitting: Internal Medicine

## 2022-09-04 LAB — VITAMIN D 25 HYDROXY (VIT D DEFICIENCY, FRACTURES): Vit D, 25-Hydroxy: 27 ng/mL — ABNORMAL LOW (ref 30–100)

## 2022-09-04 LAB — COMPLETE METABOLIC PANEL WITH GFR
AG Ratio: 1.2 (calc) (ref 1.0–2.5)
ALT: 10 U/L (ref 6–29)
AST: 15 U/L (ref 10–35)
Albumin: 3.7 g/dL (ref 3.6–5.1)
Alkaline phosphatase (APISO): 75 U/L (ref 31–125)
BUN: 9 mg/dL (ref 7–25)
CO2: 24 mmol/L (ref 20–32)
Calcium: 8.5 mg/dL — ABNORMAL LOW (ref 8.6–10.2)
Chloride: 104 mmol/L (ref 98–110)
Creat: 0.83 mg/dL (ref 0.50–0.99)
Globulin: 3.1 g/dL (calc) (ref 1.9–3.7)
Glucose, Bld: 132 mg/dL — ABNORMAL HIGH (ref 65–99)
Potassium: 3.4 mmol/L — ABNORMAL LOW (ref 3.5–5.3)
Sodium: 140 mmol/L (ref 135–146)
Total Bilirubin: 0.3 mg/dL (ref 0.2–1.2)
Total Protein: 6.8 g/dL (ref 6.1–8.1)
eGFR: 89 mL/min/{1.73_m2} (ref >=60)

## 2022-09-04 LAB — CBC
HCT: 36.1 % (ref 35.0–45.0)
Hemoglobin: 11.8 g/dL (ref 11.7–15.5)
MCH: 28.6 pg (ref 27.0–33.0)
MCHC: 32.7 g/dL (ref 32.0–36.0)
MCV: 87.4 fL (ref 80.0–100.0)
MPV: 9.2 fL (ref 7.5–12.5)
Platelets: 397 10*3/uL (ref 140–400)
RBC: 4.13 10*6/uL (ref 3.80–5.10)
RDW: 14 % (ref 11.0–15.0)
WBC: 6.2 10*3/uL (ref 3.8–10.8)

## 2022-09-04 LAB — LIPID PANEL
Cholesterol: 194 mg/dL (ref <200)
HDL: 35 mg/dL — ABNORMAL LOW (ref >=50)
LDL Cholesterol (Calc): 133 mg/dL (calc) — ABNORMAL HIGH
Non-HDL Cholesterol (Calc): 159 mg/dL (calc) — ABNORMAL HIGH (ref <130)
Total CHOL/HDL Ratio: 5.5 (calc) — ABNORMAL HIGH (ref <5.0)
Triglycerides: 146 mg/dL (ref <150)

## 2022-09-04 LAB — TSH: TSH: 0.64 mIU/L

## 2022-09-05 ENCOUNTER — Ambulatory Visit: Payer: Medicare Other | Admitting: Internal Medicine

## 2022-09-14 ENCOUNTER — Other Ambulatory Visit: Payer: Self-pay | Admitting: Adult Health

## 2022-10-09 ENCOUNTER — Encounter: Payer: Self-pay | Admitting: Gastroenterology

## 2022-11-25 ENCOUNTER — Ambulatory Visit: Payer: Medicare Other | Admitting: Adult Health

## 2023-04-10 ENCOUNTER — Other Ambulatory Visit: Payer: Self-pay | Admitting: Gastroenterology

## 2023-04-10 DIAGNOSIS — R112 Nausea with vomiting, unspecified: Secondary | ICD-10-CM

## 2023-04-28 ENCOUNTER — Encounter: Payer: Self-pay | Admitting: Obstetrics & Gynecology

## 2023-04-28 ENCOUNTER — Other Ambulatory Visit (HOSPITAL_COMMUNITY)
Admission: RE | Admit: 2023-04-28 | Discharge: 2023-04-28 | Disposition: A | Discharge status: Home / Self Care | Payer: 59 | Source: Ambulatory Visit | Attending: Obstetrics & Gynecology | Admitting: Obstetrics & Gynecology

## 2023-04-28 ENCOUNTER — Ambulatory Visit (INDEPENDENT_AMBULATORY_CARE_PROVIDER_SITE_OTHER): Payer: 59 | Admitting: Obstetrics & Gynecology

## 2023-04-28 VITALS — BP 117/77 | HR 72 | Ht 66.0 in

## 2023-04-28 DIAGNOSIS — Z113 Encounter for screening for infections with a predominantly sexual mode of transmission: Secondary | ICD-10-CM | POA: Insufficient documentation

## 2023-04-28 DIAGNOSIS — N3000 Acute cystitis without hematuria: Secondary | ICD-10-CM

## 2023-04-28 DIAGNOSIS — R102 Pelvic and perineal pain: Secondary | ICD-10-CM | POA: Insufficient documentation

## 2023-04-28 MED ORDER — FLUCONAZOLE 150 MG PO TABS: ORAL_TABLET | ORAL | 1 refills | Status: DC

## 2023-04-28 MED ORDER — CIPROFLOXACIN HCL 500 MG PO TABS: 500.0000 mg | ORAL_TABLET | Freq: Two times a day (BID) | ORAL | 0 refills | Status: DC

## 2023-04-28 NOTE — Progress Notes (Signed)
Chief Complaint  Patient presents with   std screen      47 y.o. [id number] No LMP recorded. Patient has had an ablation. The current method of family planning is tubal ligation and endometrial ablation.  Outpatient Encounter Medications as of 04/28/2023  Medication Sig   ciprofloxacin (CIPRO) 500 MG tablet Take 1 tablet (500 mg total) by mouth 2 (two) times daily.   clobetasol cream (TEMOVATE) 0.05 % Use bid to affected area for 2 weeks then 2 x weekly   esomeprazole (NEXIUM) 40 MG capsule Take 1 capsule (40 mg total) by mouth 2 (two) times daily before a meal.   fluconazole (DIFLUCAN) 150 MG tablet Take 1 tablet today and repeat in 3 days   ondansetron (ZOFRAN-ODT) 4 MG disintegrating tablet DISSOLVE ONE TABLET BY MOUTH TWICE DAILY   pregabalin (LYRICA) 75 MG capsule Take 150 mg by mouth 3 (three) times daily.    venlafaxine XR (EFFEXOR-XR) 150 MG 24 hr capsule Take 150 mg by mouth daily with breakfast. As needed   albuterol (ACCUNEB) 0.63 MG/3ML nebulizer solution Take 1 ampule by nebulization as needed for wheezing. (Patient not taking: Reported on 07/25/2022)   ALBUTEROL IN Inhale into the lungs as needed. (Patient not taking: Reported on 07/25/2022)   fluconazole (DIFLUCAN) 150 MG tablet TAKE 1 TABLET BY MOUTH NOW AND TAKE 1 TABLET IN 3 DAYS IF NEEDED (Patient not taking: Reported on 04/28/2023)   tiZANidine (ZANAFLEX) 4 MG tablet as needed.  (Patient not taking: Reported on 07/25/2022)   traZODone (DESYREL) 50 MG tablet Take 50 mg by mouth at bedtime as needed. (Patient not taking: Reported on 07/25/2022)   No facility-administered encounter medications on file as of 04/28/2023.    Subjective Pt has been having dysuria for about 1 month or so No discharge +nitrites today-->cultured Concerned about STIs, nothing specific but concerned Past Medical History:  Diagnosis Date   Acid reflux    Allergy    Anxiety    Anxiety and depression    Arthritis    Asthma     Depression    Fibroid    Fibromyalgia    Fibromyalgia    Heart murmur    Irritable bowel syndrome    Migraines    Osteitis pubis (HCC)    Plantar fasciitis    PONV (postoperative nausea and vomiting)    Post-traumatic stress disorder    PTSD (post-traumatic stress disorder)    Ruptured cyst of ovary 2001   Ruptured cyst of ovary    Substance abuse (HCC)     Past Surgical History:  Procedure Laterality Date   ADENOIDECTOMY     COLONOSCOPY N/A 11/07/2016   Surgeon: West Bali, MD; edundant colon, pancolonic diverticulosis, internal hemorrhoids, negative random colon biopsies, recommended repeat in 10 years.   DILATION AND CURETTAGE OF UTERUS     DILITATION & CURRETTAGE/HYSTROSCOPY WITH NOVASURE ABLATION N/A 10/01/2016   Procedure: DILATATION & CURETTAGE/HYSTEROSCOPY WITH NOVASURE ABLATION AND EXCISION OF VULVAR/GLUTEAL  CYST;  Surgeon: Tilda Burrow, MD;  Location: [facility];  Service: Gynecology;  Laterality: N/A;   ESOPHAGOGASTRODUODENOSCOPY N/A 11/07/2016   Surgeon: West Bali, MD;  nonobstructing Schatzki's ring s/p dilation, mild gastritis biopsied, large duodenal diverticulum involving ampulla, s/p duodenal biopsies.  Duodenal biopsies benign, gastric biopsies with chronic inflammation, intestinal metaplasia, no H. pylori.   SAVORY DILATION N/A 11/07/2016   Procedure: SAVORY DILATION;  Surgeon: West Bali, MD;  Location: [facility];  Service: Endoscopy;  Laterality: N/A;   TONSILLECTOMY     TUBAL LIGATION  2016   WISDOM TOOTH EXTRACTION      OB History     Gravida  3   Para  1   Term  1   Preterm      AB  2   Living  1      SAB  2   IAB      Ectopic      Multiple      Live Births  1        Obstetric Comments  PT HAD ONE STILL BIRTH         Allergies  Allergen Reactions   Doxycycline Nausea And Vomiting    Cannot tolerate oral doxycycline    Flagyl [Metronidazole] Itching   Latex Itching   Penicillins Itching    Has patient  had a PCN reaction causing immediate rash, facial/tongue/throat swelling, SOB or lightheadedness with hypotension: NO Has patient had a PCN reaction causing severe rash involving mucus membranes or skin necrosis: No Has patient had a PCN reaction that required hospitalization No Has patient had a PCN reaction occurring within the last 10 years: No If all of the above answers are "NO", then may proceed with Cephalosporin use.     Social History   Socioeconomic History   Marital status: Single    Spouse name: n/a   Number of children: 1   Years of education: 13   Highest education level: Not on file  Occupational History   Not on file  Tobacco Use   Smoking status: Never   Smokeless tobacco: Never  Vaping Use   Vaping status: Never Used  Substance and Sexual Activity   Alcohol use: Not Currently    Comment: WINE OCCASIONAL   Drug use: Yes    Types: Marijuana    Comment: daily   Sexual activity: Yes    Birth control/protection: Surgical    Comment: tubal and ablation  Other Topics Concern   Not on file  Social History Narrative   Single parent   One daughter - almost 2   Social Determinants of Health   Financial Resource Strain: High Risk (10/18/2020)   Overall Financial Resource Strain (CARDIA)    Difficulty of Paying Living Expenses: Very hard  Food Insecurity: No Food Insecurity (10/18/2020)   Hunger Vital Sign    Worried About Running Out of Food in the Last Year: Never true    Ran Out of Food in the Last Year: Never true  Transportation Needs: No Transportation Needs (10/18/2020)   PRAPARE - Administrator, Civil Service (Medical): No    Lack of Transportation (Non-Medical): No  Physical Activity: Inactive (10/18/2020)   Exercise Vital Sign    Days of Exercise per Week: 0 days    Minutes of Exercise per Session: 0 min  Stress: Stress Concern Present (10/18/2020)   [employer] of Occupational Health - Occupational Stress Questionnaire    Feeling  of Stress : To some extent  Social Connections: Socially Isolated (10/18/2020)   Social Connection and Isolation Panel [NHANES]    Frequency of Communication with Friends and Family: More than three times a week    Frequency of Social Gatherings with Friends and Family: More than three times a week    Attends Religious Services: Never    Database administrator or Organizations: No    Attends Banker Meetings: Never    Marital Status: Never married  Family History  Problem Relation Age of Onset   Emphysema Maternal Grandmother    Gout Maternal Grandmother    Diabetes Maternal Grandmother    Gout Father    Alcohol abuse Father    Arthritis Father    COPD Father    Depression Father    Drug abuse Father    Hyperlipidemia Father    Hypertension Father    Gout Mother    Arthritis Mother        RA   Diabetes Mother    Depression Mother    Heart disease Mother    Hyperlipidemia Mother    Hypertension Mother    Vision loss Mother    Kidney failure Mother    Thyroid disease Maternal Aunt    Diabetes Maternal Uncle    Breast cancer Paternal Aunt 89   Breast cancer Paternal Aunt 24   Breast cancer Other    Colon cancer Neg Hx    Inflammatory bowel disease Neg Hx    Celiac disease Neg Hx     Medications:       Current Outpatient Medications:    ciprofloxacin (CIPRO) 500 MG tablet, Take 1 tablet (500 mg total) by mouth 2 (two) times daily., Disp: 14 tablet, Rfl: 0   clobetasol cream (TEMOVATE) 0.05 %, Use bid to affected area for 2 weeks then 2 x weekly, Disp: 30 g, Rfl: 0   esomeprazole (NEXIUM) 40 MG capsule, Take 1 capsule (40 mg total) by mouth 2 (two) times daily before a meal., Disp: 180 capsule, Rfl: 1   fluconazole (DIFLUCAN) 150 MG tablet, Take 1 tablet today and repeat in 3 days, Disp: 2 tablet, Rfl: 1   ondansetron (ZOFRAN-ODT) 4 MG disintegrating tablet, DISSOLVE ONE TABLET BY MOUTH TWICE DAILY, Disp: 30 tablet, Rfl: 1   pregabalin (LYRICA) 75 MG  capsule, Take 150 mg by mouth 3 (three) times daily. , Disp: , Rfl:    venlafaxine XR (EFFEXOR-XR) 150 MG 24 hr capsule, Take 150 mg by mouth daily with breakfast. As needed, Disp: , Rfl:    albuterol (ACCUNEB) 0.63 MG/3ML nebulizer solution, Take 1 ampule by nebulization as needed for wheezing. (Patient not taking: Reported on 07/25/2022), Disp: , Rfl:    ALBUTEROL IN, Inhale into the lungs as needed. (Patient not taking: Reported on 07/25/2022), Disp: , Rfl:    fluconazole (DIFLUCAN) 150 MG tablet, TAKE 1 TABLET BY MOUTH NOW AND TAKE 1 TABLET IN 3 DAYS IF NEEDED (Patient not taking: Reported on 04/28/2023), Disp: 2 tablet, Rfl: 1   tiZANidine (ZANAFLEX) 4 MG tablet, as needed.  (Patient not taking: Reported on 07/25/2022), Disp: , Rfl:    traZODone (DESYREL) 50 MG tablet, Take 50 mg by mouth at bedtime as needed. (Patient not taking: Reported on 07/25/2022), Disp: , Rfl:   Objective Blood pressure 117/77, pulse 72, height 5\' 6"  (1.676 m).  General WDWN female NAD Vulva:  normal appearing vulva with no masses, tenderness or lesions Vagina:  normal mucosa, no discharge Cervix:  Normal no lesions Uterus:  normal size, contour, position, consistency, mobility, non-tender Adnexa: ovaries:present,  normal adnexa in size, nontender and no masses   Pertinent ROS  No nausea, vomiting or diarrhea Nor fever chills or other constitutional symptoms   Labs or studies UA noted Cutures are pending    Impression + Management Plan: Diagnoses this Encounter::   ICD-10-CM   1. Acute cystitis without hematuria  N30.00     2. Screen for STD (sexually transmitted disease)  Z11.3 Urine Culture    Cervicovaginal ancillary only( [place])    HIV Antibody (routine testing w rflx)    RPR    Hepatitis B Surface AntiGEN    CANCELED: POC Urinalysis Dipstick OB    3. Pelvic pain  R10.2 Urine Culture    Cervicovaginal ancillary only( )    HIV Antibody (routine testing w rflx)    RPR     Hepatitis B Surface AntiGEN    CANCELED: POC Urinalysis Dipstick OB        Medications prescribed during  this encounter: Meds ordered this encounter  Medications   ciprofloxacin (CIPRO) 500 MG tablet    Sig: Take 1 tablet (500 mg total) by mouth 2 (two) times daily.    Dispense:  14 tablet    Refill:  0   fluconazole (DIFLUCAN) 150 MG tablet    Sig: Take 1 tablet today and repeat in 3 days    Dispense:  2 tablet    Refill:  1    Labs or Scans Ordered during this encounter: Orders Placed This Encounter  Procedures   Urine Culture   HIV Antibody (routine testing w rflx)   RPR   Hepatitis B Surface AntiGEN      Follow up Return if symptoms worsen or fail to improve.

## 2023-04-29 ENCOUNTER — Other Ambulatory Visit: Payer: Self-pay | Admitting: Obstetrics & Gynecology

## 2023-04-29 LAB — CERVICOVAGINAL ANCILLARY ONLY
Bacterial Vaginitis (gardnerella): POSITIVE — AB
Candida Glabrata: NEGATIVE
Candida Vaginitis: NEGATIVE
Chlamydia: NEGATIVE
Comment: NEGATIVE
Comment: NEGATIVE
Comment: NEGATIVE
Comment: NEGATIVE
Comment: NEGATIVE
Comment: NORMAL
Neisseria Gonorrhea: NEGATIVE
Trichomonas: NEGATIVE

## 2023-04-29 LAB — RPR: RPR Ser Ql: NONREACTIVE

## 2023-04-29 LAB — HIV ANTIBODY (ROUTINE TESTING W REFLEX): HIV Screen 4th Generation wRfx: NONREACTIVE

## 2023-04-29 LAB — HEPATITIS B SURFACE ANTIGEN: Hepatitis B Surface Ag: NEGATIVE

## 2023-04-29 MED ORDER — METRONIDAZOLE 0.75 % VA GEL: 1.0000 | Freq: Every day | VAGINAL | 5 refills | Status: DC

## 2023-04-30 LAB — URINE CULTURE: Organism ID, Bacteria: NO GROWTH

## 2023-05-05 ENCOUNTER — Ambulatory Visit (INDEPENDENT_AMBULATORY_CARE_PROVIDER_SITE_OTHER): Payer: 59

## 2023-05-05 ENCOUNTER — Ambulatory Visit (INDEPENDENT_AMBULATORY_CARE_PROVIDER_SITE_OTHER): Payer: 59 | Admitting: Podiatry

## 2023-05-05 ENCOUNTER — Encounter: Payer: Self-pay | Admitting: Podiatry

## 2023-05-05 DIAGNOSIS — S99912A Unspecified injury of left ankle, initial encounter: Secondary | ICD-10-CM

## 2023-05-05 DIAGNOSIS — M792 Neuralgia and neuritis, unspecified: Secondary | ICD-10-CM

## 2023-05-05 DIAGNOSIS — S99922A Unspecified injury of left foot, initial encounter: Secondary | ICD-10-CM | POA: Diagnosis not present

## 2023-05-05 MED ORDER — MELOXICAM 7.5 MG PO TABS: 7.5000 mg | ORAL_TABLET | Freq: Every day | ORAL | 0 refills | Status: AC

## 2023-05-05 NOTE — Progress Notes (Unsigned)
Chief Complaint  Patient presents with   Ankle Pain    Lateral ankle left - injury a few years ago when she twisted her ankle when she stepped off a porch, xrayed then-said had stress fracture, pain since, but has intensified over the past few months, now radiating dorsal midfoot/forefoot and back of leg to calf, tried muscle/NSAID rub   HPI: 47 y.o. female presents today with concern of a past history of a left ankle sprain.  Notes that it was diagnosed a couple years ago as a stress fracture.  She has had some issues with this area ever since that injury but states that has been progressively worsening.  She also notes a burning pain along the outer ankle and dorsal lateral foot over the past couple months.  Denies any recent injury or bruising.  Past Medical History:  Diagnosis Date   Acid reflux    Allergy    Anxiety    Anxiety and depression    Arthritis    Asthma    Depression    Fibroid    Fibromyalgia    Fibromyalgia    Heart murmur    Irritable bowel syndrome    Migraines    Osteitis pubis (HCC)    Plantar fasciitis    PONV (postoperative nausea and vomiting)    Post-traumatic stress disorder    PTSD (post-traumatic stress disorder)    Ruptured cyst of ovary 2001   Ruptured cyst of ovary    Substance abuse (HCC)     Past Surgical History:  Procedure Laterality Date   ADENOIDECTOMY     COLONOSCOPY N/A 11/07/2016   Surgeon: West Bali, MD; edundant colon, pancolonic diverticulosis, internal hemorrhoids, negative random colon biopsies, recommended repeat in 10 years.   DILATION AND CURETTAGE OF UTERUS     DILITATION & CURRETTAGE/HYSTROSCOPY WITH NOVASURE ABLATION N/A 10/01/2016   Procedure: DILATATION & CURETTAGE/HYSTEROSCOPY WITH NOVASURE ABLATION AND EXCISION OF VULVAR/GLUTEAL  CYST;  Surgeon: Tilda Burrow, MD;  Location: [facility];  Service: Gynecology;  Laterality: N/A;   ESOPHAGOGASTRODUODENOSCOPY N/A 11/07/2016   Surgeon: West Bali, MD;   nonobstructing Schatzki's ring s/p dilation, mild gastritis biopsied, large duodenal diverticulum involving ampulla, s/p duodenal biopsies.  Duodenal biopsies benign, gastric biopsies with chronic inflammation, intestinal metaplasia, no H. pylori.   SAVORY DILATION N/A 11/07/2016   Procedure: SAVORY DILATION;  Surgeon: West Bali, MD;  Location: [facility];  Service: Endoscopy;  Laterality: N/A;   TONSILLECTOMY     TUBAL LIGATION  2016   WISDOM TOOTH EXTRACTION      Allergies  Allergen Reactions   Doxycycline Nausea And Vomiting    Cannot tolerate oral doxycycline    Flagyl [Metronidazole] Itching   Latex Itching   Penicillins Itching    Has patient had a PCN reaction causing immediate rash, facial/tongue/throat swelling, SOB or lightheadedness with hypotension: NO Has patient had a PCN reaction causing severe rash involving mucus membranes or skin necrosis: No Has patient had a PCN reaction that required hospitalization No Has patient had a PCN reaction occurring within the last 10 years: No If all of the above answers are "NO", then may proceed with Cephalosporin use.     Review of Systems  Musculoskeletal:  Positive for joint pain.     Physical Exam:  General: The patient is alert and oriented x3 in no acute distress.  Dermatology: Skin is warm, dry and supple bilateral lower extremities. Interspaces are clear of maceration and debris.  Vascular: Palpable pedal pulses bilaterally. Capillary refill within normal limits.  No appreciable edema.  No erythema or calor.  No ecchymosis noted  Neurological: Light touch sensation grossly intact bilateral feet.  Positive Tinel's sign with percussion of the intermediate dorsal cutaneous nerve on the left.  Musculoskeletal Exam: Pain on palpation anterolateral aspect of the left ankle joint.  No crepitus with range of motion of the ankle or subtalar joint.  Minimal pain with range of motion of these joints.  Radiographic Exam  (left foot and ankle, 3 weightbearing views of the foot and 2 weightbearing views of ankle, 05/05/2023):  Normal osseous mineralization. Joint spaces preserved.  No fractures or osseous irregularities noted.  Assessment/Plan of Care: 1. Injury of left foot, initial encounter   2. Injury of left ankle, initial encounter      Meds ordered this encounter  Medications   meloxicam (MOBIC) 7.5 MG tablet    Sig: Take 1 tablet (7.5 mg total) by mouth daily.    Dispense:  30 tablet    Refill:  0   Discussed clinical findings with patient today.  With the patient's verbal consent, a corticosteroid injection was adminstered to the anterolateral left ankle joint.  This consisted of a mixture of 1% lidocaine plain, 0.5% sensorcaine plain, and Kenalog-10 for a total of 1.25cc's administered.  A small portion of the steroid injection was then fanned out in the adjacent area to also cover the intermediate dorsal cutaneous nerve.  Bandaid applied. Patient tolerated this well.   An Ace wrap was applied for additional compression and support.  She can rewrap this during the day when she will be weightbearing.  Remove at bedtime  I will send in prescription for meloxicam 7.5 mg 1 tablet p.o. daily for the next 30 days.  Follow-up in 4 weeks or as needed   Clerance Lav, DPM, FACFAS [facility]     [address] [place], [place] [address]                Office [phone]  Fax [phone]

## 2023-05-08 IMAGING — RF DG ESOPHAGUS
9 series · 12 of 24 positions shown · non-contrast
Comparison: None

CLINICAL DATA: Cervical dysphagia, choking on pills and foods,
history acid reflux

EXAM:
ESOPHOGRAM / BARIUM SWALLOW / BARIUM TABLET STUDY
TECHNIQUE: Combined double contrast and single contrast examination performed
using effervescent crystals, thick barium liquid, and thin barium
liquid. The patient was observed with fluoroscopy swallowing a 13 mm
barium sulphate tablet.
FLUOROSCOPY TIME:  Fluoroscopy Time:  1 minute 12 seconds
Radiation Exposure Index (if provided by the fluoroscopic device):
70.6 mGy
Number of Acquired Spot Images: None

[Series 2: cp_standard · 0.17mm/px · 1 of 52 frames shown (1 of 9)]
[frame 27/52]
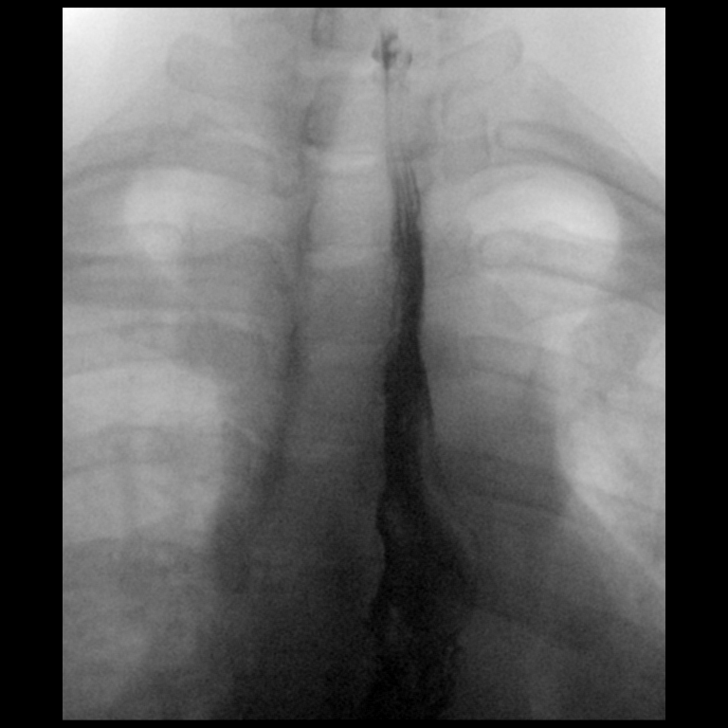

[Series 3: cp_standard · 0.17mm/px · 1 of 113 frames shown (2 of 9)]
[frame 33/113]
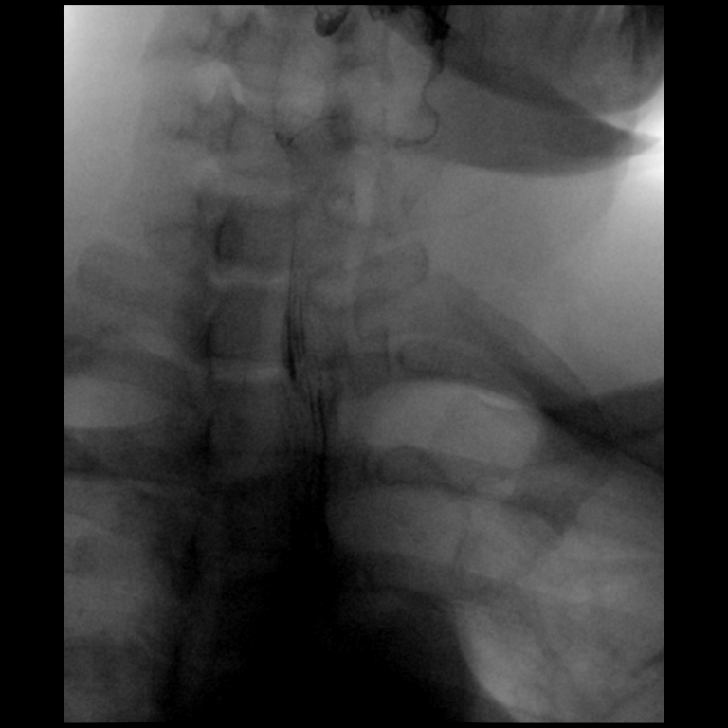

[Series 4: cp_standard · 0.18mm/px · 2 of 104 frames shown (3 of 9)]
[frame 16/104]
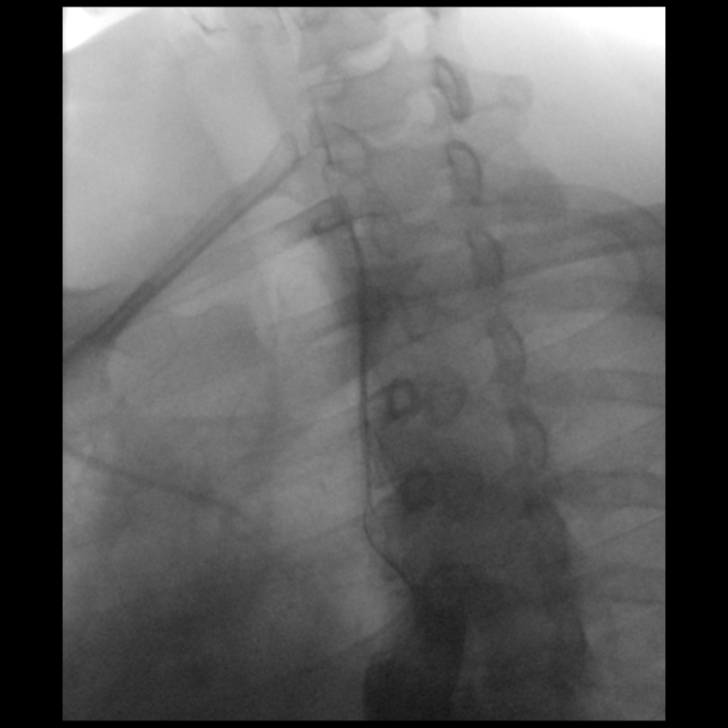
[frame 89/104]
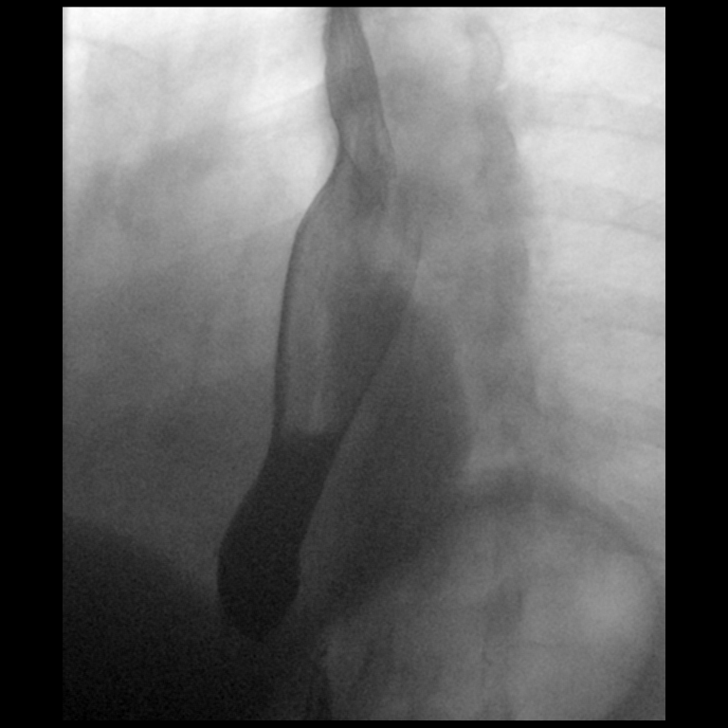

[Series 5: cp_standard · 0.18mm/px · 1 of 123 frames shown (4 of 9)]
[frame 62/123]
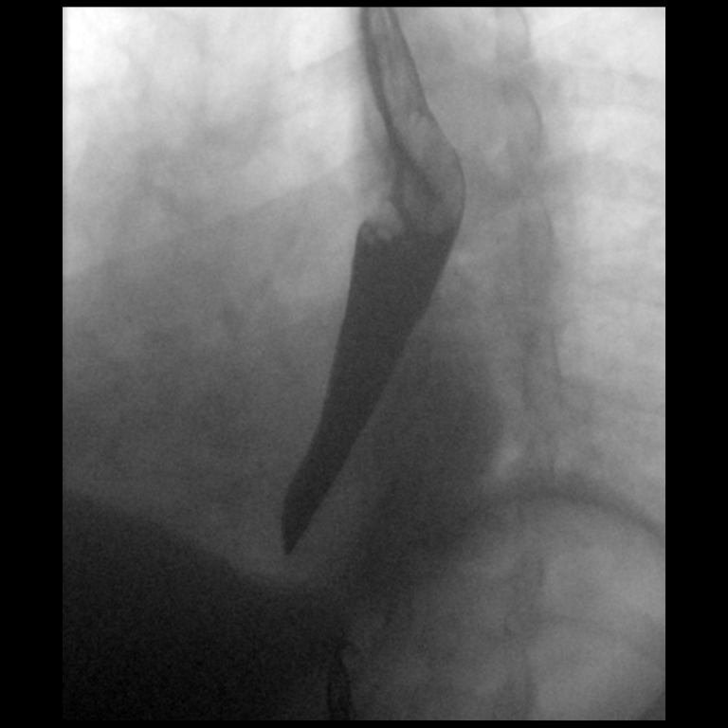

[Series 6: cp_standard · 0.26mm/px · 1 of 210 frames shown (5 of 9)]
[frame 32/210]
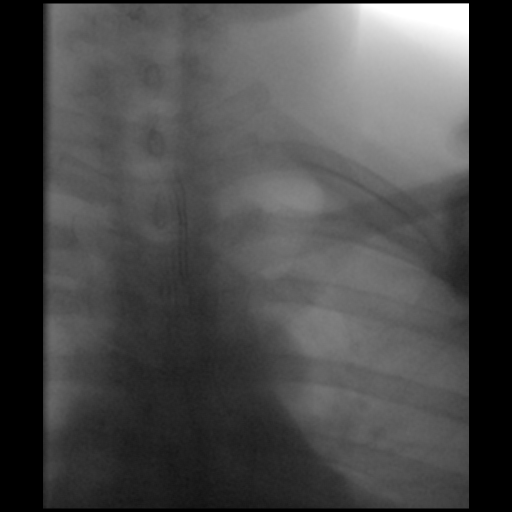

[Series 7: cp_standard · 0.26mm/px · 2 of 109 frames shown (6 of 9)]
[frame 17/109]
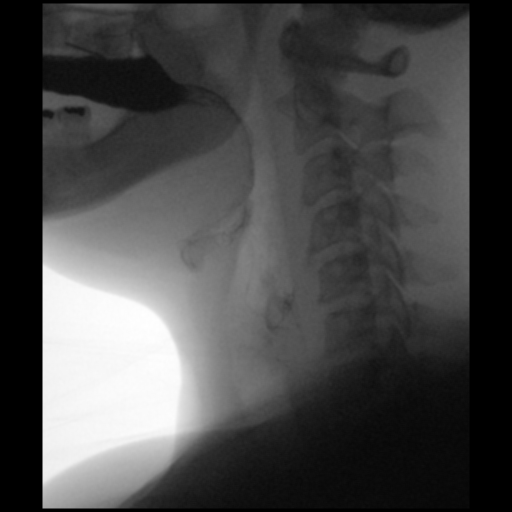
[frame 93/109]
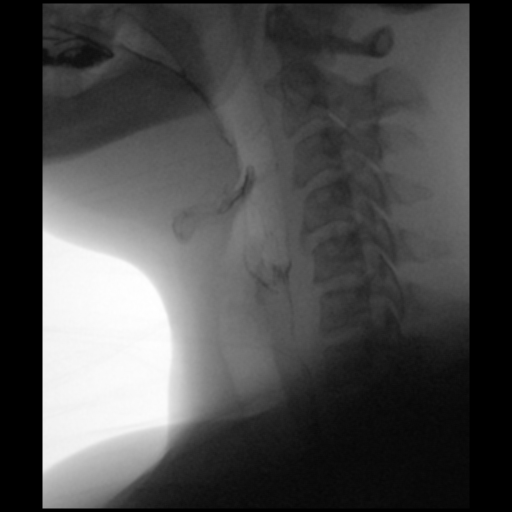

[Series 8: cp_standard · 0.17mm/px · 1 of 13 frames shown (7 of 9)]
[frame 12/13]
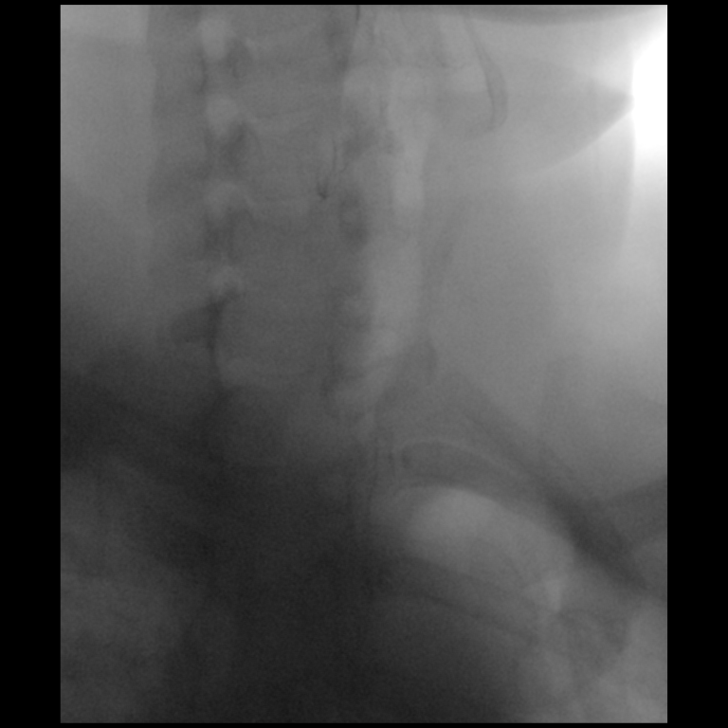

[Series 9: cp_standard · 0.17mm/px · 1 of 110 frames shown (8 of 9)]
[frame 32/110]
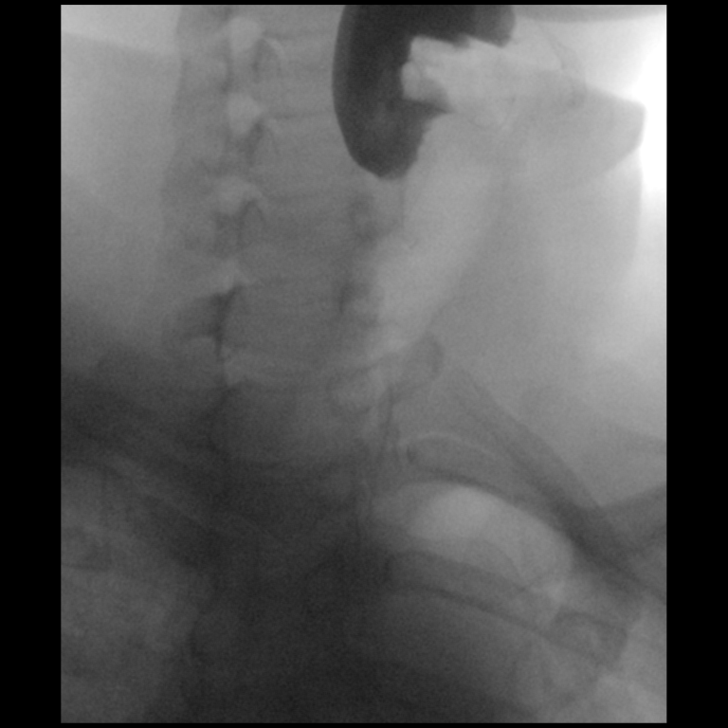

[Series 10: cp_standard · 0.17mm/px · 2 of 119 frames shown (9 of 9)]
[frame 18/119]
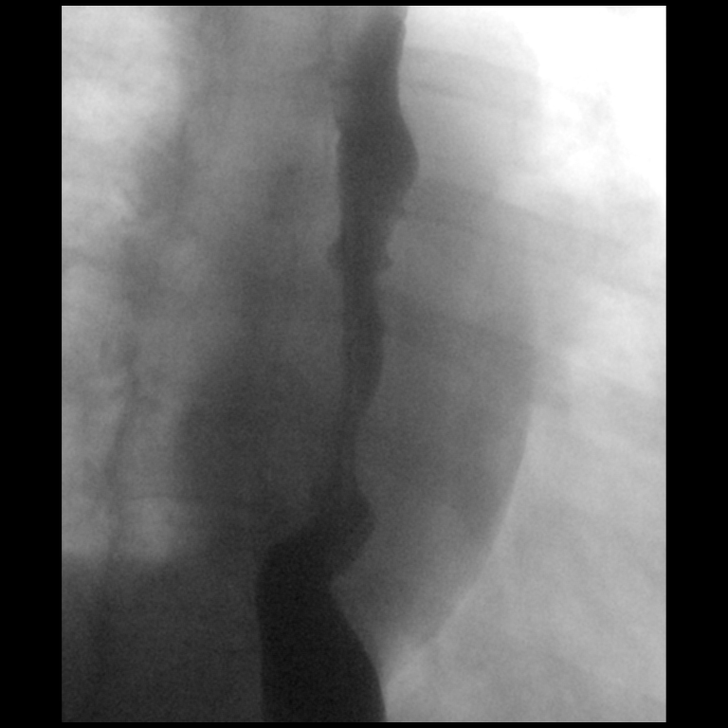
[frame 119/119]
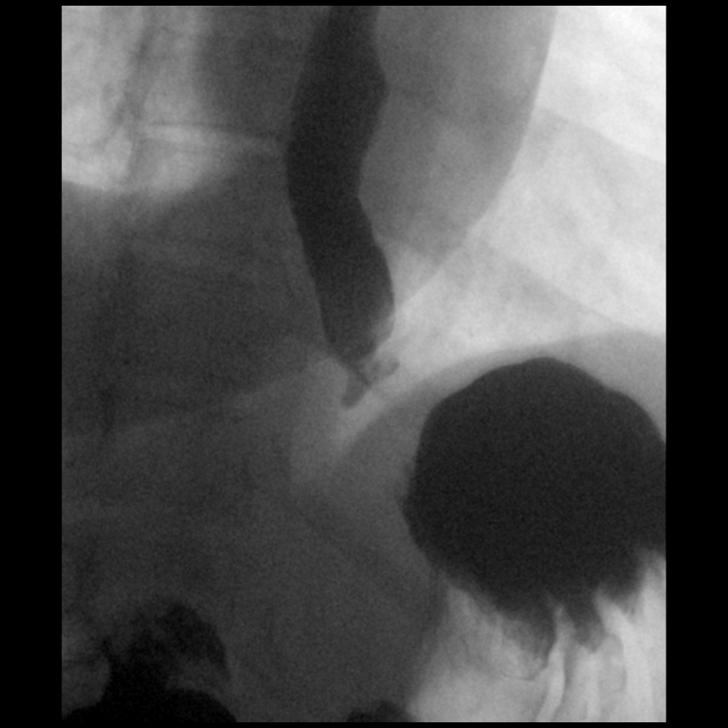

[12 of 24 positions shown; findings below may reference images not displayed]

FINDINGS: Esophageal distention: Normal without mass or stricture

Filling defects:  None

12.5 mm barium tablet: Previously administered during swallowing
function study, not reassessed

Motility: Mild diffuse impairment of esophageal motility with
incomplete clearance of barium by primary peristaltic waves and
scattered secondary/tertiary waves

Mucosa:  Smooth without irregularity or ulceration

Hypopharynx/cervical esophagus: No laryngeal penetration or
aspiration. No residuals.

Hiatal hernia:  Absent

GE reflux:  Not witnessed during exam

Other:  N/A
IMPRESSION: Esophageal dysmotility.

No evidence of esophageal mass, stricture, or hiatal hernia.

## 2023-05-27 ENCOUNTER — Ambulatory Visit (HOSPITAL_COMMUNITY)
Admission: RE | Admit: 2023-05-27 | Discharge: 2023-05-27 | Disposition: A | Discharge status: Home / Self Care | Payer: 59 | Source: Ambulatory Visit | Attending: Gastroenterology | Admitting: Gastroenterology

## 2023-05-27 ENCOUNTER — Telehealth: Payer: Self-pay | Admitting: *Deleted

## 2023-05-27 ENCOUNTER — Ambulatory Visit (INDEPENDENT_AMBULATORY_CARE_PROVIDER_SITE_OTHER): Payer: 59 | Admitting: Gastroenterology

## 2023-05-27 ENCOUNTER — Other Ambulatory Visit: Payer: Self-pay | Admitting: *Deleted

## 2023-05-27 ENCOUNTER — Encounter: Payer: Self-pay | Admitting: *Deleted

## 2023-05-27 ENCOUNTER — Encounter: Payer: Self-pay | Admitting: Gastroenterology

## 2023-05-27 VITALS — BP 127/85 | HR 87 | Temp 98.6°F | Ht 66.0 in | Wt 225.4 lb

## 2023-05-27 DIAGNOSIS — K219 Gastro-esophageal reflux disease without esophagitis: Secondary | ICD-10-CM | POA: Diagnosis not present

## 2023-05-27 DIAGNOSIS — R1030 Lower abdominal pain, unspecified: Secondary | ICD-10-CM | POA: Insufficient documentation

## 2023-05-27 DIAGNOSIS — R112 Nausea with vomiting, unspecified: Secondary | ICD-10-CM

## 2023-05-27 DIAGNOSIS — R1013 Epigastric pain: Secondary | ICD-10-CM

## 2023-05-27 DIAGNOSIS — R10819 Abdominal tenderness, unspecified site: Secondary | ICD-10-CM

## 2023-05-27 DIAGNOSIS — R634 Abnormal weight loss: Secondary | ICD-10-CM | POA: Diagnosis not present

## 2023-05-27 DIAGNOSIS — K920 Hematemesis: Secondary | ICD-10-CM

## 2023-05-27 DIAGNOSIS — R1010 Upper abdominal pain, unspecified: Secondary | ICD-10-CM | POA: Insufficient documentation

## 2023-05-27 DIAGNOSIS — F1290 Cannabis use, unspecified, uncomplicated: Secondary | ICD-10-CM

## 2023-05-27 DIAGNOSIS — R630 Anorexia: Secondary | ICD-10-CM

## 2023-05-27 MED ORDER — ONDANSETRON 4 MG PO TBDP: 4.0000 mg | ORAL_TABLET | Freq: Three times a day (TID) | ORAL | 1 refills | Status: DC | PRN

## 2023-05-27 MED ORDER — ESOMEPRAZOLE MAGNESIUM 40 MG PO CPDR: 40.0000 mg | DELAYED_RELEASE_CAPSULE | Freq: Two times a day (BID) | ORAL | 1 refills | Status: AC

## 2023-05-27 MED ORDER — IOHEXOL 300 MG/ML  SOLN
100.0000 mL | Freq: Once | INTRAMUSCULAR | Status: AC | PRN
  Administered 2023-05-27: 100 mL via INTRAVENOUS

## 2023-05-27 MED ORDER — SUCRALFATE 1 G PO TABS
1.0000 g | ORAL_TABLET | Freq: Four times a day (QID) | ORAL | 1 refills | Status: AC
Start: 2023-05-27 — End: ?

## 2023-05-27 NOTE — H&P (View-Only) (Signed)
GI Office Note    Referring Provider: Fleet Contras, MD Primary Care Physician:  Fleet Contras, MD Primary Gastroenterologist: Hennie Duos. Marletta Lor, DO  Date:  05/27/2023  ID:  Brooke Eaton, DOB 14-Oct-1975, MRN [id number]   Chief Complaint   Chief Complaint  Patient presents with   Emesis    Pt has been vomiting since Thursday. Pt has chills. Smelling stuff makes her have nausea. I asked her did she get tested for the flu she stated no   History of Present Illness  Brooke Eaton is a 47 y.o. female with a history of IBS/D, GERD, epigastric pain, Schatzki's ring in 2018 s/p dilation, PTSD, substance abuse, migraines, anxiety/depression, and arthritis presenting today with complaint of vomiting x5 days.   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 Pylori  CT A/P July 2020: -No findings to explain epigastric pain, nausea, or vomiting  Previous GERD treatment: Various trials of PPIs including Prevacid (diarrhea), Nexium (helpful), omeprazole (failed)  Office visit 07/26/2021.  GERD reportedly well-controlled with Nexium 40 mg once daily.  Denied any abdominal pain, nausea, or vomiting.  Reported recent issues of dysphagia over the last year having to move her head a certain way to swallow.  Occurs with solids and liquids, sometimes even saliva.  Denied any constipation, bowels moving well.  No weight loss, melena, or BRBPR.  MBSS with BPE ordered.  Advised continue Nexium 40 mg once daily, reinforced GERD diet.  MBSS 08/16/2021: Normal oropharyngeal swallow in setting of upper and lower partials resulting in piecemeal deglutition of solids.  Exhibits  an effortful swallow, swallows however function and physiology appear within normal limits.  Esophageal sweep revealed brief stasis of barium tablet in the distal esophagus but cleared with second sip of liquid.  This is when patient reported globus sensation in her pharynx.  Suspected that referred esophageal symptoms or etiology of patient feeling she needs to move her head in a certain way to swallow pop only secondary to upper partial or treatment to anatomical or neurological changes that are beyond the scope of assessment.  ENT consult recommended if symptoms persist.  Regular textures and thin liquids with reflux precautions recommended.   BPE 08/16/2021:  -No evidence of esophageal mass, stricture, or filling defect.   -Mild diffuse impairment of esophageal motility with incomplete clearance of barium by primary peristaltic waves.   -No evidence of hiatal hernia.  No reflux witnessed during exam. -ENT consult and additional EGD for empiric dilation of possible esophageal web offered however patient would like to see if symptoms worsened prior to proceeding.  Last office visit 07/25/2022.  Occasional issues with nausea, weeks, nausea at times and was still throughout her medications.  Happens a few times a week and will also have dry heaves.  Unsure if anxiety.  Reports reflux controlled with Nexium twice daily.  Swallowing better than previously.  Denied any new medications.  Denied constipation, having 2-3 soft bowel movements daily without straining.  Not having good appetite the past few months.  Prednisone gives her [facility] but otherwise no appetite.  Trying to work toward losing weight.  Advised Nexium 40 mg twice daily, famotidine 20 mg as needed for breakthrough.  Provided Zofran 4 mg ODT twice daily as needed.  Advise EGD  for further evaluation if no improvement in symptoms.  Today:  Reports dark emesis. Threw up everything she has tried, liquids including pepto bismol she would throw up. She  has tried to lay down in order to try to rest. She reports her emesis has been very dark. Vomited this morning. Also has a dry heave. Has  sudden nauseas feeling like she wants to throw up. Last meal was early saturday and unable to keep anything down since then. Has not been able to take her nexium because it is a capsule. Has some abdominal tenderness. Can feel coolness going down. No dysphagia. No appetite. Has not had a good appetite since she had COVID in 2020. Has had a lot of weight loss.  Very tender to touch in epigastric region she reports.   Denis chest pain or shortness of breath. No syncope. Denies melena or brbpr.   She reports she just had a sinus infection. She reports this change in her stomach came after she started the antibiotics. Reports some chills but no fevers.   Reports feeling emotional due to inability to take her anxiety medications.   Wt Readings from Last 3 Encounters:  05/27/23 225 lb 6.4 oz (102.2 kg)  07/25/22 237 lb 3.2 oz (107.6 kg)  06/14/22 239 lb (108.4 kg)    Current Outpatient Medications  Medication Sig Dispense Refill   albuterol (ACCUNEB) 0.63 MG/3ML nebulizer solution Take 1 ampule by nebulization as needed for wheezing.     ALBUTEROL IN Inhale into the lungs as needed.     esomeprazole (NEXIUM) 40 MG capsule Take 1 capsule (40 mg total) by mouth 2 (two) times daily before a meal. 180 capsule 1   ondansetron (ZOFRAN-ODT) 4 MG disintegrating tablet DISSOLVE ONE TABLET BY MOUTH TWICE DAILY 30 tablet 1   pregabalin (LYRICA) 75 MG capsule Take 150 mg by mouth 3 (three) times daily.      solifenacin (VESICARE) 5 MG tablet Take 5 mg by mouth daily.     venlafaxine XR (EFFEXOR-XR) 150 MG 24 hr capsule Take 150 mg by mouth daily with breakfast. As needed     meloxicam (MOBIC) 7.5 MG tablet Take 1 tablet (7.5 mg total) by mouth daily. (Patient not taking: Reported on 05/27/2023) 30 tablet 0   No current facility-administered medications for this visit.     Past Medical History:  Diagnosis Date   Acid reflux    Allergy    Anxiety    Anxiety and depression    Arthritis    Asthma    Depression    Fibroid    Fibromyalgia    Fibromyalgia    Heart murmur    Irritable bowel syndrome    Migraines    Osteitis pubis (HCC)    Plantar fasciitis    PONV (postoperative nausea and vomiting)    Post-traumatic stress disorder    PTSD (post-traumatic stress disorder)    Ruptured cyst of ovary 2001   Ruptured cyst of ovary    Substance abuse (HCC)     Past Surgical History:  Procedure Laterality Date   ADENOIDECTOMY     COLONOSCOPY N/A 11/07/2016   Surgeon: West Bali, MD; edundant colon, pancolonic diverticulosis, internal hemorrhoids, negative random colon biopsies, recommended repeat in 10 years.   DILATION AND CURETTAGE OF UTERUS     DILITATION & CURRETTAGE/HYSTROSCOPY WITH NOVASURE ABLATION N/A 10/01/2016   Procedure: DILATATION & CURETTAGE/HYSTEROSCOPY WITH NOVASURE ABLATION AND EXCISION OF VULVAR/GLUTEAL  CYST;  Surgeon: Tilda Burrow, MD;  Location: [facility];  Service: Gynecology;  Laterality: N/A;   ESOPHAGOGASTRODUODENOSCOPY N/A 11/07/2016   Surgeon: West Bali, MD;  nonobstructing Schatzki's ring s/p dilation, mild gastritis biopsied, large duodenal diverticulum involving ampulla, s/p duodenal biopsies.  Duodenal biopsies benign, gastric biopsies with chronic inflammation, intestinal metaplasia, no H. pylori.   SAVORY DILATION N/A 11/07/2016   Procedure: SAVORY DILATION;  Surgeon: West Bali, MD;  Location: [facility];  Service: Endoscopy;  Laterality: N/A;   TONSILLECTOMY     TUBAL LIGATION  2016   WISDOM TOOTH EXTRACTION      Family History  Problem Relation Age of Onset   Emphysema Maternal Grandmother    Gout Maternal Grandmother    Diabetes Maternal Grandmother    Gout Father    Alcohol abuse Father    Arthritis Father    COPD Father    Depression Father    Drug abuse Father    Hyperlipidemia  Father    Hypertension Father    Gout Mother    Arthritis Mother        RA   Diabetes Mother    Depression Mother    Heart disease Mother    Hyperlipidemia Mother    Hypertension Mother    Vision loss Mother    Kidney failure Mother    Thyroid disease Maternal Aunt    Diabetes Maternal Uncle    Breast cancer Paternal Aunt 61   Breast cancer Paternal Aunt 29   Breast cancer Other    Colon cancer Neg Hx    Inflammatory bowel disease Neg Hx    Celiac disease Neg Hx     Allergies as of 05/27/2023 - Review Complete 05/27/2023  Allergen Reaction Noted   Doxycycline Nausea And Vomiting 10/27/2011   Flagyl [metronidazole] Itching 01/17/2012   Latex Itching 10/27/2011   Penicillins Itching 10/27/2011    Social History   Socioeconomic History   Marital status: Single    Spouse name: n/a   Number of children: 1   Years of education: 13   Highest education level: Not on file  Occupational History   Not on file  Tobacco Use   Smoking status: Never   Smokeless tobacco: Never  Vaping Use   Vaping status: Never Used  Substance and Sexual Activity   Alcohol use: Not Currently    Comment: WINE OCCASIONAL   Drug use: Yes    Types: Marijuana    Comment: daily   Sexual activity: Yes    Birth control/protection: Surgical    Comment: tubal and ablation  Other Topics Concern   Not on file  Social History Narrative   Single parent   One daughter - almost 2   Social Determinants of Health   Financial Resource Strain: High Risk (10/18/2020)   Overall Financial Resource Strain (CARDIA)    Difficulty of Paying Living Expenses: Very hard  Food Insecurity: No Food Insecurity (10/18/2020)   Hunger Vital Sign    Worried About Running Out of Food in the Last Year: Never true    Ran Out of Food in the Last Year: Never true  Transportation Needs: No Transportation Needs (10/18/2020)   PRAPARE - Administrator, Civil Service (Medical): No    Lack of Transportation  (Non-Medical): No  Physical Activity: Inactive (10/18/2020)   Exercise Vital Sign    Days of Exercise per Week: 0 days    Minutes of Exercise per Session: 0 min  Stress: Stress Concern Present (10/18/2020)  [employer] of Occupational Health - Occupational Stress Questionnaire    Feeling of Stress : To some extent  Social Connections: Socially Isolated (10/18/2020)   Social Connection and Isolation Panel [NHANES]    Frequency of Communication with Friends and Family: More than three times a week    Frequency of Social Gatherings with Friends and Family: More than three times a week    Attends Religious Services: Never    Database administrator or Organizations: No    Attends Banker Meetings: Never    Marital Status: Never married   Review of Systems   Gen: + chills, weight loss, lack of appetite. Denies fever. Denies fatigue, weakness, weight loss.  CV: Denies chest pain, palpitations, syncope, peripheral edema, and claudication. Resp: Denies dyspnea at rest, cough, wheezing, coughing up blood, and pleurisy. GI: See HPI Derm: Denies rash, itching, dry skin Psych: Denies depression, anxiety, memory loss, confusion. No homicidal or suicidal ideation.  Heme: Denies bruising, bleeding, and enlarged lymph nodes.  Physical Exam   BP 127/85   Pulse 87   Temp 98.6 F (37 C)   Ht 5\' 6"  (1.676 m)   Wt 225 lb 6.4 oz (102.2 kg)   BMI 36.38 kg/m   General:   Alert and oriented. No distress noted. Pleasant and cooperative.  Head:  Normocephalic and atraumatic. Eyes:  Conjuctiva clear without scleral icterus. Mouth:  Oral mucosa pink and moist. Good dentition. No lesions. Lungs:  Clear to auscultation bilaterally. No wheezes, rales, or rhonchi. No distress.  Heart:  S1, S2 present without murmurs appreciated.  Abdomen:  +BS, soft, non-distended. Severe tenderness to palpation throughout abdomen mild guarding to upper abdomen. No rebound. No HSM or masses noted. Rectal:  deferred Msk:  Symmetrical without gross deformities. Normal posture. Extremities:  Without edema. Neurologic:  Alert and  oriented x4 Psych:  Alert and cooperative. Normal mood and affect.   Assessment  Brooke Eaton is a 47 y.o. female with a history of  IBS-D, GERD, epigastric pain, Schatzki's ring in 2018 s/p dilation, PTSD, substance abuse, migraines, anxiety/depression, and arthritis presenting today with complaint of vomiting x5 days.   Nausea and vomiting, lack of appetite, weight loss, abdominal pain, coffee ground emesis:  -Symptoms ongoing for the last 5 days, unable to keep down medications or any solids -Last full meal on Saturday, Last BM last night -Ongoing lack of appetite since COVID in 2020, worsening with recent intractable vomiting -Has been unable to take Nexium since Friday, cannot keep it down -Has previously admitted to frequent, sometimes daily marijuana use  - Suspect dehydration therefore will check labs including hemoglobin given coffee-ground emesis, suspect Mallory-Weiss tear in the setting of vomiting -Weight loss likely secondary to decreased p.o. intake and lack of appetite -Significant abdominal pain/tenderness on exam throughout, no recent abdominal imaging -Differential is broad including gastritis, duodenitis, peptic ulcer disease, cholecystitis/choledocholithiasis, cannabis hyperemesis, small bowel obstruction (less likely).  -Discussed ED precautions and if unable to tolerate diet rest of the day today despite change in administration of Zofran advised her to proceed for further evaluation. -Attempt to schedule EGD ASAP for evaluation of hematemesis, will provide Carafate in the interim -Zofran refilled.  GERD: Usually fairly well-controlled with Nexium 40 mg twice daily.  Currently with intractable vomiting of unclear etiology.  Providing Carafate as noted above given coffee-ground emesis.  Nexium refilled today.  PLAN   CBC, CMP, TSH CT A/P  STAT Zofran 4 mg ODT 2-3 times daily as  needed. Refilled.  Carafate 1g QID Continue Nexium 40 mg twice daily. Refilled.  Clear liquid diet GERD diet Proceed with upper endoscopy with propofol by Dr. Marletta Lor in near future: the risks, benefits, and alternatives have been discussed with the patient in detail. The patient states understanding and desires to proceed. ASA 2 (ASAP) UPT Marijuana cessation ED precautions discussed. Proceed if unable to tolerate p.o. or significant electrolyte derangements or recurrent coffee ground emesis.  Follow up TBD after review of labs and imaging.   Brooke Bonito, MSN, FNP-BC, AGACNP-BC [facility]

## 2023-05-27 NOTE — Progress Notes (Signed)
GI Office Note    Referring Provider: Fleet Contras, MD Primary Care Physician:  Fleet Contras, MD Primary Gastroenterologist: Hennie Duos. Eaton Lor, DO  Date:  05/27/2023  ID:  Brooke Eaton, DOB 14-Oct-1975, MRN [id number]   Chief Complaint   Chief Complaint  Patient presents with   Emesis    Pt has been vomiting since Thursday. Pt has chills. Smelling stuff makes her have nausea. I asked her did she get tested for the flu she stated no   History of Present Illness  Brooke Eaton is a 47 y.o. female with a history of IBS/D, GERD, epigastric pain, Schatzki's ring in 2018 s/p dilation, PTSD, substance abuse, migraines, anxiety/depression, and arthritis presenting today with complaint of vomiting x5 days.   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 Pylori  CT A/P July 2020: -No findings to explain epigastric pain, nausea, or vomiting  Previous GERD treatment: Various trials of PPIs including Prevacid (diarrhea), Nexium (helpful), omeprazole (failed)  Office visit 07/26/2021.  GERD reportedly well-controlled with Nexium 40 mg once daily.  Denied any abdominal pain, nausea, or vomiting.  Reported recent issues of dysphagia over the last year having to move her head a certain way to swallow.  Occurs with solids and liquids, sometimes even saliva.  Denied any constipation, bowels moving well.  No weight loss, melena, or BRBPR.  MBSS with BPE ordered.  Advised continue Nexium 40 mg once daily, reinforced GERD diet.  MBSS 08/16/2021: Normal oropharyngeal swallow in setting of upper and lower partials resulting in piecemeal deglutition of solids.  Exhibits  an effortful swallow, swallows however function and physiology appear within normal limits.  Esophageal sweep revealed brief stasis of barium tablet in the distal esophagus but cleared with second sip of liquid.  This is when patient reported globus sensation in her pharynx.  Suspected that referred esophageal symptoms or etiology of patient feeling she needs to move her head in a certain way to swallow pop only secondary to upper partial or treatment to anatomical or neurological changes that are beyond the scope of assessment.  ENT consult recommended if symptoms persist.  Regular textures and thin liquids with reflux precautions recommended.   BPE 08/16/2021:  -No evidence of esophageal mass, stricture, or filling defect.   -Mild diffuse impairment of esophageal motility with incomplete clearance of barium by primary peristaltic waves.   -No evidence of hiatal hernia.  No reflux witnessed during exam. -ENT consult and additional EGD for empiric dilation of possible esophageal web offered however patient would like to see if symptoms worsened prior to proceeding.  Last office visit 07/25/2022.  Occasional issues with nausea, weeks, nausea at times and was still throughout her medications.  Happens a few times a week and will also have dry heaves.  Unsure if anxiety.  Reports reflux controlled with Nexium twice daily.  Swallowing better than previously.  Denied any new medications.  Denied constipation, having 2-3 soft bowel movements daily without straining.  Not having good appetite the past few months.  Prednisone gives her [facility] but otherwise no appetite.  Trying to work toward losing weight.  Advised Nexium 40 mg twice daily, famotidine 20 mg as needed for breakthrough.  Provided Zofran 4 mg ODT twice daily as needed.  Advise EGD  for further evaluation if no improvement in symptoms.  Today:  Reports dark emesis. Threw up everything she has tried, liquids including pepto bismol she would throw up. She  has tried to lay down in order to try to rest. She reports her emesis has been very dark. Vomited this morning. Also has a dry heave. Has  sudden nauseas feeling like she wants to throw up. Last meal was early saturday and unable to keep anything down since then. Has not been able to take her nexium because it is a capsule. Has some abdominal tenderness. Can feel coolness going down. No dysphagia. No appetite. Has not had a good appetite since she had COVID in 2020. Has had a lot of weight loss.  Very tender to touch in epigastric region she reports.   Denis chest pain or shortness of breath. No syncope. Denies melena or brbpr.   She reports she just had a sinus infection. She reports this change in her stomach came after she started the antibiotics. Reports some chills but no fevers.   Reports feeling emotional due to inability to take her anxiety medications.   Wt Readings from Last 3 Encounters:  05/27/23 225 lb 6.4 oz (102.2 kg)  07/25/22 237 lb 3.2 oz (107.6 kg)  06/14/22 239 lb (108.4 kg)    Current Outpatient Medications  Medication Sig Dispense Refill   albuterol (ACCUNEB) 0.63 MG/3ML nebulizer solution Take 1 ampule by nebulization as needed for wheezing.     ALBUTEROL IN Inhale into the lungs as needed.     esomeprazole (NEXIUM) 40 MG capsule Take 1 capsule (40 mg total) by mouth 2 (two) times daily before a meal. 180 capsule 1   ondansetron (ZOFRAN-ODT) 4 MG disintegrating tablet DISSOLVE ONE TABLET BY MOUTH TWICE DAILY 30 tablet 1   pregabalin (LYRICA) 75 MG capsule Take 150 mg by mouth 3 (three) times daily.      solifenacin (VESICARE) 5 MG tablet Take 5 mg by mouth daily.     venlafaxine XR (EFFEXOR-XR) 150 MG 24 hr capsule Take 150 mg by mouth daily with breakfast. As needed     meloxicam (MOBIC) 7.5 MG tablet Take 1 tablet (7.5 mg total) by mouth daily. (Patient not taking: Reported on 05/27/2023) 30 tablet 0   No current facility-administered medications for this visit.     Past Medical History:  Diagnosis Date   Acid reflux    Allergy    Anxiety    Anxiety and depression    Arthritis    Asthma    Depression    Fibroid    Fibromyalgia    Fibromyalgia    Heart murmur    Irritable bowel syndrome    Migraines    Osteitis pubis (HCC)    Plantar fasciitis    PONV (postoperative nausea and vomiting)    Post-traumatic stress disorder    PTSD (post-traumatic stress disorder)    Ruptured cyst of ovary 2001   Ruptured cyst of ovary    Substance abuse (HCC)     Past Surgical History:  Procedure Laterality Date   ADENOIDECTOMY     COLONOSCOPY N/A 11/07/2016   Surgeon: West Bali, MD; edundant colon, pancolonic diverticulosis, internal hemorrhoids, negative random colon biopsies, recommended repeat in 10 years.   DILATION AND CURETTAGE OF UTERUS     DILITATION & CURRETTAGE/HYSTROSCOPY WITH NOVASURE ABLATION N/A 10/01/2016   Procedure: DILATATION & CURETTAGE/HYSTEROSCOPY WITH NOVASURE ABLATION AND EXCISION OF VULVAR/GLUTEAL  CYST;  Surgeon: Tilda Burrow, MD;  Location: [facility];  Service: Gynecology;  Laterality: N/A;   ESOPHAGOGASTRODUODENOSCOPY N/A 11/07/2016   Surgeon: West Bali, MD;  nonobstructing Schatzki's ring s/p dilation, mild gastritis biopsied, large duodenal diverticulum involving ampulla, s/p duodenal biopsies.  Duodenal biopsies benign, gastric biopsies with chronic inflammation, intestinal metaplasia, no H. pylori.   SAVORY DILATION N/A 11/07/2016   Procedure: SAVORY DILATION;  Surgeon: West Bali, MD;  Location: [facility];  Service: Endoscopy;  Laterality: N/A;   TONSILLECTOMY     TUBAL LIGATION  2016   WISDOM TOOTH EXTRACTION      Family History  Problem Relation Age of Onset   Emphysema Maternal Grandmother    Gout Maternal Grandmother    Diabetes Maternal Grandmother    Gout Father    Alcohol abuse Father    Arthritis Father    COPD Father    Depression Father    Drug abuse Father    Hyperlipidemia  Father    Hypertension Father    Gout Mother    Arthritis Mother        RA   Diabetes Mother    Depression Mother    Heart disease Mother    Hyperlipidemia Mother    Hypertension Mother    Vision loss Mother    Kidney failure Mother    Thyroid disease Maternal Aunt    Diabetes Maternal Uncle    Breast cancer Paternal Aunt 61   Breast cancer Paternal Aunt 29   Breast cancer Other    Colon cancer Neg Hx    Inflammatory bowel disease Neg Hx    Celiac disease Neg Hx     Allergies as of 05/27/2023 - Review Complete 05/27/2023  Allergen Reaction Noted   Doxycycline Nausea And Vomiting 10/27/2011   Flagyl [metronidazole] Itching 01/17/2012   Latex Itching 10/27/2011   Penicillins Itching 10/27/2011    Social History   Socioeconomic History   Marital status: Single    Spouse name: n/a   Number of children: 1   Years of education: 13   Highest education level: Not on file  Occupational History   Not on file  Tobacco Use   Smoking status: Never   Smokeless tobacco: Never  Vaping Use   Vaping status: Never Used  Substance and Sexual Activity   Alcohol use: Not Currently    Comment: WINE OCCASIONAL   Drug use: Yes    Types: Marijuana    Comment: daily   Sexual activity: Yes    Birth control/protection: Surgical    Comment: tubal and ablation  Other Topics Concern   Not on file  Social History Narrative   Single parent   One daughter - almost 2   Social Determinants of Health   Financial Resource Strain: High Risk (10/18/2020)   Overall Financial Resource Strain (CARDIA)    Difficulty of Paying Living Expenses: Very hard  Food Insecurity: No Food Insecurity (10/18/2020)   Hunger Vital Sign    Worried About Running Out of Food in the Last Year: Never true    Ran Out of Food in the Last Year: Never true  Transportation Needs: No Transportation Needs (10/18/2020)   PRAPARE - Administrator, Civil Service (Medical): No    Lack of Transportation  (Non-Medical): No  Physical Activity: Inactive (10/18/2020)   Exercise Vital Sign    Days of Exercise per Week: 0 days    Minutes of Exercise per Session: 0 min  Stress: Stress Concern Present (10/18/2020)  [employer] of Occupational Health - Occupational Stress Questionnaire    Feeling of Stress : To some extent  Social Connections: Socially Isolated (10/18/2020)   Social Connection and Isolation Panel [NHANES]    Frequency of Communication with Friends and Family: More than three times a week    Frequency of Social Gatherings with Friends and Family: More than three times a week    Attends Religious Services: Never    Database administrator or Organizations: No    Attends Banker Meetings: Never    Marital Status: Never married   Review of Systems   Gen: + chills, weight loss, lack of appetite. Denies fever. Denies fatigue, weakness, weight loss.  CV: Denies chest pain, palpitations, syncope, peripheral edema, and claudication. Resp: Denies dyspnea at rest, cough, wheezing, coughing up blood, and pleurisy. GI: See HPI Derm: Denies rash, itching, dry skin Psych: Denies depression, anxiety, memory loss, confusion. No homicidal or suicidal ideation.  Heme: Denies bruising, bleeding, and enlarged lymph nodes.  Physical Exam   BP 127/85   Pulse 87   Temp 98.6 F (37 C)   Ht 5\' 6"  (1.676 m)   Wt 225 lb 6.4 oz (102.2 kg)   BMI 36.38 kg/m   General:   Alert and oriented. No distress noted. Pleasant and cooperative.  Head:  Normocephalic and atraumatic. Eyes:  Conjuctiva clear without scleral icterus. Mouth:  Oral mucosa pink and moist. Good dentition. No lesions. Lungs:  Clear to auscultation bilaterally. No wheezes, rales, or rhonchi. No distress.  Heart:  S1, S2 present without murmurs appreciated.  Abdomen:  +BS, soft, non-distended. Severe tenderness to palpation throughout abdomen mild guarding to upper abdomen. No rebound. No HSM or masses noted. Rectal:  deferred Msk:  Symmetrical without gross deformities. Normal posture. Extremities:  Without edema. Neurologic:  Alert and  oriented x4 Psych:  Alert and cooperative. Normal mood and affect.   Assessment  Brooke Eaton is a 47 y.o. female with a history of  IBS-D, GERD, epigastric pain, Schatzki's ring in 2018 s/p dilation, PTSD, substance abuse, migraines, anxiety/depression, and arthritis presenting today with complaint of vomiting x5 days.   Nausea and vomiting, lack of appetite, weight loss, abdominal pain, coffee ground emesis:  -Symptoms ongoing for the last 5 days, unable to keep down medications or any solids -Last full meal on Saturday, Last BM last night -Ongoing lack of appetite since COVID in 2020, worsening with recent intractable vomiting -Has been unable to take Nexium since Friday, cannot keep it down -Has previously admitted to frequent, sometimes daily marijuana use  - Suspect dehydration therefore will check labs including hemoglobin given coffee-ground emesis, suspect Mallory-Weiss tear in the setting of vomiting -Weight loss likely secondary to decreased p.o. intake and lack of appetite -Significant abdominal pain/tenderness on exam throughout, no recent abdominal imaging -Differential is broad including gastritis, duodenitis, peptic ulcer disease, cholecystitis/choledocholithiasis, cannabis hyperemesis, small bowel obstruction (less likely).  -Discussed ED precautions and if unable to tolerate diet rest of the day today despite change in administration of Zofran advised her to proceed for further evaluation. -Attempt to schedule EGD ASAP for evaluation of hematemesis, will provide Carafate in the interim -Zofran refilled.  GERD: Usually fairly well-controlled with Nexium 40 mg twice daily.  Currently with intractable vomiting of unclear etiology.  Providing Carafate as noted above given coffee-ground emesis.  Nexium refilled today.  PLAN   CBC, CMP, TSH CT A/P  STAT Zofran 4 mg ODT 2-3 times daily as  needed. Refilled.  Carafate 1g QID Continue Nexium 40 mg twice daily. Refilled.  Clear liquid diet GERD diet Proceed with upper endoscopy with propofol by Dr. Marletta Brooke in near future: the risks, benefits, and alternatives have been discussed with the patient in detail. The patient states understanding and desires to proceed. ASA 2 (ASAP) UPT Marijuana cessation ED precautions discussed. Proceed if unable to tolerate p.o. or significant electrolyte derangements or recurrent coffee ground emesis.  Follow up TBD after review of labs and imaging.   Brooke Bonito, MSN, FNP-BC, AGACNP-BC [facility]

## 2023-05-27 NOTE — Telephone Encounter (Signed)
[employer] PA for EGD: Notification or Prior Authorization is not required for the requested services You are not required to submit a notification/prior authorization based on the information provided. If you have general questions about the prior authorization requirements, visit [email or web address] > Clinician Resources > Advance and Admission Notification Requirements. The number above acknowledges your notification. Please write this reference number down for future reference. If you would like to request an organization determination, please call us at [phone]. Decision ID #: [id number]

## 2023-05-27 NOTE — Telephone Encounter (Signed)
[employer] PA: CPT Code 13086 Description: CT ABDOMEN & PELVIS W/ Case Number: [id number] Review Date: 05/27/2023 10:21:57 AM Expiration Date: N/A Status: This member's benefit plan did not require a prior authorization for this request.

## 2023-05-27 NOTE — Patient Instructions (Addendum)
Please have blood work completed at [facility].  We will call you with results once they have been received. Please allow 3-5 business days for review. 2 locations for [facility] in [place]:              1. [address], [place]              2. [address] [place], [place]   You can also go to the hospital lab to have these done.  We will try to arrange stat CT at [facility]  I have sent in Carafate to the pharmacy for you.  Please let the tablet dissolve in 1-2 ounces of water and then drink as a slurry.  I suspect you may have a small tear in your esophagus related to frequent vomiting.  I have also refilled your Zofran as well as your Nexium.  Please remember for the Zofran to place this under your tongue and let it dissolve.  Follow a clear liquid diet, small sips.  If you are able to tolerate small sips after taking your Zofran then please try taking small amounts of your other medications including your Nexium.  If you are able to tolerate it you can open your Nexium capsule and swallow the contents.  If this evening you are not able to tolerate any liquids despite taking Zofran and you continue to have vomiting episodes of coffee-ground-like material then please go to the ED.   Will try to get you in for an upper endoscopy ASAP with Dr. Marletta Lor.  Pending CT results and lab results you may need to go to the ED for treatment and evaluation.  If you are continuing to smoke marijuana, you should completely stop doing this as this can cause cannabis hyperemesis syndrome which results in frequent vomiting episodes.   It was a pleasure to see you today. I want to create trusting relationships with patients. If you receive a survey regarding your visit,  I greatly appreciate you taking time to fill this out on paper or through your MyChart. I value your feedback.  Brooke Bonito, MSN, FNP-BC, AGACNP-BC [facility]

## 2023-05-28 LAB — COMPREHENSIVE METABOLIC PANEL
ALT: 14 [IU]/L (ref 0–32)
AST: 21 [IU]/L (ref 0–40)
Albumin: 4 g/dL (ref 3.9–4.9)
Alkaline Phosphatase: 78 [IU]/L (ref 44–121)
BUN/Creatinine Ratio: 15 (ref 9–23)
BUN: 12 mg/dL (ref 6–24)
Bilirubin Total: 0.3 mg/dL (ref 0.0–1.2)
CO2: 24 mmol/L (ref 20–29)
Calcium: 8.8 mg/dL (ref 8.7–10.2)
Chloride: 100 mmol/L (ref 96–106)
Creatinine, Ser: 0.81 mg/dL (ref 0.57–1.00)
Globulin, Total: 3.1 g/dL (ref 1.5–4.5)
Glucose: 87 mg/dL (ref 70–99)
Potassium: 4 mmol/L (ref 3.5–5.2)
Sodium: 140 mmol/L (ref 134–144)
Total Protein: 7.1 g/dL (ref 6.0–8.5)
eGFR: 91 mL/min/{1.73_m2} (ref >59)

## 2023-05-28 LAB — CBC
Hematocrit: 38.8 % (ref 34.0–46.6)
Hemoglobin: 12.7 g/dL (ref 11.1–15.9)
MCH: 29.2 pg (ref 26.6–33.0)
MCHC: 32.7 g/dL (ref 31.5–35.7)
MCV: 89 fL (ref 79–97)
Platelets: 379 10*3/uL (ref 150–450)
RBC: 4.35 x10E6/uL (ref 3.77–5.28)
RDW: 14.4 % (ref 11.7–15.4)
WBC: 7.8 10*3/uL (ref 3.4–10.8)

## 2023-05-28 LAB — TSH+FREE T4
Free T4: 1.31 ng/dL (ref 0.82–1.77)
TSH: 1.7 u[IU]/mL (ref 0.450–4.500)

## 2023-05-30 ENCOUNTER — Encounter: Payer: Self-pay | Admitting: *Deleted

## 2023-05-30 ENCOUNTER — Encounter (HOSPITAL_COMMUNITY)
Admission: RE | Admit: 2023-05-30 | Discharge: 2023-05-30 | Disposition: A | Discharge status: Home / Self Care | Payer: 59 | Source: Ambulatory Visit | Attending: Internal Medicine | Admitting: Internal Medicine

## 2023-05-30 NOTE — Pre-Procedure Instructions (Signed)
Attempted pre-op phonecall. Left VM for her to call us back. 

## 2023-05-31 LAB — PREGNANCY, URINE: Preg Test, Ur: NEGATIVE

## 2023-06-02 ENCOUNTER — Ambulatory Visit (HOSPITAL_BASED_OUTPATIENT_CLINIC_OR_DEPARTMENT_OTHER): Payer: 59 | Admitting: Certified Registered"

## 2023-06-02 ENCOUNTER — Ambulatory Visit (HOSPITAL_COMMUNITY)
Admission: RE | Admit: 2023-06-02 | Discharge: 2023-06-02 | Disposition: A | Discharge status: Home / Self Care | Payer: 59 | Attending: Internal Medicine | Admitting: Internal Medicine

## 2023-06-02 ENCOUNTER — Encounter (HOSPITAL_COMMUNITY): Payer: Self-pay

## 2023-06-02 ENCOUNTER — Ambulatory Visit (HOSPITAL_COMMUNITY): Payer: 59 | Admitting: Certified Registered"

## 2023-06-02 ENCOUNTER — Encounter (HOSPITAL_COMMUNITY)
Admission: RE | Disposition: A | Discharge status: Home / Self Care | Payer: Self-pay | Source: Home / Self Care | Attending: Internal Medicine

## 2023-06-02 DIAGNOSIS — K589 Irritable bowel syndrome without diarrhea: Secondary | ICD-10-CM | POA: Insufficient documentation

## 2023-06-02 DIAGNOSIS — K5710 Diverticulosis of small intestine without perforation or abscess without bleeding: Secondary | ICD-10-CM | POA: Insufficient documentation

## 2023-06-02 DIAGNOSIS — Z01818 Encounter for other preprocedural examination: Secondary | ICD-10-CM

## 2023-06-02 DIAGNOSIS — K222 Esophageal obstruction: Secondary | ICD-10-CM | POA: Diagnosis not present

## 2023-06-02 DIAGNOSIS — F419 Anxiety disorder, unspecified: Secondary | ICD-10-CM | POA: Diagnosis not present

## 2023-06-02 DIAGNOSIS — K219 Gastro-esophageal reflux disease without esophagitis: Secondary | ICD-10-CM | POA: Diagnosis not present

## 2023-06-02 DIAGNOSIS — R1013 Epigastric pain: Secondary | ICD-10-CM | POA: Diagnosis present

## 2023-06-02 DIAGNOSIS — Z8719 Personal history of other diseases of the digestive system: Secondary | ICD-10-CM | POA: Diagnosis not present

## 2023-06-02 DIAGNOSIS — Z8616 Personal history of COVID-19: Secondary | ICD-10-CM | POA: Diagnosis not present

## 2023-06-02 DIAGNOSIS — K2970 Gastritis, unspecified, without bleeding: Secondary | ICD-10-CM | POA: Diagnosis not present

## 2023-06-02 DIAGNOSIS — Z9089 Acquired absence of other organs: Secondary | ICD-10-CM | POA: Diagnosis not present

## 2023-06-02 DIAGNOSIS — Z5986 Financial insecurity: Secondary | ICD-10-CM | POA: Insufficient documentation

## 2023-06-02 DIAGNOSIS — F413 Other mixed anxiety disorders: Secondary | ICD-10-CM

## 2023-06-02 DIAGNOSIS — J45909 Unspecified asthma, uncomplicated: Secondary | ICD-10-CM | POA: Diagnosis not present

## 2023-06-02 HISTORY — PX: ESOPHAGOGASTRODUODENOSCOPY (EGD) WITH PROPOFOL: SHX5813

## 2023-06-02 HISTORY — PX: BIOPSY: SHX5522

## 2023-06-02 LAB — POCT PREGNANCY, URINE: Preg Test, Ur: NEGATIVE

## 2023-06-02 SURGERY — ESOPHAGOGASTRODUODENOSCOPY (EGD) WITH PROPOFOL
Anesthesia: General

## 2023-06-02 MED ORDER — PROPOFOL 10 MG/ML IV BOLUS
INTRAVENOUS | Status: DC | PRN
  Administered 2023-06-02: 140 mg via INTRAVENOUS
  Administered 2023-06-02: 60 mg via INTRAVENOUS
  Administered 2023-06-02: 50 mg via INTRAVENOUS

## 2023-06-02 MED ORDER — LACTATED RINGERS IV SOLN: INTRAVENOUS | Status: DC | PRN

## 2023-06-02 MED ORDER — LIDOCAINE HCL (CARDIAC) PF 100 MG/5ML IV SOSY
PREFILLED_SYRINGE | INTRAVENOUS | Status: DC | PRN
  Administered 2023-06-02: 50 mg via INTRAVENOUS

## 2023-06-02 NOTE — Interval H&P Note (Signed)
History and Physical Interval Note:  06/02/2023 11:50 AM  Brooke Eaton  has presented today for surgery, with the diagnosis of N/V,coffee ground emesis,GERD.  The various methods of treatment have been discussed with the patient and family. After consideration of risks, benefits and other options for treatment, the patient has consented to  Procedure(s) with comments: ESOPHAGOGASTRODUODENOSCOPY (EGD) WITH PROPOFOL (N/A) - 11:30 am, asa 2 as a surgical intervention.  The patient's history has been reviewed, patient examined, no change in status, stable for surgery.  I have reviewed the patient's chart and labs.  Questions were answered to the patient's satisfaction.     Lanelle Bal

## 2023-06-02 NOTE — Transfer of Care (Signed)
Immediate Anesthesia Transfer of Care Note  Patient: Brooke Eaton  Procedure(s) Performed: ESOPHAGOGASTRODUODENOSCOPY (EGD) WITH PROPOFOL BIOPSY  Patient Location: Short Stay  Anesthesia Type:General  Level of Consciousness: drowsy  Airway & Oxygen Therapy: Patient Spontanous Breathing  Post-op Assessment: Report given to RN and Post -op Vital signs reviewed and stable  Post vital signs: Reviewed and stable  Last Vitals:  Vitals Value Taken Time  BP    Temp    Pulse    Resp    SpO2      Last Pain:  Vitals:   06/02/23 1235  TempSrc:   PainSc: 0-No pain         Complications: No notable events documented.

## 2023-06-02 NOTE — Anesthesia Postprocedure Evaluation (Signed)
Anesthesia Post Note  Patient: Brooke Eaton  Procedure(s) Performed: ESOPHAGOGASTRODUODENOSCOPY (EGD) WITH PROPOFOL BIOPSY  Patient location during evaluation: Phase II Anesthesia Type: General Level of consciousness: awake Pain management: pain level controlled Vital Signs Assessment: post-procedure vital signs reviewed and stable Respiratory status: spontaneous breathing and respiratory function stable Cardiovascular status: blood pressure returned to baseline and stable Postop Assessment: no headache and no apparent nausea or vomiting Anesthetic complications: no Comments: Late entry   No notable events documented.   Last Vitals:  Vitals:   06/02/23 1247 06/02/23 1253  BP: 94/61 115/84  Pulse: 69 80  Resp: 16 15  Temp: 36.9 C   SpO2: 97% 100%    Last Pain:  Vitals:   06/02/23 1253  TempSrc:   PainSc: 0-No pain                 Windell Norfolk

## 2023-06-02 NOTE — Anesthesia Preprocedure Evaluation (Signed)
Anesthesia Evaluation  Patient identified by MRN, date of birth, ID band Patient awake    Reviewed: Allergy & Precautions, H&P , NPO status , Patient's Chart, lab work & pertinent test results, reviewed documented beta blocker date and time   History of Anesthesia Complications (+) PONV and history of anesthetic complications  Airway Mallampati: II  TM Distance: >3 FB Neck ROM: full    Dental no notable dental hx.    Pulmonary neg pulmonary ROS, asthma    Pulmonary exam normal breath sounds clear to auscultation       Cardiovascular Exercise Tolerance: Good negative cardio ROS + Valvular Problems/Murmurs  Rhythm:regular Rate:Normal     Neuro/Psych  Headaches PSYCHIATRIC DISORDERS Anxiety Depression     Neuromuscular disease negative neurological ROS  negative psych ROS   GI/Hepatic negative GI ROS, Neg liver ROS,GERD  ,,  Endo/Other  negative endocrine ROS    Renal/GU negative Renal ROS  negative genitourinary   Musculoskeletal   Abdominal   Peds  Hematology negative hematology ROS (+)   Anesthesia Other Findings   Reproductive/Obstetrics negative OB ROS                             Anesthesia Physical Anesthesia Plan  ASA: 3  Anesthesia Plan: General   Post-op Pain Management:    Induction:   PONV Risk Score and Plan: Propofol infusion  Airway Management Planned:   Additional Equipment:   Intra-op Plan:   Post-operative Plan:   Informed Consent: I have reviewed the patients History and Physical, chart, labs and discussed the procedure including the risks, benefits and alternatives for the proposed anesthesia with the patient or authorized representative who has indicated his/her understanding and acceptance.     Dental Advisory Given  Plan Discussed with: CRNA  Anesthesia Plan Comments:        Anesthesia Quick Evaluation

## 2023-06-02 NOTE — Anesthesia Procedure Notes (Signed)
Date/Time: 06/02/2023 12:35 PM  Performed by: Julian Reil, CRNAPre-anesthesia Checklist: Patient identified, Emergency Drugs available, Suction available and Patient being monitored Patient Re-evaluated:Patient Re-evaluated prior to induction Oxygen Delivery Method: Nasal cannula Induction Type: IV induction Placement Confirmation: positive ETCO2

## 2023-06-02 NOTE — Op Note (Signed)
[facility] Patient Name: Brooke Eaton Procedure Date: 06/02/2023 11:46 AM MRN: [id number] Date of Birth: 08-09-1975 Attending MD: Hennie Duos. Marletta Lor , [place], [id number] CSN: [id number] Age: 47 Admit Type: Outpatient Procedure:                Upper GI endoscopy Indications:              Epigastric abdominal pain, poor appetite, weight                            loss, nausea and vomiting Providers:                Hennie Duos. Marletta Lor, DO, Crystal Page, Francoise Ceo                            RN, RN, Barkley Bruns L. Jessee Avers, Technician Referring MD:              Medicines:                See the Anesthesia note for documentation of the                            administered medications Complications:            No immediate complications. Estimated Blood Loss:     Estimated blood loss was minimal. Procedure:                Pre-Anesthesia Assessment:                           - The anesthesia plan was to use monitored                            anesthesia care (MAC).                           After obtaining informed consent, the endoscope was                            passed under direct vision. Throughout the                            procedure, the patient's blood pressure, pulse, and                            oxygen saturations were monitored continuously. The                            GIF-H190 ([id number]) scope was introduced through the                            mouth, and advanced to the second part of duodenum.                            The upper GI endoscopy was accomplished without  difficulty. The patient tolerated the procedure                            well. Scope In: 12:39:45 PM Scope Out: 12:44:00 PM Total Procedure Duration: 0 hours 4 minutes 15 seconds  Findings:      The Z-line was regular.      Patchy mild inflammation characterized by erosions and erythema was       found in the gastric body and in the gastric antrum. Biopsies were  taken       with a cold forceps for Helicobacter pylori testing.      A medium non-bleeding diverticulum was found in the second portion of       the duodenum.      The duodenal bulb, first portion of the duodenum and second portion of       the duodenum were normal. Biopsies for histology were taken with a cold       forceps for evaluation of celiac disease. Impression:               - Z-line regular.                           - Gastritis. Biopsied.                           - Non-bleeding duodenal diverticulum.                           - Normal duodenal bulb, first portion of the                            duodenum and second portion of the duodenum.                            Biopsied. Moderate Sedation:      Per Anesthesia Care Recommendation:           - Patient has a contact number available for                            emergencies. The signs and symptoms of potential                            delayed complications were discussed with the                            patient. Return to normal activities tomorrow.                            Written discharge instructions were provided to the                            patient.                           - Resume previous diet.                           -  Continue present medications.                           - Await pathology results.                           - Use a proton pump inhibitor PO BID.                           - Return to GI clinic in 6 weeks.                           - Limit marijuana use Procedure Code(s):        --- Professional ---                           [phone], Esophagogastroduodenoscopy, flexible,                            transoral; with biopsy, single or multiple Diagnosis Code(s):        --- Professional ---                           K29.70, Gastritis, unspecified, without bleeding                           R10.13, Epigastric pain                           K57.10, Diverticulosis of small intestine  without                            perforation or abscess without bleeding CPT copyright 2022 American Medical Association. All rights reserved. The codes documented in this report are preliminary and upon coder review may  be revised to meet current compliance requirements. Hennie Duos. Marletta Lor, DO Hennie Duos. Marletta Lor, DO 06/02/2023 12:47:51 PM This report has been signed electronically. Number of Addenda: 0

## 2023-06-02 NOTE — Discharge Instructions (Signed)
EGD Discharge instructions Please read the instructions outlined below and refer to this sheet in the next few weeks. These discharge instructions provide you with general information on caring for yourself after you leave the hospital. Your doctor may also give you specific instructions. While your treatment has been planned according to the most current medical practices available, unavoidable complications occasionally occur. If you have any problems or questions after discharge, please call your doctor. ACTIVITY You may resume your regular activity but move at a slower pace for the next 24 hours.  Take frequent rest periods for the next 24 hours.  Walking will help expel (get rid of) the air and reduce the bloated feeling in your abdomen.  No driving for 24 hours (because of the anesthesia (medicine) used during the test).  You may shower.  Do not sign any important legal documents or operate any machinery for 24 hours (because of the anesthesia used during the test).  NUTRITION Drink plenty of fluids.  You may resume your normal diet.  Begin with a light meal and progress to your normal diet.  Avoid alcoholic beverages for 24 hours or as instructed by your caregiver.  MEDICATIONS You may resume your normal medications unless your caregiver tells you otherwise.  WHAT YOU CAN EXPECT TODAY You may experience abdominal discomfort such as a feeling of fullness or "gas" pains.  FOLLOW-UP Your doctor will discuss the results of your test with you.  SEEK IMMEDIATE MEDICAL ATTENTION IF ANY OF THE FOLLOWING OCCUR: Excessive nausea (feeling sick to your stomach) and/or vomiting.  Severe abdominal pain and distention (swelling).  Trouble swallowing.  Temperature over 101 F (37.8 C).  Rectal bleeding or vomiting of blood.   Your EGD revealed mild amount inflammation in your stomach.  I took biopsies of this to rule out infection with a bacteria called H. pylori.  I also took samples of your  small bowel as well.  Await pathology results, my office will contact you.  Esophagus appeared normal.  Continue on Nexium twice daily.  Continue Carafate.  Follow-up in GI office in 4 to 6 weeks.     I hope you have a great rest of your week!  Hennie Duos. Marletta Lor, D.O. Gastroenterology and Hepatology [facility]

## 2023-06-03 LAB — SURGICAL PATHOLOGY

## 2023-06-06 ENCOUNTER — Encounter (HOSPITAL_COMMUNITY): Payer: Self-pay | Admitting: Internal Medicine

## 2023-06-16 ENCOUNTER — Other Ambulatory Visit: Payer: Self-pay | Admitting: Internal Medicine

## 2023-06-16 DIAGNOSIS — Z1231 Encounter for screening mammogram for malignant neoplasm of breast: Secondary | ICD-10-CM

## 2023-06-18 ENCOUNTER — Encounter: Payer: Self-pay | Admitting: Podiatry

## 2023-06-18 ENCOUNTER — Ambulatory Visit (INDEPENDENT_AMBULATORY_CARE_PROVIDER_SITE_OTHER): Payer: 59 | Admitting: Podiatry

## 2023-06-18 VITALS — Ht 66.0 in | Wt 225.0 lb

## 2023-06-18 DIAGNOSIS — M722 Plantar fascial fibromatosis: Secondary | ICD-10-CM

## 2023-06-18 MED ORDER — TRIAMCINOLONE ACETONIDE 10 MG/ML IJ SUSP
10.0000 mg | Freq: Once | INTRAMUSCULAR | Status: AC
  Administered 2023-06-18: 10 mg via INTRA_ARTICULAR

## 2023-06-18 NOTE — Progress Notes (Signed)
Subjective:   Patient ID: Brooke Eaton, female   DOB: 47 y.o.   MRN: [id number]   HPI Patient presents with significant discomfort in the plantar heel both feet that has just occurred and had been present several years ago   ROS      Objective:  Physical Exam  Neurovascular status intact exquisite discomfort of the plantar fascia bilateral medial band     Assessment:  Acute plantar fasciitis bilateral     Plan:  Reviewed condition sterile prep injected the plantar fascia bilateral 3 mg Kenalog 5 mg Xylocaine applied sterile dressing reappoint to recheck as needed

## 2023-06-19 ENCOUNTER — Ambulatory Visit
Admission: RE | Admit: 2023-06-19 | Discharge: 2023-06-19 | Disposition: A | Discharge status: Home / Self Care | Payer: 59 | Source: Ambulatory Visit | Attending: Internal Medicine | Admitting: Internal Medicine

## 2023-06-19 DIAGNOSIS — Z1231 Encounter for screening mammogram for malignant neoplasm of breast: Secondary | ICD-10-CM

## 2023-07-01 ENCOUNTER — Ambulatory Visit (INDEPENDENT_AMBULATORY_CARE_PROVIDER_SITE_OTHER): Payer: 59 | Admitting: Obstetrics & Gynecology

## 2023-07-01 ENCOUNTER — Encounter: Payer: Self-pay | Admitting: Obstetrics & Gynecology

## 2023-07-01 ENCOUNTER — Other Ambulatory Visit (HOSPITAL_COMMUNITY)
Admission: RE | Admit: 2023-07-01 | Discharge: 2023-07-01 | Disposition: A | Discharge status: Home / Self Care | Payer: 59 | Source: Ambulatory Visit | Attending: Obstetrics & Gynecology | Admitting: Obstetrics & Gynecology

## 2023-07-01 VITALS — BP 124/81 | HR 82 | Ht 66.0 in | Wt 224.0 lb

## 2023-07-01 DIAGNOSIS — N838 Other noninflammatory disorders of ovary, fallopian tube and broad ligament: Secondary | ICD-10-CM | POA: Diagnosis not present

## 2023-07-01 DIAGNOSIS — E669 Obesity, unspecified: Secondary | ICD-10-CM | POA: Diagnosis not present

## 2023-07-01 DIAGNOSIS — N898 Other specified noninflammatory disorders of vagina: Secondary | ICD-10-CM | POA: Diagnosis present

## 2023-07-01 NOTE — Progress Notes (Signed)
GYN VISIT Patient name: Brooke Eaton MRN [id number]  Date of birth: 27-Jul-1976 Chief Complaint:   Ovarian Cyst  History of Present Illness:   Brooke Eaton is a 47 y.o. G47P1021 female being seen today for the following concerns:     Last month noted intermittent L-sided pain as well as nausea/vomiting.  While some of her symptoms are better, she still notes some intermittent pelvic pain.  Per pt, it seems to be related to hormonal changes- pregnancy, menses and even s/p ablation- seems like pain is "premenstrual" in nature.  Part of the work up included a CT, which showed a right adnexal mass.  She presents today for further evaluation.   Due to the ablation, she is no longer having any vaginal bleeding.  Additionally, notes "internal irritation"  just feels off.  Denies odor or itching.  No urinary concerns.    Pt has h/o LS- using clobetasol as needed- states she uses maybe about once a month  CT: Unremarkable uterus and left ovary.  5.1cm right adnexal cyst with an apparent 16mm mural nodule.  No LMP recorded. Patient has had an ablation.    Review of Systems:   Pertinent items are noted in HPI Denies fever/chills, dizziness, headaches, visual disturbances, fatigue, shortness of breath, chest pain. Pertinent History Reviewed:   Past Surgical History:  Procedure Laterality Date   ADENOIDECTOMY     BIOPSY  06/02/2023   Procedure: BIOPSY;  Surgeon: Lanelle Bal, DO;  Location: [facility];  Service: Endoscopy;;   COLONOSCOPY N/A 11/07/2016   Surgeon: West Bali, MD; edundant colon, pancolonic diverticulosis, internal hemorrhoids, negative random colon biopsies, recommended repeat in 10 years.   DILATION AND CURETTAGE OF UTERUS     DILITATION & CURRETTAGE/HYSTROSCOPY WITH NOVASURE ABLATION N/A 10/01/2016   Procedure: DILATATION & CURETTAGE/HYSTEROSCOPY WITH NOVASURE ABLATION AND EXCISION OF VULVAR/GLUTEAL  CYST;  Surgeon: Tilda Burrow, MD;  Location: [facility];   Service: Gynecology;  Laterality: N/A;   ESOPHAGOGASTRODUODENOSCOPY N/A 11/07/2016   Surgeon: West Bali, MD;  nonobstructing Schatzki's ring s/p dilation, mild gastritis biopsied, large duodenal diverticulum involving ampulla, s/p duodenal biopsies.  Duodenal biopsies benign, gastric biopsies with chronic inflammation, intestinal metaplasia, no H. pylori.   ESOPHAGOGASTRODUODENOSCOPY (EGD) WITH PROPOFOL N/A 06/02/2023   Procedure: ESOPHAGOGASTRODUODENOSCOPY (EGD) WITH PROPOFOL;  Surgeon: Lanelle Bal, DO;  Location: [facility];  Service: Endoscopy;  Laterality: N/A;  11:30 am, asa 2   SAVORY DILATION N/A 11/07/2016   Procedure: SAVORY DILATION;  Surgeon: West Bali, MD;  Location: [facility];  Service: Endoscopy;  Laterality: N/A;   TONSILLECTOMY     TUBAL LIGATION  2016   WISDOM TOOTH EXTRACTION      Past Medical History:  Diagnosis Date   Acid reflux    Allergy    Anxiety    Anxiety and depression    Arthritis    Asthma    Depression    Fibroid    Fibromyalgia    Fibromyalgia    Heart murmur    Irritable bowel syndrome    Migraines    Osteitis pubis (HCC)    Plantar fasciitis    PONV (postoperative nausea and vomiting)    Post-traumatic stress disorder    PTSD (post-traumatic stress disorder)    Ruptured cyst of ovary 2001   Ruptured cyst of ovary    Substance abuse (HCC)    Reviewed problem list, medications and allergies. Physical Assessment:   Vitals:  07/01/23 1450  BP: 124/81  Pulse: 82  Weight: 224 lb (101.6 kg)  Height: 5\' 6"  (1.676 m)  Body mass index is 36.15 kg/m.       Physical Examination:   General appearance: alert, well appearing, and in no distress  Psych: mood appropriate, normal affect  Skin: warm & dry   Cardiovascular: normal heart rate noted  Respiratory: normal respiratory effort, no distress  Abdomen: obese, soft, non-tender, no rebound, no guarding  Pelvic: VULVA: normal appearing vulva with no masses, tenderness or  lesions, VAGINA: normal appearing vagina with normal color and discharge, no lesions, CERVIX: normal appearing cervix without discharge or lesions, UTERUS: uterus is normal size, shape, consistency and nontender  Extremities: no edema   Chaperone:  Dr. Elberta Fortis     Assessment & Plan:  1) Right ovarian cyst -CT reviewed, plan for pelvic US for further evaluation and management -currently symptoms have somewhat improved  2) Vaginal irritation -plan to r/o infection, further management pending results  3) LS -continue with Clobetasol as needed   Orders Placed This Encounter  Procedures   US PELVIC COMPLETE WITH TRANSVAGINAL    Return for pelvic US at next available.   Myna Hidalgo, DO Attending Obstetrician & Gynecologist, [facility] for [employer], [facility]

## 2023-07-04 LAB — CERVICOVAGINAL ANCILLARY ONLY
Bacterial Vaginitis (gardnerella): NEGATIVE
Candida Glabrata: NEGATIVE
Candida Vaginitis: NEGATIVE
Comment: NEGATIVE
Comment: NEGATIVE
Comment: NEGATIVE

## 2023-07-11 NOTE — Progress Notes (Unsigned)
GI Office Note    Referring Provider: Fleet Contras, MD Primary Care Physician:  Fleet Contras, MD Primary Gastroenterologist: Hennie Duos. Marletta Lor, DO  Date:  07/14/2023  ID:  [name], DOB 1975/09/01, MRN [id number]   Chief Complaint   Chief Complaint  Patient presents with   Follow-up    Pt arrives for follow up. Pt would like rectal area checked, possible hemorrhoid.    History of Present Illness  Brooke Eaton is a 47 y.o. female with a history of IBS-D, GERD, epigastric pain, Schatzki's ring in 2018 s/p dilation, PTSD, substance abuse, migraines, anxiety/depression, and arthritis presenting today with complaint of possible hemorrhoid.   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 Pylori   CT A/P July 2020: -No findings to explain epigastric pain, nausea, or vomiting   Previous GERD treatment: Various trials of PPIs including Prevacid (diarrhea), Nexium (helpful), omeprazole (failed)   Office visit 07/26/2021.  GERD reportedly well-controlled with Nexium 40 mg once daily.  Denied any abdominal pain, nausea, or vomiting.  Reported recent issues of dysphagia over the last year having to move her head a certain way to swallow.  Occurs with solids and liquids, sometimes even saliva.  Denied any constipation, bowels moving well.  No weight loss, melena, or BRBPR.  MBSS with BPE ordered.  Advised continue Nexium 40 mg once daily, reinforced GERD diet.   MBSS 08/16/2021: Normal oropharyngeal swallow in setting of upper and lower partials resulting in piecemeal deglutition of solids.  Exhibits an effortful swallow, swallows however function and  physiology appear within normal limits.  Esophageal sweep revealed brief stasis of barium tablet in the distal esophagus but cleared with second sip of liquid.  This is when patient reported globus sensation in her pharynx.  Suspected that referred esophageal symptoms or etiology of patient feeling she needs to move her head in a certain way to swallow pop only secondary to upper partial or treatment to anatomical or neurological changes that are beyond the scope of assessment.  ENT consult recommended if symptoms persist.  Regular textures and thin liquids with reflux precautions recommended.   BPE 08/16/2021:  -No evidence of esophageal mass, stricture, or filling defect.   -Mild diffuse impairment of esophageal motility with incomplete clearance of barium by primary peristaltic waves.   -No evidence of hiatal hernia.  No reflux witnessed during exam. -ENT consult and additional EGD for empiric dilation of possible esophageal web offered however patient would like to see if symptoms worsened prior to proceeding.   Last office visit 07/25/2022.  Occasional issues with nausea, weeks, nausea at times and was still throughout her medications.  Happens a few times a week and will also have dry heaves.  Unsure if anxiety.  Reports reflux controlled with Nexium twice daily.  Swallowing better than previously.  Denied any new medications.  Denied constipation, having 2-3 soft bowel movements daily without straining.  Not having good appetite the past few months.  Prednisone gives her [facility] but otherwise no appetite.  Trying to work toward losing weight.  Advised Nexium 40 mg twice daily, famotidine 20 mg as needed for breakthrough.  Provided Zofran 4 mg ODT twice daily as needed.  Advise EGD for further evaluation if no improvement in symptoms.  Last office visit 05/27/23.  Reportedly unable to keep anything down.  Having a coolness sensation.  Has been unable to keep down any of her.  Ongoing leg appetite since  having COVID.  Admitted to frequent marijuana use reportedly having some dark emesis.  Also had been unable to take her Nexium she reported that her stomach since taking antibiotic for sinus infection.  Recommended labs including CBC, CMP, TSH. Stat CT A/P ordered.  Labs.  PPI twice daily.  Carafate 1 g 4 times daily.  Scheduled for EGD ASAP.  Advised clear liquid diet for at least 24 hours.  Discussed ED precautions.  CT A/P with contrast 05/27/2023: -No acute findings in the abdomen or pelvis -Duodenal diverticula evident -5.1 cm right adnexal cyst has an apparent 16 mm mural nodule along cranial wall (recommended GYN follow-up with pelvic ultrasound)  EGD 06/02/2023: -Gastritis s/p biopsy -Nonbleeding duodenal diverticulum -Normal duodenal bulb and first portion of duodenum s/p biopsy -Pathology negative for H. pylori, duodenal biopsy negative for celiac. -Advised PPI twice daily -Limit marijuana use  Pelvic ultrasound scheduled for 07/15/2023.  Today:  Can feel some tissue or a bulge in the rectal area with wiping. Has had hemorrhoids before (internal) and has had bleeding before. No issues with hemorrhoids during pregnancy. She feels like her bottom doesn't look right. No itching, burning.   Does have some constipation as well at baseline. Tries to stay with her vitamins and eating her veggies. Has bene given senna by PCP but she does not use it. Used a suppository once this summer. Has a BM everyday, sometimes. Does not have to strain. Sometimes goes twice daily. Coffee helps her go.   Not really having any nausea lately. Has some occasional symptoms. Weight has been stable. Appetite has bene coming back somewhat. Has been eating at least one good meal per day.   Wt Readings from Last 3 Encounters:  07/14/23 225 lb 12.8 oz (102.4 kg)  07/01/23 224 lb (101.6 kg)  06/18/23 225 lb (102.1 kg)  05/27/23 225 lb  Current Outpatient Medications  Medication Sig Dispense Refill    albuterol (ACCUNEB) 0.63 MG/3ML nebulizer solution Take 1 ampule by nebulization as needed for wheezing.     ALBUTEROL IN Inhale into the lungs as needed.     esomeprazole (NEXIUM) 40 MG capsule Take 1 capsule (40 mg total) by mouth 2 (two) times daily before a meal. 180 capsule 1   meloxicam (MOBIC) 7.5 MG tablet Take 1 tablet (7.5 mg total) by mouth daily. 30 tablet 0   ondansetron (ZOFRAN-ODT) 4 MG disintegrating tablet Take 1 tablet (4 mg total) by mouth every 8 (eight) hours as needed for nausea or vomiting. 30 tablet 1   pregabalin (LYRICA) 75 MG capsule Take 150 mg by mouth 3 (three) times daily.      solifenacin (VESICARE) 5 MG tablet Take 5 mg by mouth daily.     sucralfate (CARAFATE) 1 g tablet Take 1 tablet (1 g total) by mouth 4 (four) times daily. 28 tablet 1   venlafaxine XR (EFFEXOR-XR) 150 MG 24 hr capsule Take 150 mg by mouth daily with breakfast. As needed     No current facility-administered medications for this visit.    Past Medical History:  Diagnosis Date   Acid reflux    Allergy    Anxiety    Anxiety and depression    Arthritis    Asthma    Depression    Fibroid    Fibromyalgia  Fibromyalgia    Heart murmur    Irritable bowel syndrome    Migraines    Osteitis pubis (HCC)    Plantar fasciitis    PONV (postoperative nausea and vomiting)    Post-traumatic stress disorder    PTSD (post-traumatic stress disorder)    Ruptured cyst of ovary 2001   Ruptured cyst of ovary    Substance abuse [facility])     Past Surgical History:  Procedure Laterality Date   ADENOIDECTOMY     BIOPSY  06/02/2023   Procedure: BIOPSY;  Surgeon: Lanelle Bal, DO;  [facility];  Service: Endoscopy;;   COLONOSCOPY N/A 11/07/2016   Surgeon: West Bali, MD; edundant colon, pancolonic diverticulosis, internal hemorrhoids, negative random colon biopsies, recommended repeat in 10 years.   DILATION AND CURETTAGE OF UTERUS     DILITATION & CURRETTAGE/HYSTROSCOPY WITH  NOVASURE ABLATION N/A 10/01/2016   Procedure: DILATATION & CURETTAGE/HYSTEROSCOPY WITH NOVASURE ABLATION AND EXCISION OF VULVAR/GLUTEAL  CYST;  Surgeon: Tilda Burrow, MD;  Location: [facility];  Service: Gynecology;  Laterality: N/A;   ESOPHAGOGASTRODUODENOSCOPY N/A 11/07/2016   Surgeon: West Bali, MD;  nonobstructing Schatzki's ring s/p dilation, mild gastritis biopsied, large duodenal diverticulum involving ampulla, s/p duodenal biopsies.  Duodenal biopsies benign, gastric biopsies with chronic inflammation, intestinal metaplasia, no H. pylori.   ESOPHAGOGASTRODUODENOSCOPY (EGD) WITH PROPOFOL N/A 06/02/2023   Procedure: ESOPHAGOGASTRODUODENOSCOPY (EGD) WITH PROPOFOL;  Surgeon: Lanelle Bal, DO;  [facility];  Service: Endoscopy;  Laterality: N/A;  11:30 am, asa 2   SAVORY DILATION N/A 11/07/2016   Procedure: SAVORY DILATION;  Surgeon: West Bali, MD;  [facility];  Service: Endoscopy;  Laterality: N/A;   TONSILLECTOMY     TUBAL LIGATION  2016   WISDOM TOOTH EXTRACTION      Family History  Problem Relation Age of Onset   Emphysema Maternal Grandmother    Gout Maternal Grandmother    Diabetes Maternal Grandmother    Gout Father    Alcohol abuse Father    Arthritis Father    COPD Father    Depression Father    Drug abuse Father    Hyperlipidemia Father    Hypertension Father    Gout Mother    Arthritis Mother        RA   Diabetes Mother    Depression Mother    Heart disease Mother    Hyperlipidemia Mother    Hypertension Mother    Vision loss Mother    Kidney failure Mother    Thyroid disease Maternal Aunt    Diabetes Maternal Uncle    Breast cancer Paternal Aunt 28   Breast cancer Paternal Aunt 69   Breast cancer Other    Colon cancer Neg Hx    Inflammatory bowel disease Neg Hx    Celiac disease Neg Hx     Allergies as of 07/14/2023 - Review Complete 07/14/2023  Allergen Reaction Noted   Doxycycline Nausea And Vomiting 10/27/2011    Flagyl [metronidazole] Itching 01/17/2012   Latex Itching 10/27/2011   Penicillins Itching 10/27/2011    Social History   Socioeconomic History   Marital status: Single    Spouse name: n/a   Number of children: 1   Years of education: 13   Highest education level: Not on file  Occupational History   Not on file  Tobacco Use   Smoking status: Never   Smokeless tobacco: Never  Vaping Use   Vaping status: Never  Used  Substance and Sexual Activity   Alcohol use: Not Currently    Comment: WINE OCCASIONAL   Drug use: Yes    Types: Marijuana    Comment: daily   Sexual activity: Yes    Birth control/protection: Surgical    Comment: tubal and ablation  Other Topics Concern   Not on file  Social History Narrative   Single parent   One daughter - almost 2   Social Determinants of Health   Financial Resource Strain: High Risk (10/18/2020)   Overall Financial Resource Strain (CARDIA)    Difficulty of Paying Living Expenses: Very hard  Food Insecurity: No Food Insecurity (10/18/2020)   Hunger Vital Sign    Worried About Running Out of Food in the Last Year: Never true    Ran Out of Food in the Last Year: Never true  Transportation Needs: No Transportation Needs (10/18/2020)   PRAPARE - Administrator, Civil Service (Medical): No    Lack of Transportation (Non-Medical): No  Physical Activity: Inactive (10/18/2020)   Exercise Vital Sign    Days of Exercise per Week: 0 days    Minutes of Exercise per Session: 0 min  Stress: Stress Concern Present (10/18/2020)   [employer] of Occupational Health - Occupational Stress Questionnaire    Feeling of Stress : To some extent  Social Connections: Socially Isolated (10/18/2020)   Social Connection and Isolation Panel [NHANES]    Frequency of Communication with Friends and Family: More than three times a week    Frequency of Social Gatherings with Friends and Family: More than three times a week    Attends Religious  Services: Never    Database administrator or Organizations: No    Attends Banker Meetings: Never    Marital Status: Never married   Review of Systems   Gen: Denies fever, chills, anorexia. Denies fatigue, weakness, weight loss.  CV: Denies chest pain, palpitations, syncope, peripheral edema, and claudication. Resp: Denies dyspnea at rest, cough, wheezing, coughing up blood, and pleurisy. GI: See HPI Derm: Denies rash, itching, dry skin Psych: Denies depression, anxiety, memory loss, confusion. No homicidal or suicidal ideation.  Heme: Denies bruising, bleeding, and enlarged lymph nodes.  Physical Exam   BP 107/72   Pulse 75   Temp 98.1 F (36.7 C)   Ht 5\' 6"  (1.676 m)   Wt 225 lb 12.8 oz (102.4 kg)   BMI 36.45 kg/m   General:   Alert and oriented. No distress noted. Pleasant and cooperative.  Head:  Normocephalic and atraumatic. Eyes:  Conjuctiva clear without scleral icterus. Mouth:  Oral mucosa pink and moist. Good dentition. No lesions. Rectal: Internal prolapsing hemorrhoid noted about 10/11:00 in left lateral position. (12 oclock caudal reference).  Easily reducible.  No evidence of any other internal hemorrhoids on exam today.  No rectal mass.  Good rectal tone. Msk:  Symmetrical without gross deformities. Normal posture. Neurologic:  Alert and  oriented x4 Psych:  Alert and cooperative. Normal mood and affect.  Assessment  SHRIKA TANNA is a 47 y.o. female with a history of IBS-D, GERD, epigastric pain, Schatzki's ring in 2018 s/p dilation, PTSD, substance abuse, migraines, anxiety/depression, and arthritis.  Nausea/vomiting, lack of appetite, weight loss, abdominal pain, coffee-ground emesis: Etiology of prior symptoms unclear but could be secondary to viral illness as well as uncontrolled acid reflux symptoms.  Weight loss suspected to be secondary to decreased p.o. intake and lack of appetite which also could  be secondary to viral etiology or her baseline  lack of appetite since having COVID in 2020. EGD with gastritis, negative for H. pylori as well as nonbleeding duodenal diverticulum.  She was advised to continue PPI BID and limit marijuana use.  Currently has only been taking Nexium once daily but nausea has resolved.  Continues to have her chronic baseline lack of appetite however her weight has been stable since her last office visit.  Advised that she could continue Zofran as needed.  GERD: Reports reflux is well-controlled with Nexium 40 mg once daily.  No longer having any nausea or vomiting as stated above.  Constipation: Generally has a bowel movement every day and denies any straining.  Sometimes has a bowel movement twice daily but unsure if this is incomplete emptying.  Advised her to begin senna daily as recommended by PCP.  Advise she did stop if she began having diarrhea.  Grade 2 hemorrhoid: Sometimes worsened with bowel movements.  Tissue occasionally protrudes but sometimes with biking on itself.  Reduced on exam today.  Denies any bleeding, itching, burning, or rectal pain.  Discussed elective hemorrhoid banding today, provided pamphlet to her.  Advised her to schedule if she would like.  PLAN   Continue Nexium 40 mg once daily.  GERD diet Marijuana cessation Zofran as needed. Refilled today.  Start Senokot daily.  Hemorrhoid banding pamphlet provided. Follow up as needed  Brooke Bonito, MSN, FNP-BC, AGACNP-BC [facility]

## 2023-07-14 ENCOUNTER — Encounter: Payer: Self-pay | Admitting: Gastroenterology

## 2023-07-14 ENCOUNTER — Ambulatory Visit (INDEPENDENT_AMBULATORY_CARE_PROVIDER_SITE_OTHER): Payer: 59 | Admitting: Gastroenterology

## 2023-07-14 VITALS — BP 107/72 | HR 75 | Temp 98.1°F | Ht 66.0 in | Wt 225.8 lb

## 2023-07-14 DIAGNOSIS — K5710 Diverticulosis of small intestine without perforation or abscess without bleeding: Secondary | ICD-10-CM

## 2023-07-14 DIAGNOSIS — F1290 Cannabis use, unspecified, uncomplicated: Secondary | ICD-10-CM

## 2023-07-14 DIAGNOSIS — R630 Anorexia: Secondary | ICD-10-CM

## 2023-07-14 DIAGNOSIS — K5900 Constipation, unspecified: Secondary | ICD-10-CM

## 2023-07-14 DIAGNOSIS — R112 Nausea with vomiting, unspecified: Secondary | ICD-10-CM

## 2023-07-14 DIAGNOSIS — K920 Hematemesis: Secondary | ICD-10-CM | POA: Diagnosis not present

## 2023-07-14 DIAGNOSIS — K2970 Gastritis, unspecified, without bleeding: Secondary | ICD-10-CM | POA: Diagnosis not present

## 2023-07-14 DIAGNOSIS — K219 Gastro-esophageal reflux disease without esophagitis: Secondary | ICD-10-CM | POA: Diagnosis not present

## 2023-07-14 DIAGNOSIS — K641 Second degree hemorrhoids: Secondary | ICD-10-CM

## 2023-07-14 MED ORDER — ONDANSETRON 4 MG PO TBDP: 4.0000 mg | ORAL_TABLET | Freq: Three times a day (TID) | ORAL | 1 refills | Status: AC | PRN

## 2023-07-14 NOTE — Patient Instructions (Signed)
Continue Nexium 40 mg once daily.  If you begin to have worsening nausea, vomiting, reflux symptoms you can increase to twice daily.  Please let me know if you do.  Follow a GERD diet:  Avoid fried, fatty, greasy, spicy, citrus foods. Avoid caffeine and carbonated beverages. Avoid chocolate. Try eating 4-6 small meals a day rather than 3 large meals. Do not eat within 3 hours of laying down. Prop head of bed up on wood or bricks to create a 6 inch incline.  You may continue to use Zofran as needed.  I sent refill to the pharmacy for you today.  For constipation on due to start Senokot daily.  If you began having multiple loose bowel movements a day been stopped.  Please look over hemorrhoid banding pamphlet provided today.  If you decide that you would like to proceed with this procedure then you can call the office and make an appointment.  Follow-up as needed.  It was a pleasure to see you today. I want to create trusting relationships with patients. If you receive a survey regarding your visit,  I greatly appreciate you taking time to fill this out on paper or through your MyChart. I value your feedback.  Brooke Bonito, MSN, FNP-BC, AGACNP-BC [facility]

## 2023-07-15 ENCOUNTER — Encounter: Payer: Self-pay | Admitting: Obstetrics & Gynecology

## 2023-07-15 ENCOUNTER — Ambulatory Visit (INDEPENDENT_AMBULATORY_CARE_PROVIDER_SITE_OTHER): Payer: 59 | Admitting: Obstetrics & Gynecology

## 2023-07-15 ENCOUNTER — Ambulatory Visit (INDEPENDENT_AMBULATORY_CARE_PROVIDER_SITE_OTHER): Payer: 59

## 2023-07-15 VITALS — BP 132/81 | HR 84 | Ht 66.0 in | Wt 223.6 lb

## 2023-07-15 DIAGNOSIS — N838 Other noninflammatory disorders of ovary, fallopian tube and broad ligament: Secondary | ICD-10-CM | POA: Diagnosis not present

## 2023-07-15 DIAGNOSIS — N83201 Unspecified ovarian cyst, right side: Secondary | ICD-10-CM

## 2023-07-15 NOTE — Progress Notes (Signed)
PELVIC US TA/TV: heterogeneous anteverted uterus,fundal right subserosal fibroid 1.8 x 1.9 x 1.5 cm,multiple simple nabothian cysts,EEC 4.4 mm (limited view),normal right ovary,simple left ovarian cyst 2.5 x 1.9 x 2.1 cm,no free fluid   Chaperone Ferdinand Lango

## 2023-07-15 NOTE — Progress Notes (Signed)
GYN VISIT Patient name: Brooke Eaton MRN [id number]  Date of birth: 11-10-1975 Chief Complaint:   Follow-up  History of Present Illness:   Brooke Eaton is a 47 y.o. G28P1021 female being seen today for follow up regarding:  -Ovarian cyst: Pt was seen in ED due to pelvic pain.  CT completed that showed:  CT: Unremarkable uterus and left ovary.  5.1cm right adnexal cyst with an apparent 16mm mural nodule.   She presented today for pelvic US and management.  Denies pelvic or abdominal pain currently.  Denies vaginal discharge, itching or irritation.  She reports no acute GYN concerns at this time.     No LMP recorded. Patient has had an ablation.    Review of Systems:   Pertinent items are noted in HPI Denies fever/chills, dizziness, headaches, visual disturbances, fatigue, shortness of breath, chest pain, abdominal pain, vomiting, no problems with periods, bowel movements, urination, or intercourse unless otherwise stated above.  Pertinent History Reviewed:   Past Surgical History:  Procedure Laterality Date   ADENOIDECTOMY     BIOPSY  06/02/2023   Procedure: BIOPSY;  Surgeon: Lanelle Bal, DO;  [facility];  Service: Endoscopy;;   COLONOSCOPY N/A 11/07/2016   Surgeon: West Bali, MD; edundant colon, pancolonic diverticulosis, internal hemorrhoids, negative random colon biopsies, recommended repeat in 10 years.   DILATION AND CURETTAGE OF UTERUS     DILITATION & CURRETTAGE/HYSTROSCOPY WITH NOVASURE ABLATION N/A 10/01/2016   Procedure: DILATATION & CURETTAGE/HYSTEROSCOPY WITH NOVASURE ABLATION AND EXCISION OF VULVAR/GLUTEAL  CYST;  Surgeon: Tilda Burrow, MD;  Location: [facility];  Service: Gynecology;  Laterality: N/A;   ESOPHAGOGASTRODUODENOSCOPY N/A 11/07/2016   Surgeon: West Bali, MD;  nonobstructing Schatzki's ring s/p dilation, mild gastritis biopsied, large duodenal diverticulum involving ampulla, s/p duodenal biopsies.  Duodenal biopsies benign,  gastric biopsies with chronic inflammation, intestinal metaplasia, no H. pylori.   ESOPHAGOGASTRODUODENOSCOPY (EGD) WITH PROPOFOL N/A 06/02/2023   Procedure: ESOPHAGOGASTRODUODENOSCOPY (EGD) WITH PROPOFOL;  Surgeon: Lanelle Bal, DO;  [facility];  Service: Endoscopy;  Laterality: N/A;  11:30 am, asa 2   SAVORY DILATION N/A 11/07/2016   Procedure: SAVORY DILATION;  Surgeon: West Bali, MD;  [facility];  Service: Endoscopy;  Laterality: N/A;   TONSILLECTOMY     TUBAL LIGATION  2016   WISDOM TOOTH EXTRACTION      Past Medical History:  Diagnosis Date   Acid reflux    Allergy    Anxiety    Anxiety and depression    Arthritis    Asthma    Depression    Fibroid    Fibromyalgia    Fibromyalgia    Heart murmur    Irritable bowel syndrome    Migraines    Osteitis pubis (HCC)    Plantar fasciitis    PONV (postoperative nausea and vomiting)    Post-traumatic stress disorder    PTSD (post-traumatic stress disorder)    Ruptured cyst of ovary 2001   Ruptured cyst of ovary    Substance abuse (HCC)    Reviewed problem list, medications and allergies. Physical Assessment:   Vitals:   07/15/23 1405  BP: 132/81  Pulse: 84  Weight: 223 lb 9.6 oz (101.4 kg)  Height: 5\' 6"  (1.676 m)  Body mass index is 36.09 kg/m.       Physical Examination:   General appearance: alert, well appearing, and in no distress  Psych: mood appropriate, normal affect  Skin: warm & dry   Cardiovascular: normal heart rate noted  Respiratory: normal respiratory effort, no distress  Abdomen: soft, non-tender   Extremities: no edema   Chaperone: N/A    Pelvic US: heterogeneous anteverted uterus,fundal right subserosal fibroid 1.8 x 1.9 x 1.5 cm,multiple simple nabothian cysts,EEC 4.4 mm (limited view),normal right ovary,simple left ovarian cyst 2.5 x 1.9 x 2.1 cm,no free fluid   Assessment & Plan:  1) Ovarian cyst- now resolved -imaging reviewed today, no abnormalities  noted -left ovary with physiologic cyst, no follow up indicated -reassured pt of benign findings   No orders of the defined types were placed in this encounter.   Return for March/April 2025 annual.   Myna Hidalgo, DO Attending Obstetrician & Gynecologist, [facility] for [employer], [facility]

## 2023-07-26 ENCOUNTER — Ambulatory Visit
Admission: EM | Admit: 2023-07-26 | Discharge: 2023-07-26 | Disposition: A | Discharge status: Home / Self Care | Payer: 59 | Attending: Nurse Practitioner | Admitting: Nurse Practitioner

## 2023-07-26 ENCOUNTER — Encounter: Payer: Self-pay | Admitting: Emergency Medicine

## 2023-07-26 ENCOUNTER — Ambulatory Visit (INDEPENDENT_AMBULATORY_CARE_PROVIDER_SITE_OTHER): Payer: 59

## 2023-07-26 ENCOUNTER — Telehealth: Payer: Self-pay | Admitting: Nurse Practitioner

## 2023-07-26 DIAGNOSIS — G8929 Other chronic pain: Secondary | ICD-10-CM | POA: Diagnosis not present

## 2023-07-26 DIAGNOSIS — M25561 Pain in right knee: Secondary | ICD-10-CM

## 2023-07-26 MED ORDER — KETOROLAC TROMETHAMINE 30 MG/ML IJ SOLN
30.0000 mg | Freq: Once | INTRAMUSCULAR | Status: AC
  Administered 2023-07-26: 30 mg via INTRAMUSCULAR

## 2023-07-26 MED ORDER — DEXAMETHASONE SODIUM PHOSPHATE 10 MG/ML IJ SOLN
10.0000 mg | INTRAMUSCULAR | Status: AC
  Administered 2023-07-26: 10 mg via INTRAMUSCULAR

## 2023-07-26 NOTE — ED Triage Notes (Signed)
Right knee pain x 1 month.  No known injury.  Noticed pain after having injections in foot for plantar fascitis

## 2023-07-26 NOTE — Discharge Instructions (Addendum)
X-ray of the right knee is pending.  You will be contacted if the result of the x-ray is abnormal. You were given injections of Decadron 10 mg and Toradol 30 mg for pain.  Do not take any additional ibuprofen, Aleve, Motrin, or other NSAIDs today.  You may take Tylenol arthritis strength as needed for breakthrough pain. RICE therapy, rest, ice, compression, and elevation. Try to limit weightbearing is much as possible to help decrease pain. A referral has been placed for you for [facility].  Recommend giving them a call on 07/28/2023 to schedule an appointment. Follow-up as needed.

## 2023-07-26 NOTE — Telephone Encounter (Signed)
Call patient to discuss x-ray results.  Reach voicemail, left message asking patient to return the phone call.

## 2023-07-26 NOTE — ED Provider Notes (Signed)
[facility]    CSN: [id number] Arrival date & time: 07/26/23  0848      History   Chief Complaint No chief complaint on file.   HPI Brooke Eaton is a 47 y.o. female.   The history is provided by the patient.   Patient presents for complaints of chronic right knee pain/osteoarthritis.  Patient states over the past month, her knee pain has worsened.  She reports that she did get an injection in the knee prior to her symptoms starting, and since that time, her knee pain has worsened.  She states the pain is on the inside of the knee and radiates into her lower leg.  She states she has pain with most movement of the right lower extremity.  Patient reports that she has seen orthopedics in the past, but has not been back since she had the last injection.  She denies numbness, tingling, or inability to bear weight.  Reports that she did use a "concoction" of creams that she had at home to rub on her knee to help with pain.  Also reports that she has been prescribed several medications for her knee pain.  Past Medical History:  Diagnosis Date   Acid reflux    Allergy    Anxiety    Anxiety and depression    Arthritis    Asthma    Depression    Fibroid    Fibromyalgia    Fibromyalgia    Heart murmur    Irritable bowel syndrome    Migraines    Osteitis pubis (HCC)    Plantar fasciitis    PONV (postoperative nausea and vomiting)    Post-traumatic stress disorder    PTSD (post-traumatic stress disorder)    Ruptured cyst of ovary 2001   Ruptured cyst of ovary    Substance abuse [facility])     Patient Active Problem List   Diagnosis Date Noted   Insect bite of right wrist 05/29/2022   Boil 05/29/2022   Lichen sclerosus 05/01/2022   Vaginal irritation 05/01/2022   Oropharyngeal dysphagia 07/26/2021   Folliculitis of perineum 02/16/2020   Pelvic fluid collection 10/19/2019   Constipation 10/12/2019   Nausea with vomiting 11/18/2018   Epigastric pain 01/13/2018    Gastritis and gastroduodenitis 03/18/2017   Abdominal pain, epigastric 10/22/2016   Esophageal dysphagia 10/22/2016   Asthma 09/02/2016   Marijuana use, continuous 09/02/2016   Menorrhagia with regular cycle 08/16/2016   PMS (premenstrual syndrome) 01/17/2012   Anxiety and depression    Post-traumatic stress disorder    Irritable bowel syndrome    GERD (gastroesophageal reflux disease)     Past Surgical History:  Procedure Laterality Date   ADENOIDECTOMY     BIOPSY  06/02/2023   Procedure: BIOPSY;  Surgeon: Lanelle Bal, DO;  [facility];  Service: Endoscopy;;   COLONOSCOPY N/A 11/07/2016   Surgeon: West Bali, MD; edundant colon, pancolonic diverticulosis, internal hemorrhoids, negative random colon biopsies, recommended repeat in 10 years.   DILATION AND CURETTAGE OF UTERUS     DILITATION & CURRETTAGE/HYSTROSCOPY WITH NOVASURE ABLATION N/A 10/01/2016   Procedure: DILATATION & CURETTAGE/HYSTEROSCOPY WITH NOVASURE ABLATION AND EXCISION OF VULVAR/GLUTEAL  CYST;  Surgeon: Tilda Burrow, MD;  Location: [facility];  Service: Gynecology;  Laterality: N/A;   ESOPHAGOGASTRODUODENOSCOPY N/A 11/07/2016   Surgeon: West Bali, MD;  nonobstructing Schatzki's ring s/p dilation, mild gastritis biopsied, large duodenal diverticulum involving ampulla, s/p duodenal biopsies.  Duodenal biopsies benign, gastric biopsies  with chronic inflammation, intestinal metaplasia, no H. pylori.   ESOPHAGOGASTRODUODENOSCOPY (EGD) WITH PROPOFOL N/A 06/02/2023   Procedure: ESOPHAGOGASTRODUODENOSCOPY (EGD) WITH PROPOFOL;  Surgeon: Lanelle Bal, DO;  [facility]: [facility];  Service: Endoscopy;  Laterality: N/A;  11:30 am, asa 2   SAVORY DILATION N/A 11/07/2016   Procedure: SAVORY DILATION;  Surgeon: West Bali, MD;  Location: [facility];  Service: Endoscopy;  Laterality: N/A;   TONSILLECTOMY     TUBAL LIGATION  2016   WISDOM TOOTH EXTRACTION      OB History     Gravida  3    Para  1   Term  1   Preterm      AB  2   Living  1      SAB  2   IAB      Ectopic      Multiple      Live Births  1        Obstetric Comments  PT HAD ONE STILL BIRTH          Home Medications    Prior to Admission medications   Medication Sig Start Date End Date Taking? Authorizing Provider  albuterol (ACCUNEB) 0.63 MG/3ML nebulizer solution Take 1 ampule by nebulization as needed for wheezing.    [provider]  ALBUTEROL IN Inhale into the lungs as needed.    [provider]  esomeprazole (NEXIUM) 40 MG capsule Take 1 capsule (40 mg total) by mouth 2 (two) times daily before a meal. 05/27/23 11/23/23  Aida Raider, NP  meloxicam (MOBIC) 7.5 MG tablet Take 1 tablet (7.5 mg total) by mouth daily. 05/05/23   McCaughan, Dia D, DPM  ondansetron (ZOFRAN-ODT) 4 MG disintegrating tablet Take 1 tablet (4 mg total) by mouth every 8 (eight) hours as needed for nausea or vomiting. 07/14/23   Aida Raider, NP  pregabalin (LYRICA) 75 MG capsule Take 150 mg by mouth 3 (three) times daily.     [provider]  solifenacin (VESICARE) 5 MG tablet Take 5 mg by mouth daily. 05/01/23   [provider]  sucralfate (CARAFATE) 1 g tablet Take 1 tablet (1 g total) by mouth 4 (four) times daily. 05/27/23   Aida Raider, NP  venlafaxine XR (EFFEXOR-XR) 150 MG 24 hr capsule Take 150 mg by mouth daily with breakfast. As needed    [provider]    Family History Family History  Problem Relation Age of Onset   Emphysema Maternal Grandmother    Gout Maternal Grandmother    Diabetes Maternal Grandmother    Gout Father    Alcohol abuse Father    Arthritis Father    COPD Father    Depression Father    Drug abuse Father    Hyperlipidemia Father    Hypertension Father    Gout Mother    Arthritis Mother        RA   Diabetes Mother    Depression Mother    Heart disease Mother    Hyperlipidemia Mother    Hypertension Mother     Vision loss Mother    Kidney failure Mother    Thyroid disease Maternal Aunt    Diabetes Maternal Uncle    Breast cancer Paternal Aunt 34   Breast cancer Paternal Aunt 88   Breast cancer Other    Colon cancer Neg Hx    Inflammatory bowel disease Neg Hx    Celiac disease Neg Hx  Social History Social History   Tobacco Use   Smoking status: Never   Smokeless tobacco: Never  Vaping Use   Vaping status: Never Used  Substance Use Topics   Alcohol use: Not Currently    Comment: WINE OCCASIONAL   Drug use: Yes    Types: Marijuana    Comment: daily     Allergies   Doxycycline, Flagyl [metronidazole], Latex, and Penicillins   Review of Systems Review of Systems Per HPI  Physical Exam Triage Vital Signs ED Triage Vitals  Encounter Vitals Group     BP 07/26/23 0855 112/81     Systolic BP Percentile --      Diastolic BP Percentile --      Pulse Rate 07/26/23 0855 93     Resp 07/26/23 0855 18     Temp 07/26/23 0855 98.4 F (36.9 C)     Temp Source 07/26/23 0855 Oral     SpO2 07/26/23 0855 96 %     Weight --      Height --      Head Circumference --      Peak Flow --      Pain Score 07/26/23 0856 10     Pain Loc --      Pain Education --      Exclude from Growth Chart --    No data found.  Updated Vital Signs BP 112/81 (BP Location: Right Arm)   Pulse 93   Temp 98.4 F (36.9 C) (Oral)   Resp 18   LMP 07/07/2023 (Approximate)   SpO2 96%   Visual Acuity Right Eye Distance:   Left Eye Distance:   Bilateral Distance:    Right Eye Near:   Left Eye Near:    Bilateral Near:     Physical Exam Vitals and nursing note reviewed.  Constitutional:      General: She is not in acute distress.    Appearance: Normal appearance.  HENT:     Head: Normocephalic.  Eyes:     Extraocular Movements: Extraocular movements intact.     Pupils: Pupils are equal, round, and reactive to light.  Pulmonary:     Effort: Pulmonary effort is normal.  Musculoskeletal:      Right knee: No swelling. Decreased range of motion. Tenderness present over the medial joint line. Normal pulse.  Skin:    General: Skin is warm and dry.  Neurological:     General: No focal deficit present.     Mental Status: She is alert and oriented to person, place, and time.  Psychiatric:        Mood and Affect: Mood normal.        Behavior: Behavior normal.      UC Treatments / Results  Labs (all labs ordered are listed, but only abnormal results are displayed) Labs Reviewed - No data to display  EKG   Radiology No results found.  Procedures Procedures (including critical care time)  Medications Ordered in UC Medications  ketorolac (TORADOL) 30 MG/ML injection 30 mg (30 mg Intramuscular Given 07/26/23 0914)  dexamethasone (DECADRON) injection 10 mg (10 mg Intramuscular Given 07/26/23 0913)    Initial Impression / Assessment and Plan / UC Course  I have reviewed the triage vital signs and the nursing notes.  Pertinent labs & imaging results that were available during my care of the patient were reviewed by me and considered in my medical decision making (see chart for details).  X-ray of the right knee is  pending.  Patient was given an injection of Decadron 10 mg IM and Toradol 30 mg IM for knee pain.  Due to the chronicity of patient's pain, and her verbalization that she would like to find a new provider, referral was placed for [facility].  Supportive care recommendations were provided and discussed with the patient to include RICE therapy, and continuing current medications she is using for pain.  Patient verbalized understanding.  All questions were answered.  Patient stable for discharge.   Final Clinical Impressions(s) / UC Diagnoses   Final diagnoses:  Chronic pain of right knee     Discharge Instructions      X-ray of the right knee is pending.  You will be contacted if the result of the x-ray is abnormal. You were given injections of  Decadron 10 mg and Toradol 30 mg for pain.  Do not take any additional ibuprofen, Aleve, Motrin, or other NSAIDs today.  You may take Tylenol arthritis strength as needed for breakthrough pain. RICE therapy, rest, ice, compression, and elevation. Try to limit weightbearing is much as possible to help decrease pain. A referral has been placed for you for [facility].  Recommend giving them a call on 07/28/2023 to schedule an appointment. Follow-up as needed.     ED Prescriptions   None    PDMP not reviewed this encounter.   Abran Cantor, NP 07/26/23 [phone]

## 2023-08-05 ENCOUNTER — Encounter: Payer: Self-pay | Admitting: Orthopedic Surgery

## 2023-08-05 ENCOUNTER — Ambulatory Visit (INDEPENDENT_AMBULATORY_CARE_PROVIDER_SITE_OTHER): Payer: 59 | Admitting: Orthopedic Surgery

## 2023-08-05 VITALS — BP 111/73 | HR 89 | Ht 66.0 in | Wt 223.0 lb

## 2023-08-05 DIAGNOSIS — M25561 Pain in right knee: Secondary | ICD-10-CM | POA: Diagnosis not present

## 2023-08-05 DIAGNOSIS — G8929 Other chronic pain: Secondary | ICD-10-CM

## 2023-08-05 NOTE — Patient Instructions (Signed)

## 2023-08-05 NOTE — Progress Notes (Signed)
 New Patient Visit  Assessment: Brooke Eaton is a 47 y.o. female with the following: 1. Chronic pain of right knee  Plan: Brooke Eaton has pain in her right knee.  She has had this for a while, with acute worsening over the past month.  Radiographs demonstrate some degenerative changes.  Concern for instability based on the overall alignment on x-ray, but on physical exam, the knee is stable.  Pain gets worse with valgus stress, but there is no laxity.  She also is tenderness palpation along the medial joint line, as well as in line with the pes tendons.  We discussed proceeding with an injection in clinic today.  This was completed without issues.  She will return to clinic as needed.  If she continues to have catching sensations in the right knee, we may have to consider obtaining an MRI.  Procedure note injection Right knee joint   Verbal consent was obtained to inject the right knee joint  Timeout was completed to confirm the site of injection.  The skin was prepped with alcohol and ethyl chloride was sprayed at the injection site.  A 21-gauge needle was used to inject 40 mg of Depo-Medrol  and 1% lidocaine  (4 cc) into the right knee using an anterolateral approach.  There were no complications. A sterile bandage was applied.   Follow-up: Return if symptoms worsen or fail to improve.  Subjective:  Chief Complaint  Patient presents with   Knee Pain    R knee pain getting worse.     History of Present Illness: Brooke Eaton is a 47 y.o. female who presents for evaluation of right knee pain.  No specific injury.  She has had issues in the right knee for several years.  She had an injection earlier this year, which worsened her symptoms.  Over the past month, the pain has been progressively getting worse.  No specific injury.  She presented to an urgent care center.  She has had IM injections of steroids, without improvement.  She is tried over-the-counter as well as prescription  medications.  She sees a rheumatologist, who has told her that she has arthritis.  No therapy.  She notes that pain radiates from the medial aspect of the knee distally towards her foot.  She has some pain with rotation and flexion of the right knee.   Review of Systems: No fevers or chills No numbness or tingling No chest pain No shortness of breath No bowel or bladder dysfunction No GI distress No headaches   Medical History:  Past Medical History:  Diagnosis Date   Acid reflux    Allergy    Anxiety    Anxiety and depression    Arthritis    Asthma    Depression    Fibroid    Fibromyalgia    Fibromyalgia    Heart murmur    Irritable bowel syndrome    Migraines    Osteitis pubis (HCC)    Plantar fasciitis    PONV (postoperative nausea and vomiting)    Post-traumatic stress disorder    PTSD (post-traumatic stress disorder)    Ruptured cyst of ovary 2001   Ruptured cyst of ovary    Substance abuse [name])     Past Surgical History:  Procedure Laterality Date   ADENOIDECTOMY     BIOPSY  06/02/2023   Procedure: BIOPSY;  Surgeon: Brooke Carlin POUR, DO;  [facility];  Service: Endoscopy;;   COLONOSCOPY N/A 11/07/2016  Surgeon: Brooke LITTIE Haddock, MD; edundant colon, pancolonic diverticulosis, internal hemorrhoids, negative random colon biopsies, recommended repeat in 10 years.   DILATION AND CURETTAGE OF UTERUS     DILITATION & CURRETTAGE/HYSTROSCOPY WITH NOVASURE ABLATION N/A 10/01/2016   Procedure: DILATATION & CURETTAGE/HYSTEROSCOPY WITH NOVASURE ABLATION AND EXCISION OF VULVAR/GLUTEAL  CYST;  Surgeon: Brooke Edsel GAILS, MD;  Location: [facility];  Service: Gynecology;  Laterality: N/A;   ESOPHAGOGASTRODUODENOSCOPY N/A 11/07/2016   Surgeon: Brooke LITTIE Haddock, MD;  nonobstructing Schatzki's ring s/p dilation, mild gastritis biopsied, large duodenal diverticulum involving ampulla, s/p duodenal biopsies.  Duodenal biopsies benign, gastric biopsies with chronic inflammation,  intestinal metaplasia, no H. pylori.   ESOPHAGOGASTRODUODENOSCOPY (EGD) WITH PROPOFOL  N/A 06/02/2023   Procedure: ESOPHAGOGASTRODUODENOSCOPY (EGD) WITH PROPOFOL ;  Surgeon: Brooke Carlin POUR, DO;  [facility];  Service: Endoscopy;  Laterality: N/A;  11:30 am, asa 2   SAVORY DILATION N/A 11/07/2016   Procedure: SAVORY DILATION;  Surgeon: Brooke LITTIE Haddock, MD;  [facility];  Service: Endoscopy;  Laterality: N/A;   TONSILLECTOMY     TUBAL LIGATION  2016   WISDOM TOOTH EXTRACTION      Family History  Problem Relation Age of Onset   Emphysema Maternal Grandmother    Gout Maternal Grandmother    Diabetes Maternal Grandmother    Gout Father    Alcohol abuse Father    Arthritis Father    COPD Father    Depression Father    Drug abuse Father    Hyperlipidemia Father    Hypertension Father    Gout Mother    Arthritis Mother        RA   Diabetes Mother    Depression Mother    Heart disease Mother    Hyperlipidemia Mother    Hypertension Mother    Vision loss Mother    Kidney failure Mother    Thyroid disease Maternal Aunt    Diabetes Maternal Uncle    Breast cancer Paternal Aunt 71   Breast cancer Paternal Aunt 44   Breast cancer Other    Colon cancer Neg Hx    Inflammatory bowel disease Neg Hx    Celiac disease Neg Hx    Social History   Tobacco Use   Smoking status: Never   Smokeless tobacco: Never  Vaping Use   Vaping status: Never Used  Substance Use Topics   Alcohol use: Not Currently    Comment: WINE OCCASIONAL   Drug use: Yes    Types: Marijuana    Comment: daily    Allergies  Allergen Reactions   Doxycycline Nausea And Vomiting    Cannot tolerate oral doxycycline    Flagyl  [Metronidazole ] Itching   Latex Itching   Penicillins Itching    Has patient had a PCN reaction causing immediate rash, facial/tongue/throat swelling, SOB or lightheadedness with hypotension: NO Has patient had a PCN reaction causing severe rash involving mucus  membranes or skin necrosis: No Has patient had a PCN reaction that required hospitalization No Has patient had a PCN reaction occurring within the last 10 years: No If all of the above answers are NO, then may proceed with Cephalosporin use.     Current Meds  Medication Sig   albuterol  (ACCUNEB ) 0.63 MG/3ML nebulizer solution Take 1 ampule by nebulization as needed for wheezing.   ALBUTEROL  IN Inhale into the lungs as needed.   esomeprazole  (NEXIUM ) 40 MG capsule Take 1 capsule (40 mg total) by mouth 2 (two) times daily before  a meal.   meloxicam  (MOBIC ) 7.5 MG tablet Take 1 tablet (7.5 mg total) by mouth daily.   ondansetron  (ZOFRAN -ODT) 4 MG disintegrating tablet Take 1 tablet (4 mg total) by mouth every 8 (eight) hours as needed for nausea or vomiting.   pregabalin (LYRICA) 75 MG capsule Take 150 mg by mouth 3 (three) times daily.    solifenacin (VESICARE) 5 MG tablet Take 5 mg by mouth daily.   sucralfate  (CARAFATE ) 1 g tablet Take 1 tablet (1 g total) by mouth 4 (four) times daily.   venlafaxine XR (EFFEXOR-XR) 150 MG 24 hr capsule Take 150 mg by mouth daily with breakfast. As needed    Objective: BP 111/73   Pulse 89   Ht 5' 6 (1.676 m)   Wt 223 lb (101.2 kg)   LMP 07/07/2023 (Approximate)   BMI 35.99 kg/m   Physical Exam:  General: Alert and oriented. and No acute distress. Gait: Right sided antalgic gait.  Evaluation of the right knee demonstrates no swelling.  No bruising.  No redness.  Tenderness along the medial joint line.  Tenderness in line with the pes tendons.  Reacts with pain with valgus stress, but there is no laxity.  No laxity to varus stress.  Negative Lachman.  She is pregnant range of motion.  IMAGING: I personally reviewed images previously obtained from the ED   Right knee x-rays  IMPRESSION: 1. Question old healed fracture of the femur along the femoral groove. Recommend sunrise view of the knee for further evaluation. 2. No dislocation with  however possible slight lateral subluxation of the tibia in relation to the femur. 3. Tricompartmental moderate degenerative changes of the knee. 4. Small joint effusion. 5.  No acute displaced fracture or frank dislocation.   New Medications:  No orders of the defined types were placed in this encounter.     Oneil DELENA Horde, MD  08/05/2023 3:16 PM

## 2023-10-23 ENCOUNTER — Other Ambulatory Visit (HOSPITAL_COMMUNITY)
Admission: RE | Admit: 2023-10-23 | Discharge: 2023-10-23 | Disposition: A | Discharge status: Home / Self Care | Source: Ambulatory Visit | Attending: Obstetrics and Gynecology | Admitting: Obstetrics and Gynecology

## 2023-10-23 ENCOUNTER — Ambulatory Visit: Admitting: Obstetrics and Gynecology

## 2023-10-23 ENCOUNTER — Encounter: Payer: Self-pay | Admitting: Obstetrics and Gynecology

## 2023-10-23 VITALS — BP 136/88 | HR 82 | Ht 66.0 in | Wt 229.0 lb

## 2023-10-23 DIAGNOSIS — Z113 Encounter for screening for infections with a predominantly sexual mode of transmission: Secondary | ICD-10-CM

## 2023-10-23 DIAGNOSIS — Z01419 Encounter for gynecological examination (general) (routine) without abnormal findings: Secondary | ICD-10-CM

## 2023-10-23 DIAGNOSIS — B9689 Other specified bacterial agents as the cause of diseases classified elsewhere: Secondary | ICD-10-CM | POA: Diagnosis not present

## 2023-10-23 DIAGNOSIS — B3789 Other sites of candidiasis: Secondary | ICD-10-CM

## 2023-10-23 DIAGNOSIS — N760 Acute vaginitis: Secondary | ICD-10-CM | POA: Insufficient documentation

## 2023-10-23 DIAGNOSIS — A5901 Trichomonal vulvovaginitis: Secondary | ICD-10-CM | POA: Insufficient documentation

## 2023-10-23 MED ORDER — NYSTATIN 100000 UNIT/GM EX POWD: 1.0000 | Freq: Three times a day (TID) | CUTANEOUS | 2 refills | Status: DC

## 2023-10-23 NOTE — Progress Notes (Signed)
 ANNUAL EXAM Patient name: Brooke Eaton MRN [id number]  Date of birth: Aug 07, 1975 Chief Complaint:   Gynecologic Exam  History of Present Illness:   Brooke Eaton is a 48 y.o. G57P1021  female being seen today for a routine annual exam.  Current complaints: She has noticed a vaginal odor the since taking abx for her UTI. Reports yeast under breast,  She reports a menstrual every month since December for 2-3 days, very light   Patient's last menstrual period was 09/27/2023.    Last pap 10/18/20. Results were: NILM w/ HRHPV negative. H/O abnormal pap: no Last mammogram: 06/23/2023. Results were: normal. Family h/o breast cancer: no     10/23/2023   10:00 AM 10/18/2020   10:19 AM 01/13/2018    2:12 PM 12/18/2017    2:09 PM 11/14/2017   10:20 AM  Depression screen PHQ 2/9  Decreased Interest 0 0 0 0 2  Down, Depressed, Hopeless 3 2 0 0 1  PHQ - 2 Score 3 2 0 0 3  Altered sleeping 2 1   3   Tired, decreased energy 2 2   3   Change in appetite 1 1   1   Feeling bad or failure about yourself  1 3   3   Trouble concentrating 3 0   1  Moving slowly or fidgety/restless 1 2   1   Suicidal thoughts 0 0   1  PHQ-9 Score 13 11   16   Difficult doing work/chores     Extremely dIfficult        10/23/2023   10:00 AM 10/18/2020   10:19 AM  GAD 7 : Generalized Anxiety Score  Nervous, Anxious, on Edge 2 1  Control/stop worrying 3 1  Worry too much - different things 3 1  Trouble relaxing 1 1  Restless 1 0  Easily annoyed or irritable 3 1  Afraid - awful might happen 3 0  Total GAD 7 Score 16 5     Review of Systems:   Pertinent items are noted in HPI Denies any headaches, blurred vision, fatigue, shortness of breath, chest pain, abdominal pain, abnormal vaginal discharge/itching/odor/irritation, problems with periods, bowel movements, urination, or intercourse unless otherwise stated above. Pertinent History Reviewed:  Reviewed past medical,surgical, social and family history.  Reviewed  problem list, medications and allergies. Physical Assessment:   Vitals:   10/23/23 1001  BP: 136/88  Pulse: 82  Weight: 229 lb (103.9 kg)  Height: 5\' 6"  (1.676 m)  Body mass index is 36.96 kg/m.        Physical Examination:   General appearance - well appearing, and in no distress  Mental status - alert, oriented   Psych:  She has a normal mood and affect  Skin - warm and dry,  Chest - effort normal  Neck:  midline trachea  Breasts - breasts appear normal, no suspicious masses, no skin or nipple changes or  axillary nodes, candida present under breast   Abdomen - soft, nontender  Pelvic - VULVA: normal appearing vulva with no masses, tenderness or lesions  VAGINA: normal appearing vagina with normal color, creamy white discharge noted, no lesions  CERVIX: normal appearing cervix without discharge or lesions, no CMT  UTERUS: uterus is felt to be normal size, shape, consistency and nontender   ADNEXA: No adnexal masses or tenderness noted.  Extremities:  No swelling or varicosities noted  Chaperone present for exam  No results found for this or any previous visit (from the past  24 hours).  Assessment & Plan:  1. Encounter for annual routine gynecological examination (Primary) UTD on mammogram, due next year Pap UTD Follows with PCP  2. Screen for STD (sexually transmitted disease)  - Cervicovaginal ancillary only - RPR+HBsAg+HCVAb+...  3. Candidiasis of breast Nystatin powder sent    Labs/procedures today:   Mammogram: in 1 year, or sooner if problems  Orders Placed This Encounter  Procedures   RPR+HBsAg+HCVAb+...    Meds: No orders of the defined types were placed in this encounter.   Follow-up: Return in about 1 year (around 10/22/2024) for Thayer Jew, FNP

## 2023-10-24 LAB — CERVICOVAGINAL ANCILLARY ONLY
Bacterial Vaginitis (gardnerella): POSITIVE — AB
Candida Glabrata: NEGATIVE
Candida Vaginitis: NEGATIVE
Chlamydia: NEGATIVE
Comment: NEGATIVE
Comment: NEGATIVE
Comment: NEGATIVE
Comment: NEGATIVE
Comment: NEGATIVE
Comment: NORMAL
Neisseria Gonorrhea: NEGATIVE
Trichomonas: POSITIVE — AB

## 2023-10-24 LAB — RPR+HBSAG+HCVAB+...
HIV Screen 4th Generation wRfx: NONREACTIVE
Hep C Virus Ab: NONREACTIVE
Hepatitis B Surface Ag: NEGATIVE
RPR Ser Ql: NONREACTIVE

## 2023-10-28 ENCOUNTER — Other Ambulatory Visit: Payer: Self-pay | Admitting: Obstetrics and Gynecology

## 2023-10-28 MED ORDER — TINIDAZOLE 500 MG PO TABS: 2.0000 g | ORAL_TABLET | Freq: Every day | ORAL | 0 refills | Status: DC

## 2023-11-03 ENCOUNTER — Ambulatory Visit: Admitting: Obstetrics & Gynecology

## 2024-03-01 ENCOUNTER — Ambulatory Visit (INDEPENDENT_AMBULATORY_CARE_PROVIDER_SITE_OTHER): Admitting: Podiatry

## 2024-03-01 ENCOUNTER — Encounter: Payer: Self-pay | Admitting: Podiatry

## 2024-03-01 ENCOUNTER — Ambulatory Visit (INDEPENDENT_AMBULATORY_CARE_PROVIDER_SITE_OTHER)

## 2024-03-01 DIAGNOSIS — M2042 Other hammer toe(s) (acquired), left foot: Secondary | ICD-10-CM | POA: Diagnosis not present

## 2024-03-01 DIAGNOSIS — G5762 Lesion of plantar nerve, left lower limb: Secondary | ICD-10-CM

## 2024-03-01 DIAGNOSIS — D3610 Benign neoplasm of peripheral nerves and autonomic nervous system, unspecified: Secondary | ICD-10-CM

## 2024-03-01 MED ORDER — TRIAMCINOLONE ACETONIDE 10 MG/ML IJ SUSP
10.0000 mg | Freq: Once | INTRAMUSCULAR | Status: AC
  Administered 2024-03-01: 10 mg

## 2024-03-01 NOTE — Progress Notes (Signed)
 Patient presents with complaint of pain in the forefoot on the left.  Indicates area under the second third MTPs.  Has been bothering her for several weeks now and extremely painful.  Other quality injury to the area has not noticed any redness or swelling in the area.  No ecchymosis noted.   Physical exam:  General appearance: Pleasant, and in no acute distress. AOx3.  Vascular: Pedal pulses: DP 2/4 bilaterally, PT 2/4 bilaterally.  Mild edema lower legs bilaterally. Capillary fill time immediate bilaterally.  Neurological: Light touch intact feet bilaterally.  Normal Achilles reflex bilaterally.  No clonus or spasticity noted.  Positive Mulder sign second IMS  Dermatologic:   Skin normal temperature bilaterally.  Skin normal color, tone, and texture bilaterally.   Musculoskeletal: Hammertoes 2 through 5 bilaterally.  Tenderness some tenderness on the second MTP plantarly and plantar laterally.  Some tenderness with range of motion of the second MTP.  Radiographs: 3 views foot left: Mild to moderate hammertoe deformities 2 through 5.  Notes any stress fractures.  Normal joint spaces at lesser MTPs.  No evidence of any dislocations or fractures.  Normal bone density.  Diagnosis: 1.  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 2.  Hammertoes 2 through 5 left  Plan: -Discussed neuroma and etiology and treatment.  May also have a arthralgia of the second MTP.  Symptoms are more consistent with a neuroma -injected 3cc 2:1 mixture 0.5 cc Marcaine :Kenolog 10mg /74ml at neuroma second plantar, digital nerve left foot.    Return 2 weeks follow-up injection of the left second IMS

## 2024-03-15 ENCOUNTER — Ambulatory Visit: Admitting: Podiatry

## 2024-09-01 ENCOUNTER — Ambulatory Visit: Admitting: Podiatry

## 2024-09-01 DIAGNOSIS — L600 Ingrowing nail: Secondary | ICD-10-CM | POA: Diagnosis not present

## 2024-09-01 MED ORDER — SULFAMETHOXAZOLE-TRIMETHOPRIM 800-160 MG PO TABS: 1.0000 | ORAL_TABLET | Freq: Two times a day (BID) | ORAL | 0 refills | Status: AC

## 2024-09-01 NOTE — Patient Instructions (Signed)

## 2024-09-01 NOTE — Progress Notes (Signed)
 Patient complains of painful ingrown hallux nail right.  The left nail bothers her at x 2.. Patient denies fevers, chills, nausea, vomiting.  Objective:  Vitals: Reviewed  General: Well developed, nourished, in no acute distress, alert and oriented x3   Vascular: DP pulse 2/4 bilateral. PT pulse 2 to/4 bilateral.  Mild to moderate edema toe with ingrown nail.  Capillary refill time immediate bilaterally  Dermatology: Erythema, edema, incurvated nail border both borders hallux nail bilaterally with serous drainage . Tenderness present with palpation. Normal skin tone and texture feet with normal hair growth.  Neurological: Grossly intact. Normal reflexes.   Musculoskeletal: Tenderness with palpation of the distal hallux bilaterally. No tenderness or painful ROM at IPJ.  Diagnosis: 1.  Ingrown nail hallux bilaterally  Plan: -discussed etiology and treatment of ingrown nails. Discussed surgical vs conservative treatment.  Will do hallux nail right today and possibly the left in 2 weeks. -Consent signed for appropriate matrixectomy affected nail(s).   Procedure(s):   - Matrixectomy(s) total nail hallux right: Toe anesthetized with 3cc 2:1 mixture 2% Lidocaine  with epinephrine : Sodium Bicarbonate. Surgical site prepped. Digital tourniquet applied.  Avulsion of nail plate. performed. Matrixecomy performed with three 30 second applications of phenol to nail matrix. Site irrigated with alcohol.  Tourniquet released with good vascularity noticed in digit.  Applied triple antibiotic to nailbed and applied gauze and Coban dressing. - Written and oral postoperative instructions given.  -Return for post-op 2 weeks and possibly matrixectomy hallux nail left  J Prentice Binder, DPM

## 2024-09-15 ENCOUNTER — Ambulatory Visit: Admitting: Podiatry
